# Patient Record
Sex: Female | Born: 1950 | Race: White | Hispanic: No | Marital: Married | Smoking: Former smoker
Health system: Southern US, Community
[De-identification: ages and names within clinical notes are randomized; demographics above are authoritative.]

## PROBLEM LIST (undated history)

## (undated) DIAGNOSIS — F329 Major depressive disorder, single episode, unspecified: Secondary | ICD-10-CM

## (undated) DIAGNOSIS — L570 Actinic keratosis: Secondary | ICD-10-CM

## (undated) DIAGNOSIS — C801 Malignant (primary) neoplasm, unspecified: Secondary | ICD-10-CM

## (undated) DIAGNOSIS — E079 Disorder of thyroid, unspecified: Secondary | ICD-10-CM

## (undated) DIAGNOSIS — R7303 Prediabetes: Secondary | ICD-10-CM

## (undated) DIAGNOSIS — K146 Glossodynia: Secondary | ICD-10-CM

## (undated) DIAGNOSIS — I10 Essential (primary) hypertension: Secondary | ICD-10-CM

## (undated) DIAGNOSIS — F419 Anxiety disorder, unspecified: Secondary | ICD-10-CM

## (undated) DIAGNOSIS — C4492 Squamous cell carcinoma of skin, unspecified: Secondary | ICD-10-CM

## (undated) DIAGNOSIS — G4730 Sleep apnea, unspecified: Secondary | ICD-10-CM

## (undated) DIAGNOSIS — K219 Gastro-esophageal reflux disease without esophagitis: Secondary | ICD-10-CM

## (undated) DIAGNOSIS — K589 Irritable bowel syndrome without diarrhea: Secondary | ICD-10-CM

## (undated) DIAGNOSIS — F32A Depression, unspecified: Secondary | ICD-10-CM

## (undated) DIAGNOSIS — I2510 Atherosclerotic heart disease of native coronary artery without angina pectoris: Secondary | ICD-10-CM

## (undated) DIAGNOSIS — C4491 Basal cell carcinoma of skin, unspecified: Secondary | ICD-10-CM

## (undated) DIAGNOSIS — M1990 Unspecified osteoarthritis, unspecified site: Secondary | ICD-10-CM

## (undated) DIAGNOSIS — E785 Hyperlipidemia, unspecified: Secondary | ICD-10-CM

## (undated) DIAGNOSIS — M797 Fibromyalgia: Secondary | ICD-10-CM

## (undated) HISTORY — DX: Basal cell carcinoma of skin, unspecified: C44.91

## (undated) HISTORY — DX: Squamous cell carcinoma of skin, unspecified: C44.92

## (undated) HISTORY — DX: Actinic keratosis: L57.0

## (undated) HISTORY — DX: Disorder of thyroid, unspecified: E07.9

## (undated) HISTORY — DX: Anxiety disorder, unspecified: F41.9

## (undated) HISTORY — DX: Glossodynia: K14.6

## (undated) HISTORY — DX: Depression, unspecified: F32.A

## (undated) HISTORY — DX: Irritable bowel syndrome, unspecified: K58.9

## (undated) HISTORY — DX: Unspecified osteoarthritis, unspecified site: M19.90

## (undated) HISTORY — DX: Hyperlipidemia, unspecified: E78.5

## (undated) HISTORY — DX: Major depressive disorder, single episode, unspecified: F32.9

## (undated) HISTORY — DX: Gastro-esophageal reflux disease without esophagitis: K21.9

## (undated) HISTORY — PX: BREAST BIOPSY: SHX20

## (undated) HISTORY — DX: Malignant (primary) neoplasm, unspecified: C80.1

## (undated) HISTORY — DX: Essential (primary) hypertension: I10

## (undated) HISTORY — DX: Fibromyalgia: M79.7

## (undated) HISTORY — PX: EYE SURGERY: SHX253

## (undated) HISTORY — DX: Atherosclerotic heart disease of native coronary artery without angina pectoris: I25.10

---

## 2016-09-18 DIAGNOSIS — F39 Unspecified mood [affective] disorder: Secondary | ICD-10-CM | POA: Diagnosis not present

## 2016-09-18 DIAGNOSIS — F411 Generalized anxiety disorder: Secondary | ICD-10-CM | POA: Diagnosis not present

## 2016-09-18 DIAGNOSIS — Z79899 Other long term (current) drug therapy: Secondary | ICD-10-CM | POA: Diagnosis not present

## 2016-09-18 DIAGNOSIS — F4312 Post-traumatic stress disorder, chronic: Secondary | ICD-10-CM | POA: Diagnosis not present

## 2016-09-18 DIAGNOSIS — F5105 Insomnia due to other mental disorder: Secondary | ICD-10-CM | POA: Diagnosis not present

## 2016-09-25 DIAGNOSIS — F4312 Post-traumatic stress disorder, chronic: Secondary | ICD-10-CM | POA: Diagnosis not present

## 2016-09-25 DIAGNOSIS — F39 Unspecified mood [affective] disorder: Secondary | ICD-10-CM | POA: Diagnosis not present

## 2016-09-25 DIAGNOSIS — Z79899 Other long term (current) drug therapy: Secondary | ICD-10-CM | POA: Diagnosis not present

## 2016-10-05 DIAGNOSIS — F411 Generalized anxiety disorder: Secondary | ICD-10-CM | POA: Diagnosis not present

## 2016-10-05 DIAGNOSIS — F4312 Post-traumatic stress disorder, chronic: Secondary | ICD-10-CM | POA: Diagnosis not present

## 2016-10-05 DIAGNOSIS — F39 Unspecified mood [affective] disorder: Secondary | ICD-10-CM | POA: Diagnosis not present

## 2016-10-08 DIAGNOSIS — F39 Unspecified mood [affective] disorder: Secondary | ICD-10-CM | POA: Diagnosis not present

## 2016-10-08 DIAGNOSIS — F411 Generalized anxiety disorder: Secondary | ICD-10-CM | POA: Diagnosis not present

## 2016-10-08 DIAGNOSIS — F4312 Post-traumatic stress disorder, chronic: Secondary | ICD-10-CM | POA: Diagnosis not present

## 2016-10-08 DIAGNOSIS — Z79899 Other long term (current) drug therapy: Secondary | ICD-10-CM | POA: Diagnosis not present

## 2016-10-08 DIAGNOSIS — F5105 Insomnia due to other mental disorder: Secondary | ICD-10-CM | POA: Diagnosis not present

## 2016-10-21 DIAGNOSIS — F4312 Post-traumatic stress disorder, chronic: Secondary | ICD-10-CM | POA: Diagnosis not present

## 2016-10-21 DIAGNOSIS — F39 Unspecified mood [affective] disorder: Secondary | ICD-10-CM | POA: Diagnosis not present

## 2016-10-21 DIAGNOSIS — F411 Generalized anxiety disorder: Secondary | ICD-10-CM | POA: Diagnosis not present

## 2016-10-27 ENCOUNTER — Encounter: Payer: Self-pay | Admitting: Family Medicine

## 2016-10-27 ENCOUNTER — Ambulatory Visit (INDEPENDENT_AMBULATORY_CARE_PROVIDER_SITE_OTHER): Payer: Medicare Other | Admitting: Family Medicine

## 2016-10-27 VITALS — BP 102/80 | HR 100 | Temp 98.7°F | Ht 62.0 in | Wt 170.4 lb

## 2016-10-27 DIAGNOSIS — E785 Hyperlipidemia, unspecified: Secondary | ICD-10-CM | POA: Diagnosis not present

## 2016-10-27 DIAGNOSIS — E1169 Type 2 diabetes mellitus with other specified complication: Secondary | ICD-10-CM | POA: Insufficient documentation

## 2016-10-27 DIAGNOSIS — F4310 Post-traumatic stress disorder, unspecified: Secondary | ICD-10-CM | POA: Diagnosis not present

## 2016-10-27 DIAGNOSIS — R946 Abnormal results of thyroid function studies: Secondary | ICD-10-CM

## 2016-10-27 DIAGNOSIS — I10 Essential (primary) hypertension: Secondary | ICD-10-CM | POA: Diagnosis not present

## 2016-10-27 DIAGNOSIS — R7989 Other specified abnormal findings of blood chemistry: Secondary | ICD-10-CM

## 2016-10-27 LAB — T3, FREE: T3, Free: 3.2 pg/mL (ref 2.3–4.2)

## 2016-10-27 LAB — T4, FREE: Free T4: 0.58 ng/dL — ABNORMAL LOW (ref 0.60–1.60)

## 2016-10-27 LAB — TSH: TSH: 2.85 u[IU]/mL (ref 0.35–4.50)

## 2016-10-27 NOTE — Assessment & Plan Note (Signed)
-  Continue Lipitor °

## 2016-10-27 NOTE — Assessment & Plan Note (Signed)
Patient with PTSD, anxiety, and occasional depression. Followed by psychiatry. No SI or HI. She'll continue to follow with psychiatry.

## 2016-10-27 NOTE — Assessment & Plan Note (Signed)
Found on recent lab work. Check labs as outlined below.

## 2016-10-27 NOTE — Assessment & Plan Note (Signed)
At goal. Continue current medications. Recent labs reviewed.

## 2016-10-27 NOTE — Patient Instructions (Signed)
Nice to meet you. We'll check lab work for your thyroid today and contact you with the results. Please continue to follow with Dr. Nicolasa Ducking. When you need refills please let us know.
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
## 2016-10-29 ENCOUNTER — Other Ambulatory Visit: Payer: Self-pay | Admitting: Family Medicine

## 2016-10-29 DIAGNOSIS — R7989 Other specified abnormal findings of blood chemistry: Secondary | ICD-10-CM

## 2016-11-04 DIAGNOSIS — F39 Unspecified mood [affective] disorder: Secondary | ICD-10-CM | POA: Diagnosis not present

## 2016-11-04 DIAGNOSIS — Z79899 Other long term (current) drug therapy: Secondary | ICD-10-CM | POA: Diagnosis not present

## 2016-11-04 DIAGNOSIS — F5105 Insomnia due to other mental disorder: Secondary | ICD-10-CM | POA: Diagnosis not present

## 2016-11-04 DIAGNOSIS — F411 Generalized anxiety disorder: Secondary | ICD-10-CM | POA: Diagnosis not present

## 2016-11-04 DIAGNOSIS — F4312 Post-traumatic stress disorder, chronic: Secondary | ICD-10-CM | POA: Diagnosis not present

## 2016-11-12 ENCOUNTER — Telehealth: Payer: Self-pay | Admitting: Family Medicine

## 2016-11-12 NOTE — Telephone Encounter (Signed)
-----   Message from Chauncey Mann, MD sent at 11/11/2016  7:14 PM EDT ----- I started to titrate Nicole Washington off Klonopin which has gone smoothly as her anxiety is much better controlled on Depakote and Cymbalta. She is not having withdrawl symptoms. I had always thought the Klonopin was for anxiety but she is now telling me it is prescribed for something called "Burning Mouth Syndrome". I am not sure about how this is treated but I feel like it falls out of my specialty. I don't know if ENT is who she should se?Marland Kitchen She says she is not having any anxiety and is stable in terms of her mood. Thought is was best to not be on Klonopin because of age, fall risk and no longer needing it.  I told her that I did not need to know

## 2016-11-12 NOTE — Telephone Encounter (Signed)
Please check with patient to see if she would like a referral to ENT for burning mouth syndrome as outlined in the note from the patient's psychiatrist. Thanks.

## 2016-11-13 ENCOUNTER — Encounter: Payer: Self-pay | Admitting: Family Medicine

## 2016-11-13 ENCOUNTER — Ambulatory Visit (INDEPENDENT_AMBULATORY_CARE_PROVIDER_SITE_OTHER): Payer: Medicare Other | Admitting: Family Medicine

## 2016-11-13 VITALS — BP 120/72 | HR 96 | Temp 98.3°F | Wt 171.2 lb

## 2016-11-13 DIAGNOSIS — K1379 Other lesions of oral mucosa: Secondary | ICD-10-CM | POA: Diagnosis not present

## 2016-11-13 DIAGNOSIS — K146 Glossodynia: Secondary | ICD-10-CM

## 2016-11-13 DIAGNOSIS — G8929 Other chronic pain: Secondary | ICD-10-CM | POA: Diagnosis not present

## 2016-11-13 DIAGNOSIS — M545 Low back pain, unspecified: Secondary | ICD-10-CM

## 2016-11-13 NOTE — Progress Notes (Signed)
  Tommi Rumps, MD Phone: [phone]  Nicole Washington is a 66 y.o. female who presents today for follow-up.  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 An oral surgeon advised her to be on Klonopin for this. This is beneficial. Notes it comes and goes when he wants to. Her psychiatrist has been tapering her off of Klonopin. She has done okay with this though she's had more frequent episodes of burning mouth. The patient initially was taking it once or twice a day very infrequently for the burning mouth syndrome though she started to take it for her anxiety as well.  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 Does have pain in her low back at times. No radiation. No numbness or weakness. No loss of bowel or bladder function. No saddle anesthesia. Has not done physical therapy for this. Has gotten injections previously.  PMH: nonsmoker.   ROS see history of present illness  Objective  Physical Exam Vitals:   11/13/16 1327  BP: 120/72  Pulse: 96  Temp: 98.3 F (36.8 C)  SpO2: 97%    BP Readings from Last 3 Encounters:  11/13/16 120/72  10/27/16 102/80   Wt Readings from Last 3 Encounters:  11/13/16 171 lb 3.2 oz (77.7 kg)  10/27/16 170 lb 6.4 oz (77.3 kg)    Physical Exam  Constitutional: No distress.  HENT:  Mouth/Throat: Oropharynx is clear and moist. No oropharyngeal exudate.  Cardiovascular: Normal rate, regular rhythm and normal heart sounds.   Pulmonary/Chest: Effort normal and breath sounds normal.  Musculoskeletal:  No midline spine tenderness, no midline spine step-off, no muscular back tenderness  Neurological: She is alert.  5/5 strength bilateral quads, hamstrings, plantar flexion, and dorsiflexion, sensation to light touch intact bilateral lower extremities  Skin: She is not diaphoretic.     Assessment/Plan: Please see individual problem list.  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 No prior lab workup.  We'll obtain lab work with her next set of labs in several weeks. She will complete the taper off of Klonopin as prescribed by her psychiatrist and then we will prescribe this for her to use as needed for burning mouth syndrome. She will not use this daily. If she starts to use it daily we will need to consider alternative treatment.  Chronic low back pain Chronic issue. Neurologically intact in lower extremities. Refer to physical therapy.   Orders Placed This Encounter  Procedures  . B12    Standing Status:   Future    Standing Expiration Date:   11/13/2017  . Folate    Standing Status:   Future    Standing Expiration Date:   11/13/2017  . Zinc    Standing Status:   Future    Standing Expiration Date:   11/13/2017  . Ambulatory referral to Physical Therapy    Referral Priority:   Routine    Referral Type:   Physical Medicine    Referral Reason:   Specialty Services Required    Requested Specialty:   Physical Therapy    Number of Visits Requested:   1    Tommi Rumps, MD [place]

## 2016-11-13 NOTE — Assessment & Plan Note (Signed)
Chronic issue. Neurologically intact in lower extremities. Refer to physical therapy.

## 2016-11-13 NOTE — Assessment & Plan Note (Signed)
Chronic history of this. No prior lab workup. We'll obtain lab work with her next set of labs in several weeks. She will complete the taper off of Klonopin as prescribed by her psychiatrist and then we will prescribe this for her to use as needed for burning mouth syndrome. She will not use this daily. If she starts to use it daily we will need to consider alternative treatment.

## 2016-11-13 NOTE — Patient Instructions (Signed)
Nice to see you. Please contact us when you finish the taper and then we will prescribe a prescription for Klonopin to use as needed when your mouth is burning. We'll get lab work when you return to have her thyroid function checked as well.

## 2016-11-13 NOTE — Telephone Encounter (Signed)
Patient does not want to see ent

## 2016-11-16 ENCOUNTER — Telehealth: Payer: Self-pay | Admitting: Family Medicine

## 2016-11-16 DIAGNOSIS — M545 Low back pain: Principal | ICD-10-CM

## 2016-11-16 DIAGNOSIS — G8929 Other chronic pain: Secondary | ICD-10-CM

## 2016-11-16 NOTE — Telephone Encounter (Signed)
Please advise 

## 2016-11-16 NOTE — Telephone Encounter (Signed)
Pt called wanting to see a Orthopedic doctor in [place]. It's the L1-L4 bottom on spine. Referral needed.   Call pt @ [phone]. Thankyou!

## 2016-11-17 NOTE — Telephone Encounter (Signed)
Referral placed.

## 2016-11-18 ENCOUNTER — Ambulatory Visit: Payer: Self-pay | Admitting: Family Medicine

## 2016-11-18 DIAGNOSIS — F4312 Post-traumatic stress disorder, chronic: Secondary | ICD-10-CM | POA: Diagnosis not present

## 2016-11-18 DIAGNOSIS — F411 Generalized anxiety disorder: Secondary | ICD-10-CM | POA: Diagnosis not present

## 2016-11-18 DIAGNOSIS — F39 Unspecified mood [affective] disorder: Secondary | ICD-10-CM | POA: Diagnosis not present

## 2016-11-24 DIAGNOSIS — M545 Low back pain: Secondary | ICD-10-CM | POA: Diagnosis not present

## 2016-12-02 DIAGNOSIS — M545 Low back pain: Secondary | ICD-10-CM | POA: Diagnosis not present

## 2016-12-04 DIAGNOSIS — M545 Low back pain: Secondary | ICD-10-CM | POA: Diagnosis not present

## 2016-12-09 ENCOUNTER — Other Ambulatory Visit (INDEPENDENT_AMBULATORY_CARE_PROVIDER_SITE_OTHER): Payer: Medicare Other

## 2016-12-09 DIAGNOSIS — K146 Glossodynia: Secondary | ICD-10-CM | POA: Diagnosis not present

## 2016-12-09 DIAGNOSIS — K1379 Other lesions of oral mucosa: Secondary | ICD-10-CM | POA: Diagnosis not present

## 2016-12-09 DIAGNOSIS — R7989 Other specified abnormal findings of blood chemistry: Secondary | ICD-10-CM

## 2016-12-09 LAB — T4, FREE: FREE T4: 0.7 ng/dL (ref 0.60–1.60)

## 2016-12-09 LAB — T3, FREE: T3 FREE: 2.9 pg/mL (ref 2.3–4.2)

## 2016-12-09 LAB — VITAMIN B12: VITAMIN B 12: 595 pg/mL (ref 211–911)

## 2016-12-09 LAB — TSH: TSH: 2.14 u[IU]/mL (ref 0.35–4.50)

## 2016-12-09 LAB — FOLATE: FOLATE: 17.4 ng/mL (ref >5.9)

## 2016-12-11 LAB — ZINC: ZINC: 92 ug/dL (ref 60–130)

## 2016-12-14 ENCOUNTER — Encounter: Payer: Self-pay | Admitting: *Deleted

## 2016-12-23 ENCOUNTER — Telehealth: Payer: Self-pay | Admitting: Family Medicine

## 2016-12-23 NOTE — Telephone Encounter (Signed)
Copied from [place] [phone]. Topic: Quick Communication - See Telephone Encounter >> Dec 23, 2016  4:05 PM Boyd Kerbs wrote: CRM for notification. See Telephone encounter for:  pt calling for Lab results. She is in the middle of a move (put in new address) but letter went to old address in [place]. Asking for nurse to call her with results, please  12/23/16.

## 2016-12-24 DIAGNOSIS — F39 Unspecified mood [affective] disorder: Secondary | ICD-10-CM | POA: Diagnosis not present

## 2016-12-24 DIAGNOSIS — F4312 Post-traumatic stress disorder, chronic: Secondary | ICD-10-CM | POA: Diagnosis not present

## 2016-12-24 DIAGNOSIS — F411 Generalized anxiety disorder: Secondary | ICD-10-CM | POA: Diagnosis not present

## 2016-12-24 DIAGNOSIS — F5105 Insomnia due to other mental disorder: Secondary | ICD-10-CM | POA: Diagnosis not present

## 2016-12-25 NOTE — Telephone Encounter (Signed)
Left message to return call 

## 2016-12-28 DIAGNOSIS — F39 Unspecified mood [affective] disorder: Secondary | ICD-10-CM | POA: Diagnosis not present

## 2016-12-28 DIAGNOSIS — F411 Generalized anxiety disorder: Secondary | ICD-10-CM | POA: Diagnosis not present

## 2016-12-28 DIAGNOSIS — F4312 Post-traumatic stress disorder, chronic: Secondary | ICD-10-CM | POA: Diagnosis not present

## 2017-01-14 ENCOUNTER — Telehealth: Payer: Self-pay | Admitting: Family Medicine

## 2017-01-14 MED ORDER — CLONAZEPAM 0.5 MG PO TABS: 0.2500 mg | ORAL_TABLET | Freq: Every day | ORAL | 0 refills | Status: DC | PRN

## 2017-01-14 NOTE — Telephone Encounter (Signed)
Please fax

## 2017-01-14 NOTE — Telephone Encounter (Signed)
Copied from [place]. Topic: General - Other >> Jan 14, 2017 11:02 AM Yvette Rack wrote: Reason for CRM: med refill on Klonopin 0.5mg  [facility] [address] patient has moved back here in October since her house has been built Please take out the [facility] in [place] / [place] 11-13-16 with Dr. Caryl Bis / Request refill on Klonopin / [facility] [address] In [place]

## 2017-01-14 NOTE — Addendum Note (Signed)
Addended by: Leone Haven on: 01/14/2017 04:08 PM   Modules accepted: Orders

## 2017-01-14 NOTE — Telephone Encounter (Signed)
Last OV- 11/13/16 Next OV- 02/02/18 Last refill- 10/27/16  Ok to refill?

## 2017-01-15 NOTE — Telephone Encounter (Signed)
faxed

## 2017-01-18 NOTE — Telephone Encounter (Signed)
Spoke to pharmacist and he verified that patient picked up rx on 01/16/17

## 2017-01-18 NOTE — Telephone Encounter (Signed)
Patient calling back again today and states the pharmacy still hasn't received the fax. The pharmacy recommend for the Dr or CMA to call the pharmacy and speak with a person to get this medication refilled. Phone number [phone]

## 2017-01-18 NOTE — Telephone Encounter (Signed)
Please advise 

## 2017-02-01 DIAGNOSIS — F411 Generalized anxiety disorder: Secondary | ICD-10-CM | POA: Diagnosis not present

## 2017-02-01 DIAGNOSIS — F4312 Post-traumatic stress disorder, chronic: Secondary | ICD-10-CM | POA: Diagnosis not present

## 2017-02-01 DIAGNOSIS — F39 Unspecified mood [affective] disorder: Secondary | ICD-10-CM | POA: Diagnosis not present

## 2017-02-02 ENCOUNTER — Ambulatory Visit (INDEPENDENT_AMBULATORY_CARE_PROVIDER_SITE_OTHER): Payer: Medicare Other | Admitting: Family Medicine

## 2017-02-02 ENCOUNTER — Other Ambulatory Visit: Payer: Self-pay

## 2017-02-02 ENCOUNTER — Encounter: Payer: Self-pay | Admitting: Family Medicine

## 2017-02-02 VITALS — BP 112/80 | HR 93 | Temp 98.0°F | Wt 170.6 lb

## 2017-02-02 DIAGNOSIS — I10 Essential (primary) hypertension: Secondary | ICD-10-CM | POA: Diagnosis not present

## 2017-02-02 DIAGNOSIS — K146 Glossodynia: Secondary | ICD-10-CM | POA: Diagnosis not present

## 2017-02-02 DIAGNOSIS — I2510 Atherosclerotic heart disease of native coronary artery without angina pectoris: Secondary | ICD-10-CM | POA: Diagnosis not present

## 2017-02-02 DIAGNOSIS — Z23 Encounter for immunization: Secondary | ICD-10-CM

## 2017-02-02 DIAGNOSIS — N951 Menopausal and female climacteric states: Secondary | ICD-10-CM | POA: Diagnosis not present

## 2017-02-02 DIAGNOSIS — E785 Hyperlipidemia, unspecified: Secondary | ICD-10-CM

## 2017-02-02 MED ORDER — CLONAZEPAM 0.5 MG PO TABS: 0.7500 mg | ORAL_TABLET | Freq: Every day | ORAL | 0 refills | Status: DC | PRN

## 2017-02-02 NOTE — Patient Instructions (Signed)
Nice to see you. Please start checking your blood pressure daily.  Your goal is 130/80 or less.  You may discontinue the amlodipine though if your blood pressure starts to creep up please restart it.  We will have you return in 2 weeks for recheck. We can trial Klonopin 0.75 mg at a time for your burning mouth syndrome.

## 2017-02-02 NOTE — Assessment & Plan Note (Signed)
Suspect related to menopause.  Prior thyroid function normal.  Discussed trial of medication for this though she declined.  She will monitor.

## 2017-02-02 NOTE — Assessment & Plan Note (Addendum)
Has achieved some level of control with her current dosing of Klonopin as needed.  We will slightly increase the dose to see if this will provide better control.  She will monitor and if not beneficial she will let us know.

## 2017-02-02 NOTE — Assessment & Plan Note (Signed)
Patient states she has a history of CAD.  Asymptomatic.  Currently taking aspirin and Lipitor.  Monitor for symptoms.

## 2017-02-02 NOTE — Assessment & Plan Note (Signed)
Uncontrolled.  Discussed a trial of discontinuing her amlodipine.  She wants to do this in the new year.  She will check her blood pressure while doing this and if it is above a goal of 130/80 consistently she will start back on the amlodipine and let us know.  She will return 2 weeks after discontinuing the amlodipine for BP check with nursing.

## 2017-02-02 NOTE — Progress Notes (Signed)
Tommi Rumps, MD Phone: [phone]  Nicole Washington is a 66 y.o. female who presents today for follow-up.  Hypertension: Not checking at home.  Taking amlodipine, losartan, Coreg.  No chest pain, shortness of breath, or edema.  Notes rare lightheadedness when she goes to stand up.  It occurs infrequently. She is interested in trying to come off of BP medications.   Burning mouth syndrome: Has been diagnosed with this previously.  She notes she was able to start on Klonopin as needed for this.  She still has 1 pill left from when I filled it earlier.  Notes this helps symptoms go away when she takes it or makes it tolerable.  Notes there are some days where it does not bother her at all.  She was previously on a much higher dose.  Describes it as dry mouth and burning sensation. She thinks that she would benefit from a slightly higher dose to help with symptoms.   Hyperlipidemia: Taking Lipitor.  No myalgias.  Occasionally she will get a sharp/dull twinge in her right upper quadrant.  It lasts briefly and goes away on its own.  Occurs about once a week.  Not elicited by food. No other associated symptoms.   Patient is postmenopausal and notes hot flashes.  She notes she seems to run hot most of the time as well.  Prior thyroid function testing was normal.  Social History   Tobacco Use  Smoking Status Never Smoker  Smokeless Tobacco Never Used     ROS see history of present illness  Objective  Physical Exam Vitals:   02/02/17 1354  BP: 112/80  Pulse: 93  Temp: 98 F (36.7 C)  SpO2: 97%   Laying blood pressure 125/85 pulse 88 Sitting blood pressure 124/82 pulse 90 Standing blood pressure 119/81 pulse 92  BP Readings from Last 3 Encounters:  02/02/17 112/80  11/13/16 120/72  10/27/16 102/80   Wt Readings from Last 3 Encounters:  02/02/17 170 lb 9.6 oz (77.4 kg)  11/13/16 171 lb 3.2 oz (77.7 kg)  10/27/16 170 lb 6.4 oz (77.3 kg)    Physical Exam  Constitutional: No  distress.  Cardiovascular: Normal rate, regular rhythm and normal heart sounds.  Pulmonary/Chest: Effort normal and breath sounds normal.  Abdominal: Soft. Bowel sounds are normal. She exhibits no distension. There is no tenderness. There is no rebound and no guarding.  Musculoskeletal: She exhibits no edema.  Neurological: She is alert. Gait normal.  Skin: Skin is warm and dry. She is not diaphoretic.     Assessment/Plan: Please see individual problem list.  Hypertension Uncontrolled.  Discussed a trial of discontinuing her amlodipine.  She wants to do this in the new year.  She will check her blood pressure while doing this and if it is above a goal of 130/80 consistently she will start back on the amlodipine and let us know.  She will return 2 weeks after discontinuing the amlodipine for BP check with nursing.  Burning mouth syndrome Has achieved some level of control with her current dosing of Klonopin as needed.  We will slightly increase the dose to see if this will provide better control.  She will monitor and if not beneficial she will let us know.  Hyperlipidemia She will return for fasting lipid panel.  Rarely has right upper quadrant twinges though has a benign exam.  We will check a CMP as well when she returns for the lipid panel.  If worsen she will be reevaluated sooner.  Hot flashes due to menopause Suspect related to menopause.  Prior thyroid function normal.  Discussed trial of medication for this though she declined.  She will monitor.  CAD (coronary artery disease) Patient states she has a history of CAD.  Asymptomatic.  Currently taking aspirin and Lipitor.  Monitor for symptoms.   Adiah was seen today for follow-up.  Diagnoses and all orders for this visit:  Hyperlipidemia, unspecified hyperlipidemia type -     Comp Met (CMET); Future -     Lipid panel; Future  Encounter for immunization -     Flu vaccine HIGH DOSE PF  Essential hypertension  Burning  mouth syndrome  Hot flashes due to menopause  Coronary artery disease involving native heart without angina pectoris, unspecified vessel or lesion type  Other orders -     clonazePAM (KLONOPIN) 0.5 MG tablet; Take 1.5 tablets (0.75 mg total) by mouth daily as needed (Burning mouth syndrome).    Orders Placed This Encounter  Procedures  . Flu vaccine HIGH DOSE PF  . Comp Met (CMET)    Standing Status:   Future    Standing Expiration Date:   02/02/2018  . Lipid panel    Standing Status:   Future    Standing Expiration Date:   02/02/2018    Meds ordered this encounter  Medications  . clonazePAM (KLONOPIN) 0.5 MG tablet    Sig: Take 1.5 tablets (0.75 mg total) by mouth daily as needed (Burning mouth syndrome).    Dispense:  30 tablet    Refill:  0     Tommi Rumps, MD [place]

## 2017-02-02 NOTE — Assessment & Plan Note (Signed)
She will return for fasting lipid panel.  Rarely has right upper quadrant twinges though has a benign exam.  We will check a CMP as well when she returns for the lipid panel.  If worsen she will be reevaluated sooner.

## 2017-02-17 DIAGNOSIS — F4312 Post-traumatic stress disorder, chronic: Secondary | ICD-10-CM | POA: Diagnosis not present

## 2017-02-17 DIAGNOSIS — F411 Generalized anxiety disorder: Secondary | ICD-10-CM | POA: Diagnosis not present

## 2017-02-17 DIAGNOSIS — F39 Unspecified mood [affective] disorder: Secondary | ICD-10-CM | POA: Diagnosis not present

## 2017-02-17 DIAGNOSIS — Z79899 Other long term (current) drug therapy: Secondary | ICD-10-CM | POA: Diagnosis not present

## 2017-02-17 DIAGNOSIS — F5105 Insomnia due to other mental disorder: Secondary | ICD-10-CM | POA: Diagnosis not present

## 2017-02-22 DIAGNOSIS — F4312 Post-traumatic stress disorder, chronic: Secondary | ICD-10-CM | POA: Diagnosis not present

## 2017-02-22 DIAGNOSIS — F411 Generalized anxiety disorder: Secondary | ICD-10-CM | POA: Diagnosis not present

## 2017-02-22 DIAGNOSIS — F39 Unspecified mood [affective] disorder: Secondary | ICD-10-CM | POA: Diagnosis not present

## 2017-02-26 ENCOUNTER — Encounter: Payer: Self-pay | Admitting: *Deleted

## 2017-02-26 ENCOUNTER — Ambulatory Visit (INDEPENDENT_AMBULATORY_CARE_PROVIDER_SITE_OTHER): Payer: Medicare Other | Admitting: *Deleted

## 2017-02-26 VITALS — BP 128/96 | HR 84 | Resp 18

## 2017-02-26 DIAGNOSIS — I10 Essential (primary) hypertension: Secondary | ICD-10-CM | POA: Diagnosis not present

## 2017-02-26 NOTE — Progress Notes (Signed)
Patient presented for 2 week BP check after stopping amlodipine 5 mg per last OV 02/02/17. BP taken in left arm 128/96 pulse 84. Patient also dropped off home reading given to [place].

## 2017-02-27 NOTE — Progress Notes (Signed)
Blood pressure uncontrolled off of amlodipine.  I would suggest restarting this.  Please also ask the patient to contact her pharmacy to see if the losartan she is on was part of the recent recall.  If it was she should let us know and we can change her medication.  Thanks.

## 2017-03-01 NOTE — Progress Notes (Signed)
Patient notified and voiced understanding to restarting amlodipine and to call ing pharmacy concerning losartan recall.

## 2017-03-08 DIAGNOSIS — F4312 Post-traumatic stress disorder, chronic: Secondary | ICD-10-CM | POA: Diagnosis not present

## 2017-03-08 DIAGNOSIS — Z79899 Other long term (current) drug therapy: Secondary | ICD-10-CM | POA: Diagnosis not present

## 2017-03-08 DIAGNOSIS — F39 Unspecified mood [affective] disorder: Secondary | ICD-10-CM | POA: Diagnosis not present

## 2017-03-08 DIAGNOSIS — F411 Generalized anxiety disorder: Secondary | ICD-10-CM | POA: Diagnosis not present

## 2017-03-08 DIAGNOSIS — F5105 Insomnia due to other mental disorder: Secondary | ICD-10-CM | POA: Diagnosis not present

## 2017-03-15 DIAGNOSIS — F4312 Post-traumatic stress disorder, chronic: Secondary | ICD-10-CM | POA: Diagnosis not present

## 2017-03-15 DIAGNOSIS — F411 Generalized anxiety disorder: Secondary | ICD-10-CM | POA: Diagnosis not present

## 2017-03-15 DIAGNOSIS — F39 Unspecified mood [affective] disorder: Secondary | ICD-10-CM | POA: Diagnosis not present

## 2017-03-26 DIAGNOSIS — M545 Low back pain: Secondary | ICD-10-CM | POA: Diagnosis not present

## 2017-03-26 DIAGNOSIS — G8929 Other chronic pain: Secondary | ICD-10-CM | POA: Diagnosis not present

## 2017-04-14 ENCOUNTER — Other Ambulatory Visit: Payer: Self-pay | Admitting: Family Medicine

## 2017-04-14 NOTE — Telephone Encounter (Signed)
Last OV 02/02/17 last filled 02/02/17 30 0rf Left message to return call to ask patient how many she has left and how often she takes it. [place] for pec to speak with patient

## 2017-04-15 NOTE — Telephone Encounter (Signed)
Patient returned call- she states she has no medication left.  She had her last prescription filled on 12/28 - #30. She only uses it as needed and doesn't take it every day. She states the Rx usually last her 2 months and she is actually having symptoms now that she is out. She would like to have a refill if possible.

## 2017-04-15 NOTE — Telephone Encounter (Signed)
Ok to refill. Please call this in to the pharmacy with the instructions from the prescription that I filled today in the system. Thanks.

## 2017-04-15 NOTE — Telephone Encounter (Signed)
Please advise on refill, I can call in to the pharmacy

## 2017-04-15 NOTE — Telephone Encounter (Signed)
Called into pharmacy

## 2017-04-20 ENCOUNTER — Telehealth: Payer: Self-pay | Admitting: Family Medicine

## 2017-04-20 NOTE — Telephone Encounter (Signed)
Please advise 

## 2017-04-20 NOTE — Telephone Encounter (Signed)
Copied from [place] [id number]. Topic: Quick Communication - See Telephone Encounter >> Apr 20, 2017 12:20 PM Aurelio Brash B wrote: CRM for notification. See Telephone encounter for:  Pt accidentally dropped her clonazePAM (KLONOPIN) 0.5 MG tablets down the sink drain.  She said she was able to retreive 7 of the pills.  If she can get this refilled again  she [facility]  [id number] [address], [place] [phone] (Phone) [phone] (Fax)    04/20/17.

## 2017-04-21 MED ORDER — CLONAZEPAM 0.5 MG PO TABS: ORAL_TABLET | ORAL | 1 refills | Status: DC

## 2017-04-21 NOTE — Telephone Encounter (Signed)
Pt was in the office wondering about her problem with her medication. Please advise.

## 2017-04-21 NOTE — Telephone Encounter (Signed)
Please advise 

## 2017-04-21 NOTE — Telephone Encounter (Signed)
Refill sent to pharmacy.   

## 2017-04-22 ENCOUNTER — Telehealth: Payer: Self-pay | Admitting: Family Medicine

## 2017-04-22 NOTE — Telephone Encounter (Signed)
Informed pharmacist it is ok to fill early

## 2017-04-22 NOTE — Telephone Encounter (Signed)
Copied from [place]. Topic: General - Other >> Apr 22, 2017 11:03 AM Cecelia Byars, NT wrote: Reason for CRM: The patient called and said the pharmacy will not fill her prescription for  clonazePAM (KLONOPIN) 0.5 MG tablet  ,they are saying it is because of the way the prescription was worded and sent in please  call the pharmacy at  [phone] to discuss this matter [facility] [place]

## 2017-05-03 ENCOUNTER — Other Ambulatory Visit: Payer: Self-pay

## 2017-05-03 ENCOUNTER — Ambulatory Visit (INDEPENDENT_AMBULATORY_CARE_PROVIDER_SITE_OTHER): Payer: Medicare Other | Admitting: Family Medicine

## 2017-05-03 ENCOUNTER — Encounter: Payer: Self-pay | Admitting: Family Medicine

## 2017-05-03 VITALS — BP 102/70 | HR 92 | Temp 98.3°F | Wt 172.0 lb

## 2017-05-03 DIAGNOSIS — E785 Hyperlipidemia, unspecified: Secondary | ICD-10-CM | POA: Diagnosis not present

## 2017-05-03 DIAGNOSIS — R253 Fasciculation: Secondary | ICD-10-CM | POA: Diagnosis not present

## 2017-05-03 DIAGNOSIS — Z1231 Encounter for screening mammogram for malignant neoplasm of breast: Secondary | ICD-10-CM | POA: Diagnosis not present

## 2017-05-03 DIAGNOSIS — J309 Allergic rhinitis, unspecified: Secondary | ICD-10-CM | POA: Insufficient documentation

## 2017-05-03 DIAGNOSIS — Z1239 Encounter for other screening for malignant neoplasm of breast: Secondary | ICD-10-CM

## 2017-05-03 DIAGNOSIS — K146 Glossodynia: Secondary | ICD-10-CM | POA: Diagnosis not present

## 2017-05-03 DIAGNOSIS — I10 Essential (primary) hypertension: Secondary | ICD-10-CM

## 2017-05-03 DIAGNOSIS — R413 Other amnesia: Secondary | ICD-10-CM | POA: Insufficient documentation

## 2017-05-03 NOTE — Assessment & Plan Note (Signed)
Well-controlled.  Continue as needed clonazepam. 

## 2017-05-03 NOTE — Patient Instructions (Signed)
Nice to see you. Please monitor your memory.  If it worsens or changes please let us know. We will have you return for lab work.  Please monitor the twitching.  If this continues to occur we could have you see neurology. Please start on Flonase for your postnasal drip and ear discomfort.

## 2017-05-03 NOTE — Assessment & Plan Note (Signed)
Return for fasting lab work.  Continue Lipitor.

## 2017-05-03 NOTE — Assessment & Plan Note (Signed)
Describes twitching intermittently.  Asymptomatic on exam.  Neurologically intact.  Will check electrolytes.  Consider neurology evaluation after electrolyte labs return.

## 2017-05-03 NOTE — Assessment & Plan Note (Signed)
Well-controlled.  She will return for fasting labs.

## 2017-05-03 NOTE — Progress Notes (Signed)
Tommi Rumps, MD Phone: [phone]  Nicole Washington is a 67 y.o. female who presents today for f/u.  HYPERTENSION  Disease Monitoring  Home BP Monitoring not checking Chest pain- no    Dyspnea- no Medications  Compliance-  Taking amlodipine, losartan, coreg.  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  Occasionally takes Klonopin with good benefit.  Hyperlipidemia: Taking Lipitor.  No right upper quadrant pain or myalgias.  She notes some memory issues with forgetting to do certain parts of the laundry and forgetting to take her medications at times.  No ADL or IADL issues.  She had 2 out of 3 word recall on mini cog with normal clock draw.  She reports a postnasal drip with rhinorrhea and left ear fullness at times.  This has been going on for years.  She does use Q-tips to clean her ears.  Some cough in the morning.  Does not consistently use Flonase.  She notes at times she will just twitch.  Can occur during sleep or when she is just sitting there.  Does not occur if she is moving around.  Has been going on for about 6 months.    Social History   Tobacco Use  Smoking Status Never Smoker  Smokeless Tobacco Never Used     ROS see history of present illness  Objective  Physical Exam Vitals:   05/03/17 1348  BP: 102/70  Pulse: 92  Temp: 98.3 F (36.8 C)  SpO2: 96%    BP Readings from Last 3 Encounters:  05/03/17 102/70  02/26/17 (!) 128/96  02/02/17 112/80   Wt Readings from Last 3 Encounters:  05/03/17 172 lb (78 kg)  02/02/17 170 lb 9.6 oz (77.4 kg)  11/13/16 171 lb 3.2 oz (77.7 kg)    Physical Exam  Constitutional: No distress.  HENT:  Mouth/Throat: Oropharynx is clear and moist. No oropharyngeal exudate.  Normal TMs bilaterally, slight abrasion of the inferior portion of the left ear canal  Eyes: Conjunctivae are normal. Pupils are equal, round, and reactive to light.  Cardiovascular: Normal rate, regular rhythm and normal heart sounds.    Pulmonary/Chest: Effort normal and breath sounds normal.  Musculoskeletal: She exhibits no edema.  Neurological: She is alert. Gait normal.  CN 2-12 intact, 5/5 strength in bilateral biceps, triceps, grip, quads, hamstrings, plantar and dorsiflexion, sensation to light touch intact in bilateral UE and LE  Skin: Skin is warm and dry. She is not diaphoretic.     Assessment/Plan: Please see individual problem list.  Hypertension Well-controlled.  She will return for fasting labs.  Burning mouth syndrome Well-controlled.  Continue as needed clonazepam.  Hyperlipidemia Return for fasting lab work.  Continue Lipitor.  Memory difficulty Possible age-related memory issues.  She passed the mini cog.  She will continue to monitor.  If worsen she will let us know.  Twitching Describes twitching intermittently.  Asymptomatic on exam.  Neurologically intact.  Will check electrolytes.  Consider neurology evaluation after electrolyte labs return.  Allergic rhinitis Suspect allergic rhinitis.  Discussed consistent use of Flonase.   Health Maintenance: Mammogram ordered.  She will track down her colonoscopy report for [place].  Orders Placed This Encounter  Procedures  . MM Digital Screening    Standing Status:   Future    Standing Expiration Date:   07/04/2018    Order Specific Question:   Reason for Exam (SYMPTOM  OR DIAGNOSIS REQUIRED)    Answer:   screening    Order Specific Question:  Preferred imaging location?    Answer:   [facility]    No orders of the defined types were placed in this encounter.    Tommi Rumps, MD [place]

## 2017-05-03 NOTE — Assessment & Plan Note (Signed)
Suspect allergic rhinitis.  Discussed consistent use of Flonase.

## 2017-05-03 NOTE — Assessment & Plan Note (Signed)
Possible age-related memory issues.  She passed the mini cog.  She will continue to monitor.  If worsen she will let us know.

## 2017-05-10 DIAGNOSIS — F411 Generalized anxiety disorder: Secondary | ICD-10-CM | POA: Diagnosis not present

## 2017-05-10 DIAGNOSIS — F4312 Post-traumatic stress disorder, chronic: Secondary | ICD-10-CM | POA: Diagnosis not present

## 2017-05-10 DIAGNOSIS — F39 Unspecified mood [affective] disorder: Secondary | ICD-10-CM | POA: Diagnosis not present

## 2017-05-13 ENCOUNTER — Other Ambulatory Visit (INDEPENDENT_AMBULATORY_CARE_PROVIDER_SITE_OTHER): Payer: Medicare Other

## 2017-05-13 DIAGNOSIS — E785 Hyperlipidemia, unspecified: Secondary | ICD-10-CM

## 2017-05-13 LAB — COMPREHENSIVE METABOLIC PANEL
ALBUMIN: 3.9 g/dL (ref 3.5–5.2)
ALT: 24 U/L (ref 0–35)
AST: 21 U/L (ref 0–37)
Alkaline Phosphatase: 77 U/L (ref 39–117)
BILIRUBIN TOTAL: 0.4 mg/dL (ref 0.2–1.2)
BUN: 17 mg/dL (ref 6–23)
CALCIUM: 9 mg/dL (ref 8.4–10.5)
CO2: 31 mEq/L (ref 19–32)
CREATININE: 0.88 mg/dL (ref 0.40–1.20)
Chloride: 100 mEq/L (ref 96–112)
GFR: 67.97 mL/min (ref >60.00)
Glucose, Bld: 106 mg/dL — ABNORMAL HIGH (ref 70–99)
Potassium: 3.7 mEq/L (ref 3.5–5.1)
Sodium: 140 mEq/L (ref 135–145)
Total Protein: 7.1 g/dL (ref 6.0–8.3)

## 2017-05-13 LAB — LIPID PANEL
CHOLESTEROL: 141 mg/dL (ref 0–200)
HDL: 46.3 mg/dL (ref >39.00)
LDL Cholesterol: 61 mg/dL (ref 0–99)
NonHDL: 94.5
TRIGLYCERIDES: 169 mg/dL — AB (ref 0.0–149.0)
Total CHOL/HDL Ratio: 3
VLDL: 33.8 mg/dL (ref 0.0–40.0)

## 2017-05-20 ENCOUNTER — Ambulatory Visit
Admission: RE | Admit: 2017-05-20 | Discharge: 2017-05-20 | Disposition: A | Discharge status: Home / Self Care | Payer: Medicare Other | Source: Ambulatory Visit | Attending: Family Medicine | Admitting: Family Medicine

## 2017-05-20 DIAGNOSIS — Z1231 Encounter for screening mammogram for malignant neoplasm of breast: Secondary | ICD-10-CM | POA: Insufficient documentation

## 2017-05-20 DIAGNOSIS — Z1239 Encounter for other screening for malignant neoplasm of breast: Secondary | ICD-10-CM

## 2017-05-25 ENCOUNTER — Telehealth: Payer: Self-pay

## 2017-05-25 NOTE — Telephone Encounter (Signed)
-----   Message from Leone Haven, MD sent at 05/25/2017 12:43 PM EDT ----- Regarding: mammo results I assume this patients mammogram is still in process because they are awaiting prior records. Please confirm this with [place]. Thanks. Randall Hiss.  ----- Message ----- From: SYSTEM Sent: 05/25/2017  12:07 AM To: Leone Haven, MD

## 2017-05-25 NOTE — Telephone Encounter (Signed)
Noted  

## 2017-05-25 NOTE — Telephone Encounter (Signed)
Nicole Washington is waiting on priors

## 2017-05-26 DIAGNOSIS — H2513 Age-related nuclear cataract, bilateral: Secondary | ICD-10-CM | POA: Diagnosis not present

## 2017-05-28 ENCOUNTER — Other Ambulatory Visit: Payer: Self-pay | Admitting: *Deleted

## 2017-05-28 ENCOUNTER — Inpatient Hospital Stay
Admission: RE | Admit: 2017-05-28 | Discharge: 2017-05-28 | Disposition: A | Discharge status: Home / Self Care | Payer: Self-pay | Source: Ambulatory Visit | Attending: *Deleted | Admitting: *Deleted

## 2017-05-28 DIAGNOSIS — Z9289 Personal history of other medical treatment: Secondary | ICD-10-CM

## 2017-06-14 DIAGNOSIS — F4312 Post-traumatic stress disorder, chronic: Secondary | ICD-10-CM | POA: Diagnosis not present

## 2017-06-14 DIAGNOSIS — F39 Unspecified mood [affective] disorder: Secondary | ICD-10-CM | POA: Diagnosis not present

## 2017-06-14 DIAGNOSIS — F411 Generalized anxiety disorder: Secondary | ICD-10-CM | POA: Diagnosis not present

## 2017-06-25 DIAGNOSIS — F39 Unspecified mood [affective] disorder: Secondary | ICD-10-CM | POA: Diagnosis not present

## 2017-06-25 DIAGNOSIS — F4312 Post-traumatic stress disorder, chronic: Secondary | ICD-10-CM | POA: Diagnosis not present

## 2017-06-25 DIAGNOSIS — Z79899 Other long term (current) drug therapy: Secondary | ICD-10-CM | POA: Diagnosis not present

## 2017-06-25 DIAGNOSIS — F5105 Insomnia due to other mental disorder: Secondary | ICD-10-CM | POA: Diagnosis not present

## 2017-06-25 DIAGNOSIS — F411 Generalized anxiety disorder: Secondary | ICD-10-CM | POA: Diagnosis not present

## 2017-10-09 ENCOUNTER — Other Ambulatory Visit: Payer: Self-pay | Admitting: Family Medicine

## 2017-10-12 NOTE — Telephone Encounter (Signed)
Last OV 05/03/17 last filled 04/21/17 30 1rf

## 2017-10-13 NOTE — Telephone Encounter (Signed)
Controlled substance database reviewed. Sent to pharmacy.   

## 2017-10-27 DIAGNOSIS — F411 Generalized anxiety disorder: Secondary | ICD-10-CM | POA: Diagnosis not present

## 2017-10-27 DIAGNOSIS — Z79899 Other long term (current) drug therapy: Secondary | ICD-10-CM | POA: Diagnosis not present

## 2017-10-27 DIAGNOSIS — F5105 Insomnia due to other mental disorder: Secondary | ICD-10-CM | POA: Diagnosis not present

## 2017-10-27 DIAGNOSIS — F4312 Post-traumatic stress disorder, chronic: Secondary | ICD-10-CM | POA: Diagnosis not present

## 2017-10-27 DIAGNOSIS — F39 Unspecified mood [affective] disorder: Secondary | ICD-10-CM | POA: Diagnosis not present

## 2017-11-03 ENCOUNTER — Ambulatory Visit (INDEPENDENT_AMBULATORY_CARE_PROVIDER_SITE_OTHER): Payer: Medicare Other | Admitting: Family Medicine

## 2017-11-03 VITALS — BP 128/78 | HR 89 | Temp 98.2°F | Resp 18 | Wt 183.0 lb

## 2017-11-03 DIAGNOSIS — K146 Glossodynia: Secondary | ICD-10-CM

## 2017-11-03 DIAGNOSIS — R253 Fasciculation: Secondary | ICD-10-CM

## 2017-11-03 DIAGNOSIS — R251 Tremor, unspecified: Secondary | ICD-10-CM | POA: Diagnosis not present

## 2017-11-03 DIAGNOSIS — Z1159 Encounter for screening for other viral diseases: Secondary | ICD-10-CM | POA: Diagnosis not present

## 2017-11-03 DIAGNOSIS — I10 Essential (primary) hypertension: Secondary | ICD-10-CM

## 2017-11-03 DIAGNOSIS — Z23 Encounter for immunization: Secondary | ICD-10-CM | POA: Diagnosis not present

## 2017-11-03 DIAGNOSIS — M6790 Unspecified disorder of synovium and tendon, unspecified site: Secondary | ICD-10-CM

## 2017-11-03 LAB — BASIC METABOLIC PANEL
BUN: 21 mg/dL (ref 6–23)
CALCIUM: 9.3 mg/dL (ref 8.4–10.5)
CO2: 32 mEq/L (ref 19–32)
CREATININE: 0.81 mg/dL (ref 0.40–1.20)
Chloride: 99 mEq/L (ref 96–112)
GFR: 74.69 mL/min (ref >60.00)
Glucose, Bld: 91 mg/dL (ref 70–99)
Potassium: 4.6 mEq/L (ref 3.5–5.1)
SODIUM: 138 meq/L (ref 135–145)

## 2017-11-03 LAB — TSH: TSH: 2.83 u[IU]/mL (ref 0.35–4.50)

## 2017-11-03 NOTE — Patient Instructions (Signed)
Nice to see you. We will refer you to sports medicine.  We will get lab work today. I am going to send a message to your psychiatrist and put on your tremors to see if your medication can be contributing.  If you do not hear anything from [place] by early next week regarding this please call us.

## 2017-11-03 NOTE — Progress Notes (Signed)
pt

## 2017-11-04 ENCOUNTER — Encounter: Payer: Self-pay | Admitting: Family Medicine

## 2017-11-04 DIAGNOSIS — M6790 Unspecified disorder of synovium and tendon, unspecified site: Secondary | ICD-10-CM | POA: Insufficient documentation

## 2017-11-04 LAB — HEPATITIS C ANTIBODY
Hepatitis C Ab: NONREACTIVE
SIGNAL TO CUT-OFF: 0.02 (ref <1.00)

## 2017-11-04 NOTE — Assessment & Plan Note (Signed)
Adequately controlled.  Continue current regimen. 

## 2017-11-04 NOTE — Assessment & Plan Note (Signed)
Refer to sports medicine to consider ultrasound.

## 2017-11-04 NOTE — Progress Notes (Signed)
Tommi Rumps, MD Phone: [phone]  Nicole Washington is a 67 y.o. female who presents today for f/u.  CC: Burning mouth syndrome, hypertension, tremor, lumps in palms  Mouth syndrome: Klonopin as needed has been helpful.  It does not make her drowsy.  No alcohol use with the Klonopin.  Symptoms have been well controlled.  Hypertension: Was 114/80 at psychiatry.  Taking amlodipine, carvedilol, losartan.  No chest pain, shortness of breath, or edema.  Patient has noted lumps in her bilateral palms over the last several years.  Occasionally there will be discomfort if she hits them.  They have not gotten bigger.  There is no catching with flexion of her fingers.  Tremor: Patient notes over the last few months she has noticed that her hands will tremor if she is trying to do something with them.  Typically occurs if she is trying to eat or hold a cup.  Has not worsened.  No new medication changes.  She does note the twitching she was having previously is still occurring though has not worsened.  Describes it as her leg just moving.  Social History   Tobacco Use  Smoking Status Never Smoker  Smokeless Tobacco Never Used     ROS see history of present illness  Objective  Physical Exam Vitals:   11/03/17 1320  BP: 128/78  Pulse: 89  Resp: 18  Temp: 98.2 F (36.8 C)  SpO2: 96%    BP Readings from Last 3 Encounters:  11/03/17 128/78  05/03/17 102/70  02/26/17 (!) 128/96   Wt Readings from Last 3 Encounters:  11/03/17 183 lb (83 kg)  05/03/17 172 lb (78 kg)  02/02/17 170 lb 9.6 oz (77.4 kg)    Physical Exam  Constitutional: No distress.  Cardiovascular: Normal rate, regular rhythm and normal heart sounds.  Pulmonary/Chest: Effort normal and breath sounds normal.  Musculoskeletal: She exhibits no edema.  Single nodule noted in bilateral palms over the flexor tendon of the fourth finger, no significant tenderness, no catching of the finger on flexion  Neurological: She is  alert.  CN 2-12 intact, 5/5 strength in bilateral biceps, triceps, grip, quads, hamstrings, plantar and dorsiflexion, sensation to light touch intact in bilateral UE and LE, normal gait, normal finger-to-nose, normal rapid alternating movements  Skin: Skin is warm and dry. She is not diaphoretic.     Assessment/Plan: Please see individual problem list.  Hypertension Adequately controlled.  Continue current regimen.  Burning mouth syndrome Adequately controlled.  Continue current regimen.  Tendon nodule Refer to sports medicine to consider ultrasound.  Twitching Patient continues to have this issue and has also had some tremor.  The tremor may be benign essential tremor.  It may be medication side effect.  I will send a message to her psychiatrist to get her input as it appears some of her psychiatric medications have these listed as side effects.  I advised the patient to contact us if she has not heard anything regarding this within the next week.  If the psychiatrist does not feel its medication related we will consider neurology referral.    Orders Placed This Encounter  Procedures  . Pneumococcal conjugate vaccine 13-valent  . Flu Vaccine QUAD 6+ mos PF IM (Fluarix Quad PF)  . TSH  . Hepatitis C Antibody  . Basic Metabolic Panel (BMET)  . Ambulatory referral to Sports Medicine    Referral Priority:   Routine    Referral Type:   Consultation    Number of Visits  Requested:   1    No orders of the defined types were placed in this encounter.    Tommi Rumps, MD [place]

## 2017-11-04 NOTE — Assessment & Plan Note (Signed)
Patient continues to have this issue and has also had some tremor.  The tremor may be benign essential tremor.  It may be medication side effect.  I will send a message to her psychiatrist to get her input as it appears some of her psychiatric medications have these listed as side effects.  I advised the patient to contact us if she has not heard anything regarding this within the next week.  If the psychiatrist does not feel its medication related we will consider neurology referral.

## 2017-11-08 ENCOUNTER — Telehealth: Payer: Self-pay | Admitting: Family Medicine

## 2017-11-08 NOTE — Telephone Encounter (Signed)
Called and spoke with pt. Pt advised and voiced understanding.  

## 2017-11-08 NOTE — Telephone Encounter (Signed)
-----   Message from Chauncey Mann, MD sent at 11/08/2017  7:33 AM EDT ----- Regarding: RE: Possible medication side effects   Thank you. I will followup with her.  ----- Message ----- From: Leone Haven, MD Sent: 11/04/2017   8:23 AM EDT To: Chauncey Mann, MD Subject: Possible medication side effects               Hi Dr Nicolasa Ducking,   I saw Mrs Nicole Washington yesterday for follow-up. She noted several months of a fine tremor when she is moving her hands while holding something. She noted no resting symptoms. It seems consistent with essential tremor. She has also noted issues with limb twitching where one of her limbs will just move for no reason. She has no apparent neurological abnormalities other than a fine intentional tremor. Looking at her depakote and Seroquel it looks like these could be potential side effects of those medications. I wanted to get your input to see if you thought her medications could be playing a role. Thanks for your help.  Randall Hiss

## 2017-11-08 NOTE — Telephone Encounter (Signed)
Please let the patient know that I heard back from her psychiatrist and she should be following up with the patient regarding her tremor as a possible side effect of medications. Thanks.

## 2017-11-10 DIAGNOSIS — F5105 Insomnia due to other mental disorder: Secondary | ICD-10-CM | POA: Diagnosis not present

## 2017-11-10 DIAGNOSIS — F39 Unspecified mood [affective] disorder: Secondary | ICD-10-CM | POA: Diagnosis not present

## 2017-11-10 DIAGNOSIS — F4312 Post-traumatic stress disorder, chronic: Secondary | ICD-10-CM | POA: Diagnosis not present

## 2017-11-10 DIAGNOSIS — F411 Generalized anxiety disorder: Secondary | ICD-10-CM | POA: Diagnosis not present

## 2017-11-14 NOTE — Progress Notes (Signed)
[facility] [address], [place] [address] Phone: [phone] Subjective:    I Nicole Washington am serving as a Education administrator for Dr. Hulan Saas.   I'm seeing this patient by the request  of:  Leone Haven, MD   CC: Bilateral hand pain  KKX:[id number]  Caylen Vanderweele is a 67 y.o. female coming in with complaint of bilateral hand pain. Has lumps in the palms of her hand. TTP. Right hand is worse than left. No numbness and tingling noted.   Onset- Chronic Location- Palm Character- Sharp Aggravating factors- TTP Reliving factors-not using the hands Therapies tried-nothing so far Severity-only with trying to grip something in 8 out of 10     Past Medical History:  Diagnosis Date  . CAD (coronary artery disease)   . Cancer ([place])    skin  . Depression   . GERD (gastroesophageal reflux disease)   . Hyperlipidemia   . Hypertension   . Thyroid disease    Past Surgical History:  Procedure Laterality Date  . BREAST BIOPSY Right    Neg  . CESAREAN SECTION     Social History   Socioeconomic History  . Marital status: Married    Spouse name: Not on file  . Number of children: Not on file  . Years of education: Not on file  . Highest education level: Not on file  Occupational History  . Not on file  Social Needs  . Financial resource strain: Not on file  . Food insecurity:    Worry: Not on file    Inability: Not on file  . Transportation needs:    Medical: Not on file    Non-medical: Not on file  Tobacco Use  . Smoking status: Never Smoker  . Smokeless tobacco: Never Used  Substance and Sexual Activity  . Alcohol use: Yes  . Drug use: No  . Sexual activity: Not on file  Lifestyle  . Physical activity:    Days per week: Not on file    Minutes per session: Not on file  . Stress: Not on file  Relationships  . Social connections:    Talks on phone: Not on file    Gets together: Not on file    Attends religious service: Not  on file    Active member of club or organization: Not on file    Attends meetings of clubs or organizations: Not on file    Relationship status: Not on file  Other Topics Concern  . Not on file  Social History Narrative  . Not on file   Allergies  Allergen Reactions  . Codeine Itching  . Tylenol [Acetaminophen] Itching   Family History  Problem Relation Age of Onset  . Sudden death Mother   . Breast cancer Neg Hx      Current Outpatient Medications (Cardiovascular):  .  amLODipine (NORVASC) 5 MG tablet, Take 5 mg by mouth daily. Marland Kitchen  atorvastatin (LIPITOR) 40 MG tablet, Take 40 mg by mouth daily. .  carvedilol (COREG) 3.125 MG tablet, Take 3.125 mg by mouth 2 (two) times daily with a meal. .  losartan (COZAAR) 25 MG tablet, Take 25 mg by mouth daily.   Current Outpatient Medications (Analgesics):  .  aspirin EC 81 MG tablet, Take 81 mg by mouth daily.   Current Outpatient Medications (Other):  .  clonazePAM (KLONOPIN) 0.5 MG tablet, TAKE 1.5 TABLETS BY MOUTH DAILY AS NEEDED .  divalproex (DEPAKOTE) 500 MG DR  tablet, Take 1,000 mg by mouth at bedtime. .  DULoxetine (CYMBALTA) 60 MG capsule, Take 60 mg by mouth 2 (two) times daily. .  QUEtiapine (SEROQUEL) 50 MG tablet, Take 50 mg by mouth at bedtime. .  ranitidine (ZANTAC) 300 MG tablet, Take 300 mg by mouth at bedtime.    Past medical history, social, surgical and family history all reviewed in electronic medical record.  No pertanent information unless stated regarding to the chief complaint.   Review of Systems:  No headache, visual changes, nausea, vomiting, diarrhea, constipation, dizziness, abdominal pain, skin rash, fevers, chills, night sweats, weight loss, swollen lymph nodes, body aches, joint swelling, muscle aches, chest pain, shortness of breath, mood changes.   Objective  Blood pressure 102/62, pulse 91, height 5\' 2"  (1.575 m), SpO2 95 %.    General: No apparent distress alert and oriented x3 mood and  affect normal, dressed appropriately.  HEENT: Pupils equal, extraocular movements intact  Respiratory: Patient's speak in full sentences and does not appear short of breath  Cardiovascular: No lower extremity edema, non tender, no erythema  Skin: Warm dry intact with no signs of infection or rash on extremities or on axial skeleton.  Abdomen: Soft nontender  Neuro: Cranial nerves II through XII are intact, neurovascularly intact in all extremities with 2+ DTRs and 2+ pulses.  Lymph: No lymphadenopathy of posterior or anterior cervical chain or axillae bilaterally.  Gait normal with good balance and coordination.  MSK:  tender with full range of motion and good stability and symmetric strength and tone of shoulders, elbows, wrist, hip, knee and ankles bilaterally.  Mild arthritic changes of multiple joints And exam shows the patient does have a nodule noted over the A2 pulley along the ring finger of the right hand and a very small one on the left hand.  No triggering noted.  Does have an overlying cystic mass also noted over the fourth finger.  Limited musculoskeletal ultrasound was performed and interpreted by Lyndal Pulley  Limited ultrasound shows that patient has had a trigger nodules noted over the flexor tendon sheath bilaterally right greater than left.  Patient also has a superficial cystic area above the trigger nodule on the right hand.  No abnormal vascularity     Impression and Recommendations:     This case required medical decision making of moderate complexity. The above documentation has been reviewed and is accurate and complete Lyndal Pulley, DO       Note: This dictation was prepared with Dragon dictation along with smaller phrase technology. Any transcriptional errors that result from this process are unintentional.

## 2017-11-15 ENCOUNTER — Ambulatory Visit: Payer: Self-pay

## 2017-11-15 ENCOUNTER — Encounter: Payer: Self-pay | Admitting: Family Medicine

## 2017-11-15 ENCOUNTER — Ambulatory Visit (INDEPENDENT_AMBULATORY_CARE_PROVIDER_SITE_OTHER): Payer: Medicare Other | Admitting: Family Medicine

## 2017-11-15 VITALS — BP 102/62 | HR 91 | Ht 62.0 in

## 2017-11-15 DIAGNOSIS — M79641 Pain in right hand: Secondary | ICD-10-CM | POA: Diagnosis not present

## 2017-11-15 DIAGNOSIS — M79642 Pain in left hand: Secondary | ICD-10-CM

## 2017-11-15 DIAGNOSIS — M6790 Unspecified disorder of synovium and tendon, unspecified site: Secondary | ICD-10-CM

## 2017-11-15 NOTE — Assessment & Plan Note (Signed)
Bilateral.  We discussed icing regimen.  Discussed icing regimen and home exercises.  Discussed bracing and massage.  Worsening symptoms consider injection.  Follow-up 6 weeks

## 2017-11-15 NOTE — Patient Instructions (Signed)
  Good to see you  Ice is your friend Ice 20 minutes 2 times daily. Usually after activity and before bed. pennsaid pinkie amount topically 2 times daily as needed.  Wear braces at night Stay active See me again in 6 weeks just in case or if you want to try injection

## 2017-11-22 DIAGNOSIS — L821 Other seborrheic keratosis: Secondary | ICD-10-CM | POA: Diagnosis not present

## 2017-11-22 DIAGNOSIS — L814 Other melanin hyperpigmentation: Secondary | ICD-10-CM | POA: Diagnosis not present

## 2017-11-22 DIAGNOSIS — L853 Xerosis cutis: Secondary | ICD-10-CM | POA: Diagnosis not present

## 2017-11-22 DIAGNOSIS — Z85828 Personal history of other malignant neoplasm of skin: Secondary | ICD-10-CM | POA: Diagnosis not present

## 2017-11-22 DIAGNOSIS — I781 Nevus, non-neoplastic: Secondary | ICD-10-CM | POA: Diagnosis not present

## 2017-11-22 DIAGNOSIS — L820 Inflamed seborrheic keratosis: Secondary | ICD-10-CM | POA: Diagnosis not present

## 2017-12-02 DIAGNOSIS — Z79899 Other long term (current) drug therapy: Secondary | ICD-10-CM | POA: Diagnosis not present

## 2017-12-02 DIAGNOSIS — F5105 Insomnia due to other mental disorder: Secondary | ICD-10-CM | POA: Diagnosis not present

## 2017-12-02 DIAGNOSIS — F411 Generalized anxiety disorder: Secondary | ICD-10-CM | POA: Diagnosis not present

## 2017-12-02 DIAGNOSIS — F39 Unspecified mood [affective] disorder: Secondary | ICD-10-CM | POA: Diagnosis not present

## 2017-12-02 DIAGNOSIS — F4312 Post-traumatic stress disorder, chronic: Secondary | ICD-10-CM | POA: Diagnosis not present

## 2017-12-04 ENCOUNTER — Telehealth: Payer: Self-pay | Admitting: Family Medicine

## 2017-12-04 DIAGNOSIS — R253 Fasciculation: Secondary | ICD-10-CM

## 2017-12-04 NOTE — Telephone Encounter (Signed)
Please of the patient know that I heard from Dr. Jake Michaelis regarding their medication trials.  We will place a referral for the patient to see neurology for evaluation.  Thanks.

## 2017-12-04 NOTE — Telephone Encounter (Signed)
-----   Message from Chauncey Mann, MD sent at 12/02/2017  2:39 PM EDT ----- UPDATE:  So we did 2 independent trials to differentiate which of 2 meds could be contributing to tremors:  1) 1week off Depakote and still taking Seroquel - tremors still present 2) 1 week of taking Seroquel but off the Depakote - tremors still present  She is still having muscle twitches and tremors. I think neuro might be a good idea. She said you mentioned this.  Cephus Shelling

## 2017-12-07 NOTE — Telephone Encounter (Signed)
Called pt and left a detailed VM. CRM created and sent to [facility] pool.  

## 2017-12-08 ENCOUNTER — Encounter: Payer: Self-pay | Admitting: Neurology

## 2017-12-09 NOTE — Telephone Encounter (Signed)
Called and spoke with pt. Pt advised and voiced understanding. Pt stated that she has already heard back from Neurology for an appt.

## 2017-12-17 NOTE — Progress Notes (Signed)
Subjective:   Nicole Washington was seen in consultation in the movement disorder clinic at the request of Leone Haven, MD.  The evaluation is for tremor.  Tremor started approximately 6 months ago and involves "everything."  She describes some tremor in the hands but then also describes a jerking/twitching that is separate.  Twitches can occur in arms/legs/feet.    First mention twitching in her March, 2019 visit with her primary care physician   Tremor is most noticeable when using the hands.   There is a family hx of tremor in a maternal uncle, with PD.    Affected by caffeine:  No. (1-2 cups coffee/day) Affected by alcohol:   (drinks 1 glass wine q 6 months so unknown) Affected by stress:  Yes.   Affected by fatigue:  No. Spills soup if on spoon:  Yes.   Spills glass of liquid if full:  No. Affects ADL's (tying shoes, brushing teeth, etc):  No.  Current medications that may exacerbate tremor:  Depakote. Pt has been on VPA for 1 year Patient has tried to go off of Depakote for a week and did not seem to change tremor.  She then tried to go off of Seroquel for a week and that did not seem to change tremor.  Outside reports reviewed: lab reports, office notes, radiology reports and referral letter/letters.   Allergies  Allergen Reactions  . Codeine Itching  . Tylenol [Acetaminophen] Itching    Outpatient Encounter Medications as of 12/20/2017  Medication Sig  . amLODipine (NORVASC) 5 MG tablet Take 5 mg by mouth daily.  Marland Kitchen aspirin EC 81 MG tablet Take 81 mg by mouth daily.  Marland Kitchen atorvastatin (LIPITOR) 40 MG tablet Take 40 mg by mouth daily.  . carvedilol (COREG) 3.125 MG tablet Take 3.125 mg by mouth 2 (two) times daily with a meal.  . clonazePAM (KLONOPIN) 0.5 MG tablet TAKE 1.5 TABLETS BY MOUTH DAILY AS NEEDED (Patient taking differently: Take 0.5 mg by mouth as needed. )  . divalproex (DEPAKOTE) 500 MG DR tablet Take 1,000 mg by mouth at bedtime.  . DULoxetine (CYMBALTA) 60 MG  capsule Take 60 mg by mouth 2 (two) times daily.  Marland Kitchen losartan (COZAAR) 25 MG tablet Take 25 mg by mouth daily.  . QUEtiapine (SEROQUEL) 25 MG tablet Take 37.5 mg by mouth at bedtime.  . ranitidine (ZANTAC) 300 MG tablet Take 300 mg by mouth at bedtime.  . [DISCONTINUED] QUEtiapine (SEROQUEL) 50 MG tablet Take 50 mg by mouth at bedtime.   No facility-administered encounter medications on file as of 12/20/2017.     Past Medical History:  Diagnosis Date  . Burning mouth syndrome   . CAD (coronary artery disease)   . Cancer (HCC)    basal and squamous cell  . Depression   . GERD (gastroesophageal reflux disease)   . Hyperlipidemia   . Hypertension   . Thyroid disease     Past Surgical History:  Procedure Laterality Date  . BREAST BIOPSY Right    Neg  . CESAREAN SECTION      Social History   Socioeconomic History  . Marital status: Married    Spouse name: Not on file  . Number of children: Not on file  . Years of education: Not on file  . Highest education level: Not on file  Occupational History  . Not on file  Social Needs  . Financial resource strain: Not on file  . Food insecurity:    Worry:  Not on file    Inability: Not on file  . Transportation needs:    Medical: Not on file    Non-medical: Not on file  Tobacco Use  . Smoking status: Former Smoker    Last attempt to quit: 12/20/1989    Years since quitting: 28.0  . Smokeless tobacco: Never Used  Substance and Sexual Activity  . Alcohol use: Yes    Comment: 4 times a year  . Drug use: No  . Sexual activity: Not on file  Lifestyle  . Physical activity:    Days per week: Not on file    Minutes per session: Not on file  . Stress: Not on file  Relationships  . Social connections:    Talks on phone: Not on file    Gets together: Not on file    Attends religious service: Not on file    Active member of club or organization: Not on file    Attends meetings of clubs or organizations: Not on file     Relationship status: Not on file  . Intimate partner violence:    Fear of current or ex partner: Not on file    Emotionally abused: Not on file    Physically abused: Not on file    Forced sexual activity: Not on file  Other Topics Concern  . Not on file  Social History Narrative  . Not on file    Family Status  Relation Name Status  . Mother  Deceased  . Father  Deceased  . Sister  Alive  . Brother 3 Alive  . Child 2 Alive  . Neg Hx  (Not Specified)    Review of Systems Review of Systems  Constitutional: Negative.   HENT: Negative.   Eyes: Negative.   Respiratory: Negative.   Cardiovascular: Negative.   Gastrointestinal: Negative.   Genitourinary: Negative.   Musculoskeletal: Negative.   Skin: Negative.   Neurological: Negative.   Endo/Heme/Allergies: Negative.      Objective:   VITALS:   Vitals:   12/20/17 1243  BP: 124/76  Pulse: 76  SpO2: 97%  Weight: 184 lb (83.5 kg)  Height: 5\' 2"  (1.575 m)   Gen:  Appears stated age and in NAD. HEENT:  Normocephalic, atraumatic. The mucous membranes are moist. The superficial temporal arteries are without ropiness or tenderness. Cardiovascular: Regular rate and rhythm. Lungs: Clear to auscultation bilaterally. Neck: There are no carotid bruits noted bilaterally.  NEUROLOGICAL:  Orientation:  The patient is alert and oriented x 3.  Recent and remote memory are intact.  Attention span and concentration are normal.  Able to name objects and repeat without trouble.  Fund of knowledge is appropriate Cranial nerves: There is good facial symmetry. The pupils are equal round and reactive to light bilaterally. Fundoscopic exam reveals clear disc margins bilaterally. Extraocular muscles are intact and visual fields are full to confrontational testing. Speech is fluent and clear. Soft palate rises symmetrically and there is no tongue deviation. Hearing is intact to conversational tone. Tone: Tone is good throughout. Sensation:  Sensation is intact to light touch and pinprick throughout (facial, trunk, extremities). Vibration is intact at the bilateral big toe but slightly decreased. There is no extinction with double simultaneous stimulation. There is no sensory dermatomal level identified. Coordination:  The patient has no dysdiadichokinesia or dysmetria. Motor: Strength is 5/5 in the bilateral upper and lower extremities.  Shoulder shrug is equal bilaterally.  There is no pronator drift.  There are no fasciculations  noted. DTR's: Deep tendon reflexes are 2/4 at the bilateral biceps, triceps, brachioradialis, 0-1 at the bilateral patella and achilles.  Plantar responses are downgoing bilaterally. Gait and Station: The patient is able to ambulate without difficulty. The patient is able to heel toe walk without any difficulty. The patient is able to ambulate in a tandem fashion. The patient is able to stand in the Romberg position.   MOVEMENT EXAM: Tremor:  There is no rest tremor.  There is no significant intention tremor.  There is mild postural tremor on the right.  This increases when given a weight.  She has no significant trouble with Archimedes spirals, but minor tremor is evident.  She is able to pour water from one glass to another without spilling it.  There is no asterixis.    Chemistry      Component Value Date/Time   NA 138 11/03/2017 1350   K 4.6 11/03/2017 1350   CL 99 11/03/2017 1350   CO2 32 11/03/2017 1350   BUN 21 11/03/2017 1350   CREATININE 0.81 11/03/2017 1350      Component Value Date/Time   CALCIUM 9.3 11/03/2017 1350   ALKPHOS 77 05/13/2017 0950   AST 21 05/13/2017 0950   ALT 24 05/13/2017 0950   BILITOT 0.4 05/13/2017 0950     Lab Results  Component Value Date   TSH 2.83 11/03/2017   No results found for: WBC, HGB, HCT, MCV, PLT      Assessment/Plan:   1.  Tremor  -This may be essential tremor, but still feel that Depakote is at least exacerbating tremor.  She was only off of  it for a week, which may not be enough time to say that Depakote is not at least part of the problem.  She and I discussed this today.  She and I discussed medications, but she really does not want more medication, nor would I really recommend any for her.  Her tremor is fairly mild.  In addition, often times medications do not help tremor when they are given to help tremor that is caused by a side effect of another medication.  -Patient was very worried today and asked about having some type of neuroimaging.  Her neuro exam is nonfocal and nonlateralizing.  I really did not think any neuro imaging was necessary, but we agreed on CT of the brain.  2.  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  She describes some of this.  The only medication on her list that can occasionally cause of myoclonus is Cymbalta.  Her renal function is normal.  If that gets worse, I am happy to see her back to evaluate it.  3.  F/u prn  CC:  Leone Haven, MD

## 2017-12-20 ENCOUNTER — Encounter: Payer: Self-pay | Admitting: Neurology

## 2017-12-20 ENCOUNTER — Ambulatory Visit (INDEPENDENT_AMBULATORY_CARE_PROVIDER_SITE_OTHER): Payer: Medicare Other | Admitting: Neurology

## 2017-12-20 VITALS — BP 124/76 | HR 76 | Ht 62.0 in | Wt 184.0 lb

## 2017-12-20 DIAGNOSIS — G253 Myoclonus: Secondary | ICD-10-CM | POA: Diagnosis not present

## 2017-12-20 DIAGNOSIS — R251 Tremor, unspecified: Secondary | ICD-10-CM | POA: Diagnosis not present

## 2017-12-20 NOTE — Patient Instructions (Signed)
1. We have scheduled you at [facility] for your CT head on 12/23/17 at 3:10 pm. Please arrive 20 minutes prior and go to [place]. If you need to change this appt please call [phone].

## 2017-12-23 ENCOUNTER — Ambulatory Visit
Admission: RE | Admit: 2017-12-23 | Discharge: 2017-12-23 | Disposition: A | Discharge status: Home / Self Care | Payer: Medicare Other | Source: Ambulatory Visit | Attending: Neurology | Admitting: Neurology

## 2017-12-23 DIAGNOSIS — G253 Myoclonus: Secondary | ICD-10-CM

## 2017-12-23 DIAGNOSIS — R251 Tremor, unspecified: Secondary | ICD-10-CM | POA: Diagnosis not present

## 2017-12-24 ENCOUNTER — Telehealth: Payer: Self-pay | Admitting: Neurology

## 2017-12-24 NOTE — Telephone Encounter (Signed)
Patient's husband made aware (per DPR) that CT okay.

## 2017-12-24 NOTE — Telephone Encounter (Signed)
-----   Message from [place], DO sent at 12/24/2017  7:59 AM EST ----- Let pt know that head CT is normal

## 2017-12-24 NOTE — Progress Notes (Deleted)
[facility] [address] [place], [place] [address] Phone: [phone] Subjective:    I'm seeing this patient by the request  of:    CC:   [id number]  Nicole Washington is a 67 y.o. female coming in with complaint of ***  Onset-  Location Duration-  Character- Aggravating factors- Reliving factors-  Therapies tried-  Severity-     Past Medical History:  Diagnosis Date  . Burning mouth syndrome   . CAD (coronary artery disease)   . Cancer (HCC)    basal and squamous cell  . Depression   . GERD (gastroesophageal reflux disease)   . Hyperlipidemia   . Hypertension   . Thyroid disease    Past Surgical History:  Procedure Laterality Date  . BREAST BIOPSY Right    Neg  . CESAREAN SECTION     Social History   Socioeconomic History  . Marital status: Married    Spouse name: Not on file  . Number of children: Not on file  . Years of education: Not on file  . Highest education level: Not on file  Occupational History  . Not on file  Social Needs  . Financial resource strain: Not on file  . Food insecurity:    Worry: Not on file    Inability: Not on file  . Transportation needs:    Medical: Not on file    Non-medical: Not on file  Tobacco Use  . Smoking status: Former Smoker    Last attempt to quit: 12/20/1989    Years since quitting: 28.0  . Smokeless tobacco: Never Used  Substance and Sexual Activity  . Alcohol use: Yes    Comment: 4 times a year  . Drug use: No  . Sexual activity: Not on file  Lifestyle  . Physical activity:    Days per week: Not on file    Minutes per session: Not on file  . Stress: Not on file  Relationships  . Social connections:    Talks on phone: Not on file    Gets together: Not on file    Attends religious service: Not on file    Active member of club or organization: Not on file    Attends meetings of clubs or organizations: Not on file    Relationship status: Not on file  Other Topics  Concern  . Not on file  Social History Narrative  . Not on file   Allergies  Allergen Reactions  . Codeine Itching  . Tylenol [Acetaminophen] Itching   Family History  Problem Relation Age of Onset  . Cerebral aneurysm Mother 88  . COPD Father   . Cancer Brother        ? colon  . Breast cancer Neg Hx      Current Outpatient Medications (Cardiovascular):  .  amLODipine (NORVASC) 5 MG tablet, Take 5 mg by mouth daily. Marland Kitchen  atorvastatin (LIPITOR) 40 MG tablet, Take 40 mg by mouth daily. .  carvedilol (COREG) 3.125 MG tablet, Take 3.125 mg by mouth 2 (two) times daily with a meal. .  losartan (COZAAR) 25 MG tablet, Take 25 mg by mouth daily.   Current Outpatient Medications (Analgesics):  .  aspirin EC 81 MG tablet, Take 81 mg by mouth daily.   Current Outpatient Medications (Other):  .  clonazePAM (KLONOPIN) 0.5 MG tablet, TAKE 1.5 TABLETS BY MOUTH DAILY AS NEEDED (Patient taking differently: Take 0.5 mg by mouth as needed. ) .  divalproex (  DEPAKOTE) 500 MG DR tablet, Take 1,000 mg by mouth at bedtime. .  DULoxetine (CYMBALTA) 60 MG capsule, Take 60 mg by mouth 2 (two) times daily. .  QUEtiapine (SEROQUEL) 25 MG tablet, Take 37.5 mg by mouth at bedtime. .  ranitidine (ZANTAC) 300 MG tablet, Take 300 mg by mouth at bedtime.    Past medical history, social, surgical and family history all reviewed in electronic medical record.  No pertanent information unless stated regarding to the chief complaint.   Review of Systems:  No headache, visual changes, nausea, vomiting, diarrhea, constipation, dizziness, abdominal pain, skin rash, fevers, chills, night sweats, weight loss, swollen lymph nodes, body aches, joint swelling, muscle aches, chest pain, shortness of breath, mood changes.   Objective  There were no vitals taken for this visit. Systems examined below as of    General: No apparent distress alert and oriented x3 mood and affect normal, dressed appropriately.  HEENT:  Pupils equal, extraocular movements intact  Respiratory: Patient's speak in full sentences and does not appear short of breath  Cardiovascular: No lower extremity edema, non tender, no erythema  Skin: Warm dry intact with no signs of infection or rash on extremities or on axial skeleton.  Abdomen: Soft nontender  Neuro: Cranial nerves II through XII are intact, neurovascularly intact in all extremities with 2+ DTRs and 2+ pulses.  Lymph: No lymphadenopathy of posterior or anterior cervical chain or axillae bilaterally.  Gait normal with good balance and coordination.  MSK:  Non tender with full range of motion and good stability and symmetric strength and tone of shoulders, elbows, wrist, hip, knee and ankles bilaterally.     Impression and Recommendations:     This case required medical decision making of moderate complexity. The above documentation has been reviewed and is accurate and complete Lyndal Pulley, DO       Note: This dictation was prepared with Dragon dictation along with smaller phrase technology. Any transcriptional errors that result from this process are unintentional.

## 2017-12-27 ENCOUNTER — Ambulatory Visit: Payer: Medicare Other | Admitting: Family Medicine

## 2018-01-19 ENCOUNTER — Encounter: Payer: Self-pay | Admitting: Family Medicine

## 2018-01-19 ENCOUNTER — Ambulatory Visit (INDEPENDENT_AMBULATORY_CARE_PROVIDER_SITE_OTHER): Payer: Medicare Other | Admitting: Family Medicine

## 2018-01-19 VITALS — BP 122/80 | HR 94 | Temp 98.1°F | Ht 62.0 in | Wt 183.2 lb

## 2018-01-19 DIAGNOSIS — L299 Pruritus, unspecified: Secondary | ICD-10-CM | POA: Diagnosis not present

## 2018-01-19 LAB — COMPREHENSIVE METABOLIC PANEL
ALBUMIN: 4.3 g/dL (ref 3.5–5.2)
ALK PHOS: 102 U/L (ref 39–117)
ALT: 46 U/L — ABNORMAL HIGH (ref 0–35)
AST: 27 U/L (ref 0–37)
BILIRUBIN TOTAL: 0.5 mg/dL (ref 0.2–1.2)
BUN: 15 mg/dL (ref 6–23)
CALCIUM: 9.3 mg/dL (ref 8.4–10.5)
CHLORIDE: 102 meq/L (ref 96–112)
CO2: 28 mEq/L (ref 19–32)
Creatinine, Ser: 0.8 mg/dL (ref 0.40–1.20)
GFR: 75.72 mL/min (ref >60.00)
Glucose, Bld: 99 mg/dL (ref 70–99)
Potassium: 3.9 mEq/L (ref 3.5–5.1)
Sodium: 138 mEq/L (ref 135–145)
TOTAL PROTEIN: 7.5 g/dL (ref 6.0–8.3)

## 2018-01-19 LAB — CBC
HCT: 39.5 % (ref 36.0–46.0)
Hemoglobin: 13.1 g/dL (ref 12.0–15.0)
MCHC: 33.1 g/dL (ref 30.0–36.0)
MCV: 86.3 fl (ref 78.0–100.0)
PLATELETS: 336 10*3/uL (ref 150.0–400.0)
RBC: 4.58 Mil/uL (ref 3.87–5.11)
RDW: 14.9 % (ref 11.5–15.5)
WBC: 7.2 10*3/uL (ref 4.0–10.5)

## 2018-01-19 LAB — TSH: TSH: 1.86 u[IU]/mL (ref 0.35–4.50)

## 2018-01-19 NOTE — Progress Notes (Signed)
  Tommi Rumps, MD Phone: [phone]  Nicole Washington is a 67 y.o. female who presents today for f/u.  CC: itching  Patient notes onset of symptoms 2 weeks ago.  She notes she itches all over.  She has had no rash.  She has not changed lotions, soaps, or detergents.  She uses non-scented lotion.  She notes the itching is worse after getting out of the shower.  She notes it did start since she stopped her Depakote greater than 3 weeks ago.  She has not been on any new medications.  She has had no weight loss.  She does have chronic hot flashes since she went through menopause where she gets hot at night and does have some sweats though those have been unchanged since going through menopause.  She does note she took a clonazepam and that helped with her symptoms.  Social History   Tobacco Use  Smoking Status Former Smoker  . Last attempt to quit: 12/20/1989  . Years since quitting: 28.1  Smokeless Tobacco Never Used     ROS see history of present illness  Objective  Physical Exam Vitals:   01/19/18 1349  BP: 122/80  Pulse: 94  Temp: 98.1 F (36.7 C)  SpO2: 94%    BP Readings from Last 3 Encounters:  01/19/18 122/80  12/20/17 124/76  11/15/17 102/62   Wt Readings from Last 3 Encounters:  01/19/18 183 lb 3.2 oz (83.1 kg)  12/20/17 184 lb (83.5 kg)  11/03/17 183 lb (83 kg)    Physical Exam  Constitutional: No distress.  Cardiovascular: Normal rate, regular rhythm and normal heart sounds.  Pulmonary/Chest: Effort normal and breath sounds normal.  Musculoskeletal: She exhibits no edema.  Lymphadenopathy:    She has no cervical adenopathy.    She has no axillary adenopathy.       Right: No inguinal adenopathy present.       Left: No inguinal adenopathy present.  Neurological: She is alert.  Skin: Skin is warm and dry. She is not diaphoretic.     Assessment/Plan: Please see individual problem list.  Itching Undetermined cause of itching.  Discussed lab evaluation.   Discussed completing a chest x-ray to evaluate for adenopathy though the patient and I opted to defer this pending lab work.  If lab work is unremarkable would consider obtaining a chest x-ray.   Orders Placed This Encounter  Procedures  . CBC  . Comp Met (CMET)  . TSH    No orders of the defined types were placed in this encounter.    Tommi Rumps, MD [place]

## 2018-01-19 NOTE — Assessment & Plan Note (Signed)
Undetermined cause of itching.  Discussed lab evaluation.  Discussed completing a chest x-ray to evaluate for adenopathy though the patient and I opted to defer this pending lab work.  If lab work is unremarkable would consider obtaining a chest x-ray.

## 2018-01-19 NOTE — Patient Instructions (Signed)
Nice to see you. We will get lab work today and then determine the next step in evaluation of your itching.

## 2018-01-20 ENCOUNTER — Other Ambulatory Visit: Payer: Self-pay | Admitting: Family Medicine

## 2018-01-20 DIAGNOSIS — L299 Pruritus, unspecified: Secondary | ICD-10-CM

## 2018-01-20 DIAGNOSIS — R7989 Other specified abnormal findings of blood chemistry: Secondary | ICD-10-CM

## 2018-01-20 DIAGNOSIS — R945 Abnormal results of liver function studies: Principal | ICD-10-CM

## 2018-01-25 DIAGNOSIS — L299 Pruritus, unspecified: Secondary | ICD-10-CM | POA: Diagnosis not present

## 2018-02-21 ENCOUNTER — Ambulatory Visit (INDEPENDENT_AMBULATORY_CARE_PROVIDER_SITE_OTHER): Payer: Medicare Other

## 2018-02-21 ENCOUNTER — Other Ambulatory Visit (INDEPENDENT_AMBULATORY_CARE_PROVIDER_SITE_OTHER): Payer: Medicare Other

## 2018-02-21 DIAGNOSIS — L299 Pruritus, unspecified: Secondary | ICD-10-CM

## 2018-02-21 DIAGNOSIS — R945 Abnormal results of liver function studies: Secondary | ICD-10-CM

## 2018-02-21 DIAGNOSIS — R7989 Other specified abnormal findings of blood chemistry: Secondary | ICD-10-CM

## 2018-02-21 LAB — HEPATIC FUNCTION PANEL
ALBUMIN: 4.3 g/dL (ref 3.5–5.2)
ALK PHOS: 116 U/L (ref 39–117)
ALT: 35 U/L (ref 0–35)
AST: 22 U/L (ref 0–37)
BILIRUBIN TOTAL: 0.6 mg/dL (ref 0.2–1.2)
Bilirubin, Direct: 0.1 mg/dL (ref 0.0–0.3)
TOTAL PROTEIN: 7.3 g/dL (ref 6.0–8.3)

## 2018-02-22 ENCOUNTER — Telehealth: Payer: Self-pay | Admitting: Family Medicine

## 2018-02-22 DIAGNOSIS — F4312 Post-traumatic stress disorder, chronic: Secondary | ICD-10-CM | POA: Diagnosis not present

## 2018-02-22 DIAGNOSIS — F5105 Insomnia due to other mental disorder: Secondary | ICD-10-CM | POA: Diagnosis not present

## 2018-02-22 DIAGNOSIS — F39 Unspecified mood [affective] disorder: Secondary | ICD-10-CM | POA: Diagnosis not present

## 2018-02-22 DIAGNOSIS — F411 Generalized anxiety disorder: Secondary | ICD-10-CM | POA: Diagnosis not present

## 2018-02-22 NOTE — Telephone Encounter (Signed)
Lab results reviewed in result notes.

## 2018-02-22 NOTE — Telephone Encounter (Signed)
Copied from [place] [id number]. Topic: Quick Communication - Lab Results (Clinic Use ONLY) >> Feb 22, 2018 10:53 AM Myriam Forehand, [place] wrote: Called patient to inform them of  lab results. When patient returns call, triage nurse may disclose results. >> Feb 22, 2018 11:05 AM Bea Graff, NT wrote: Pt returning call to receive lab and x-ray results.

## 2018-02-24 DIAGNOSIS — Z79899 Other long term (current) drug therapy: Secondary | ICD-10-CM | POA: Diagnosis not present

## 2018-02-24 DIAGNOSIS — F39 Unspecified mood [affective] disorder: Secondary | ICD-10-CM | POA: Diagnosis not present

## 2018-03-08 DIAGNOSIS — L298 Other pruritus: Secondary | ICD-10-CM | POA: Diagnosis not present

## 2018-03-08 DIAGNOSIS — L820 Inflamed seborrheic keratosis: Secondary | ICD-10-CM | POA: Diagnosis not present

## 2018-03-08 DIAGNOSIS — Z86006 Personal history of melanoma in-situ: Secondary | ICD-10-CM | POA: Diagnosis not present

## 2018-03-08 DIAGNOSIS — Z85828 Personal history of other malignant neoplasm of skin: Secondary | ICD-10-CM | POA: Diagnosis not present

## 2018-03-11 ENCOUNTER — Encounter: Payer: Self-pay | Admitting: Family Medicine

## 2018-03-11 ENCOUNTER — Ambulatory Visit (INDEPENDENT_AMBULATORY_CARE_PROVIDER_SITE_OTHER): Payer: Medicare Other | Admitting: Family Medicine

## 2018-03-11 VITALS — BP 110/80 | HR 89 | Temp 98.3°F | Ht 62.0 in | Wt 181.8 lb

## 2018-03-11 DIAGNOSIS — M25569 Pain in unspecified knee: Secondary | ICD-10-CM | POA: Insufficient documentation

## 2018-03-11 DIAGNOSIS — M7989 Other specified soft tissue disorders: Secondary | ICD-10-CM | POA: Diagnosis not present

## 2018-03-11 DIAGNOSIS — L299 Pruritus, unspecified: Secondary | ICD-10-CM

## 2018-03-11 DIAGNOSIS — M79661 Pain in right lower leg: Secondary | ICD-10-CM | POA: Diagnosis not present

## 2018-03-11 DIAGNOSIS — R0789 Other chest pain: Secondary | ICD-10-CM | POA: Insufficient documentation

## 2018-03-11 LAB — D-DIMER, QUANTITATIVE (NOT AT ARMC): D DIMER QUANT: 0.37 ug{FEU}/mL (ref <0.50)

## 2018-03-11 NOTE — Progress Notes (Signed)
Tommi Rumps, MD Phone: [phone]  Nicole Washington is a 68 y.o. female who presents today for f/u.  CC: Chest pain, posterior right knee pain, itching  Chest pain: Patient notes on several occasions in December she felt a sharp pain around her heart that lasted 10 to 20 seconds and occurred while she was sitting there.  There is no exertional component.  No radiation.  No diaphoresis.  No shortness of breath.  No cough.  No wheezing.  No history of clot.  No history of MI.  No prolonged plane rides.  She notes she had not taken her aspirin for some time though she restarted this and has not had any issues since December.  She does note bilateral foot and ankle edema on 2 occasions that occurred around Christmas.  She was sitting for a long period of time eating and noted when she got up her feet and ankles were swollen.  This resolved overnight and has not recurred.  Right posterior knee pain: Patient notes starting about a month ago she developed some discomfort posterior to her right knee.  Notes it feels like a soreness particularly when she walks or bends the leg.  No pain in her calves.  No pain in her anterior knee.  It hurts every day.  No stiffness.  No injury.  Itching: She saw dermatology.  She notes her itching is improved.  They placed her on gabapentin.  This has been beneficial.  Social History   Tobacco Use  Smoking Status Former Smoker  . Last attempt to quit: 12/20/1989  . Years since quitting: 28.2  Smokeless Tobacco Never Used     ROS see history of present illness  Objective  Physical Exam Vitals:   03/11/18 1130  BP: 110/80  Pulse: 89  Temp: 98.3 F (36.8 C)  SpO2: 93%    BP Readings from Last 3 Encounters:  03/11/18 110/80  01/19/18 122/80  12/20/17 124/76   Wt Readings from Last 3 Encounters:  03/11/18 181 lb 12.8 oz (82.5 kg)  01/19/18 183 lb 3.2 oz (83.1 kg)  12/20/17 184 lb (83.5 kg)    Physical Exam Constitutional:      General: She is  not in acute distress.    Appearance: She is not diaphoretic.  Cardiovascular:     Rate and Rhythm: Normal rate and regular rhythm.     Heart sounds: Normal heart sounds.  Pulmonary:     Effort: Pulmonary effort is normal.     Breath sounds: Normal breath sounds.  Musculoskeletal:     Comments: Bilateral legs with no swelling, right knee with no anterior or posterior tenderness, no palpable abnormalities right posterior knee, left knee with no tenderness   Skin:    General: Skin is warm and dry.  Neurological:     Mental Status: She is alert.    EKG: Normal sinus rhythm, low voltage precordial leads, no ST or T wave changes  Assessment/Plan: Please see individual problem list.  Pain and swelling of lower leg, right Posterior right knee discomfort.  I suspect this likely represents a joint issue or muscular strain though given the constellation of her symptoms we will obtain a d-dimer.  I discussed if this is positive the need for an ultrasound.  Given return precautions.  Atypical chest pain This potentially could be muscular.  I discussed that it did not seem to be cardiac.  It is reassuring this is not recurred in the last several weeks.  EKG is reassuring.  Unlikely to be VTE though given constellation of symptoms we will obtain a d-dimer.  I have a low suspicion for VTE.  Will refer to cardiology.  Given return precautions.  Itching Resolved with gabapentin.  She will continue this and follow with dermatology as planned.   Orders Placed This Encounter  Procedures  . D-Dimer, Quantitative  . Ambulatory referral to Cardiology    Referral Priority:   Routine    Referral Type:   Consultation    Referral Reason:   Specialty Services Required    Requested Specialty:   Cardiology    Number of Visits Requested:   1  . EKG 12-Lead    No orders of the defined types were placed in this encounter.    Tommi Rumps, MD [place]

## 2018-03-11 NOTE — Patient Instructions (Signed)
Nice to see you. We will obtain lab work today and contact you with results. If you have recurrent chest pain please go to the emergency room. We will get you to see cardiology.

## 2018-03-11 NOTE — Assessment & Plan Note (Signed)
Posterior right knee discomfort.  I suspect this likely represents a joint issue or muscular strain though given the constellation of her symptoms we will obtain a d-dimer.  I discussed if this is positive the need for an ultrasound.  Given return precautions.

## 2018-03-11 NOTE — Assessment & Plan Note (Signed)
This potentially could be muscular.  I discussed that it did not seem to be cardiac.  It is reassuring this is not recurred in the last several weeks.  EKG is reassuring.  Unlikely to be VTE though given constellation of symptoms we will obtain a d-dimer.  I have a low suspicion for VTE.  Will refer to cardiology.  Given return precautions.

## 2018-03-11 NOTE — Assessment & Plan Note (Signed)
Resolved with gabapentin.  She will continue this and follow with dermatology as planned.

## 2018-03-16 ENCOUNTER — Telehealth: Payer: Self-pay | Admitting: Family Medicine

## 2018-03-16 NOTE — Telephone Encounter (Signed)
Called and spoke with patient. Pt advised and voiced understanding.  

## 2018-03-16 NOTE — Telephone Encounter (Signed)
Copied from [place]. Topic: Quick Communication - See Telephone Encounter >> Mar 16, 2018 11:20 AM Bea Graff, NT wrote: CRM for notification. See Telephone encounter for: 03/16/18. Pt would like a call with her lab results from 03/11/2018.

## 2018-03-23 ENCOUNTER — Telehealth: Payer: Self-pay | Admitting: Family Medicine

## 2018-03-23 MED ORDER — LOSARTAN POTASSIUM 25 MG PO TABS: 25.0000 mg | ORAL_TABLET | Freq: Every day | ORAL | 1 refills | Status: DC

## 2018-03-23 MED ORDER — CARVEDILOL 3.125 MG PO TABS: 3.1250 mg | ORAL_TABLET | Freq: Two times a day (BID) | ORAL | 1 refills | Status: DC

## 2018-03-23 MED ORDER — AMLODIPINE BESYLATE 5 MG PO TABS: 5.0000 mg | ORAL_TABLET | Freq: Every day | ORAL | 1 refills | Status: DC

## 2018-03-23 NOTE — Telephone Encounter (Signed)
Sent to pharmacy 

## 2018-03-23 NOTE — Addendum Note (Signed)
Addended by: Leone Haven on: 03/23/2018 05:57 PM   Modules accepted: Orders

## 2018-03-23 NOTE — Telephone Encounter (Signed)
Will route to office for final disposition; pt last seen by Dr Caryl Bis 03/11/2018; also see CRM # [phone].

## 2018-03-23 NOTE — Telephone Encounter (Signed)
Sent to PCP for approval to refill new Rx's used to be prescribed by Cardiologist

## 2018-03-23 NOTE — Telephone Encounter (Signed)
Copied from [place] [id number]. Topic: Quick Communication - Rx Refill/Question >> Mar 23, 2018 11:40 AM Alanda Slim E wrote: Medication: amLODipine (NORVASC) 5 MG tablet  carvedilol (COREG) 3.125 MG tablet   losartan (COZAAR) 25 MG tablet  These medications have been being prescribed by the Pt's old cardiologist. The Pt is requesting that they be prescribed by Dr. Caryl Bis from now on/ please advise if need  Has the patient contacted their pharmacy? No (cardiologist called to advised Pt that they prescribed for the above meds)    Preferred Pharmacy (with phone number or street name): [id number] [address], [place] [phone] (Phone) [phone] (Fax)    Agent: Please be advised that RX refills may take up to 3 business days. We ask that you follow-up with your pharmacy.

## 2018-03-24 ENCOUNTER — Other Ambulatory Visit: Payer: Self-pay | Admitting: Family Medicine

## 2018-03-25 DIAGNOSIS — H6122 Impacted cerumen, left ear: Secondary | ICD-10-CM | POA: Diagnosis not present

## 2018-03-25 DIAGNOSIS — H6980 Other specified disorders of Eustachian tube, unspecified ear: Secondary | ICD-10-CM | POA: Diagnosis not present

## 2018-03-28 NOTE — Telephone Encounter (Signed)
Controlled substance database reviewed. Sent to pharmacy.   

## 2018-04-19 DIAGNOSIS — F411 Generalized anxiety disorder: Secondary | ICD-10-CM | POA: Diagnosis not present

## 2018-04-19 DIAGNOSIS — F5105 Insomnia due to other mental disorder: Secondary | ICD-10-CM | POA: Diagnosis not present

## 2018-04-19 DIAGNOSIS — F39 Unspecified mood [affective] disorder: Secondary | ICD-10-CM | POA: Diagnosis not present

## 2018-04-19 DIAGNOSIS — F4312 Post-traumatic stress disorder, chronic: Secondary | ICD-10-CM | POA: Diagnosis not present

## 2018-04-25 ENCOUNTER — Telehealth: Payer: Self-pay | Admitting: Family Medicine

## 2018-04-25 NOTE — Telephone Encounter (Signed)
Please find out if the patient needs an actual referral given that she has seen Dr. Carles Collet previously.  What is described in the message may actually best be seen in our office first to determine if neurology is the right specialist for her to see.  It also appears that she has an appointment with Dr. Carles Collet on 04/26/2018.

## 2018-04-25 NOTE — Telephone Encounter (Signed)
Sent to PCP   Referral was placed back in October 2019 but unsure if it was sent to Dr. Carles Collet

## 2018-04-25 NOTE — Telephone Encounter (Signed)
Copied from [place] [id number]. Topic: Referral - Request for Referral >> Apr 25, 2018 11:54 AM Blase Mess A wrote: Has patient seen PCP for this complaint? Yes *If NO, is insurance requiring patient see PCP for this issue before PCP can refer them? No Referral for which specialty: Neurology Preferred provider/office: Unknown Have previously seen Dr. Carles Collet offered appt 7/20 with out referral Reason for referral: Neurology  goose bumps and chills but not cold

## 2018-04-26 ENCOUNTER — Encounter: Payer: Self-pay | Admitting: Neurology

## 2018-04-26 ENCOUNTER — Ambulatory Visit (INDEPENDENT_AMBULATORY_CARE_PROVIDER_SITE_OTHER): Payer: Medicare Other | Admitting: Neurology

## 2018-04-26 VITALS — BP 104/62 | HR 96 | Ht 62.0 in | Wt 185.0 lb

## 2018-04-26 DIAGNOSIS — R209 Unspecified disturbances of skin sensation: Secondary | ICD-10-CM

## 2018-04-26 NOTE — Progress Notes (Signed)
Subjective:   Nicole Washington was seen in consultation in the movement disorder clinic at the request of Leone Haven, MD.  The evaluation is for tremor.  Tremor started approximately 6 months ago and involves "everything."  She describes some tremor in the hands but then also describes a jerking/twitching that is separate.  Twitches can occur in arms/legs/feet.    First mention twitching in her March, 2019 visit with her primary care physician   Tremor is most noticeable when using the hands.   There is a family hx of tremor in a maternal uncle, with PD.    Affected by caffeine:  No. (1-2 cups coffee/day) Affected by alcohol:   (drinks 1 glass wine q 6 months so unknown) Affected by stress:  Yes.   Affected by fatigue:  No. Spills soup if on spoon:  Yes.   Spills glass of liquid if full:  No. Affects ADL's (tying shoes, brushing teeth, etc):  No.  Current medications that may exacerbate tremor:  Depakote. Pt has been on VPA for 1 year Patient has tried to go off of Depakote for a week and did not seem to change tremor.  She then tried to go off of Seroquel for a week and that did not seem to change tremor.  Outside reports reviewed: lab reports, office notes, radiology reports and referral letter/letters.  04/26/18 update: Patient seen today for follow-up but for a new problem described as "goose bumps."  She reports that she has a strange tingling in her head like when she first gets the goose bumps.  "I can't scratch it because it gets worse."  She went to the dermatologist and she got cream.  I have reviewed those records.  She was treated for itching.  "I dont think that this is an external thing."  "its like I get chills but I'm not cold."  It can go anywhere but I'm not cold."  tried gabapentin without relief.  A soft touch to the skin will make it start.  It can last all night long.  No new meds since this all started.  States that she has had this for months.  Just a few strands of  hair touching her neck can initiate the process.  Takes klonopin for burning mouth syndrome and states that when she takes that it will help this sensation.  Just started Lamictal and zoloft this week for mood stabalization.     Allergies  Allergen Reactions  . Codeine Itching  . Tylenol [Acetaminophen] Itching    Outpatient Encounter Medications as of 04/26/2018  Medication Sig  . amLODipine (NORVASC) 5 MG tablet Take 1 tablet (5 mg total) by mouth daily.  Marland Kitchen aspirin EC 81 MG tablet Take 81 mg by mouth daily.  Marland Kitchen atorvastatin (LIPITOR) 40 MG tablet Take 40 mg by mouth daily.  . carvedilol (COREG) 3.125 MG tablet Take 1 tablet (3.125 mg total) by mouth 2 (two) times daily with a meal.  . clonazePAM (KLONOPIN) 0.5 MG tablet Take 1 tablet (0.5 mg total) by mouth as needed.  . DULoxetine (CYMBALTA) 60 MG capsule Take 60 mg by mouth 2 (two) times daily.  Marland Kitchen gabapentin (NEURONTIN) 300 MG capsule Take by mouth.  . lamoTRIgine (LAMICTAL) 25 MG tablet Take 25 mg by mouth daily.  Marland Kitchen losartan (COZAAR) 25 MG tablet Take 1 tablet (25 mg total) by mouth daily.  . QUEtiapine (SEROQUEL) 25 MG tablet Take 75-100 mg by mouth at bedtime.   . sertraline (ZOLOFT)  50 MG tablet Take 50 mg by mouth daily.  . [DISCONTINUED] diphenhydrAMINE (BENADRYL) 50 MG tablet Take 50 mg by mouth at bedtime as needed for itching.  . [DISCONTINUED] ranitidine (ZANTAC) 300 MG tablet Take 300 mg by mouth at bedtime.   No facility-administered encounter medications on file as of 04/26/2018.     Past Medical History:  Diagnosis Date  . Burning mouth syndrome   . CAD (coronary artery disease)   . Cancer (HCC)    basal and squamous cell  . Depression   . GERD (gastroesophageal reflux disease)   . Hyperlipidemia   . Hypertension   . Thyroid disease     Past Surgical History:  Procedure Laterality Date  . BREAST BIOPSY Right    Neg  . CESAREAN SECTION      Social History   Socioeconomic History  . Marital status:  Married    Spouse name: Not on file  . Number of children: Not on file  . Years of education: Not on file  . Highest education level: Not on file  Occupational History  . Not on file  Social Needs  . Financial resource strain: Not on file  . Food insecurity:    Worry: Not on file    Inability: Not on file  . Transportation needs:    Medical: Not on file    Non-medical: Not on file  Tobacco Use  . Smoking status: Former Smoker    Last attempt to quit: 12/20/1989    Years since quitting: 28.3  . Smokeless tobacco: Never Used  Substance and Sexual Activity  . Alcohol use: Yes    Comment: 4 times a year  . Drug use: No  . Sexual activity: Not on file  Lifestyle  . Physical activity:    Days per week: Not on file    Minutes per session: Not on file  . Stress: Not on file  Relationships  . Social connections:    Talks on phone: Not on file    Gets together: Not on file    Attends religious service: Not on file    Active member of club or organization: Not on file    Attends meetings of clubs or organizations: Not on file    Relationship status: Not on file  . Intimate partner violence:    Fear of current or ex partner: Not on file    Emotionally abused: Not on file    Physically abused: Not on file    Forced sexual activity: Not on file  Other Topics Concern  . Not on file  Social History Narrative  . Not on file    Family Status  Relation Name Status  . Mother  Deceased  . Father  Deceased  . Sister  Alive  . Brother 3 Alive  . Child 2 Alive  . Neg Hx  (Not Specified)    Review of Systems Review of Systems  Constitutional: Negative.   HENT: Negative.   Eyes: Negative.   Respiratory: Negative.   Cardiovascular: Negative.   Skin: Negative.   Psychiatric/Behavioral: The patient is nervous/anxious.      Objective:   VITALS:   Vitals:   04/26/18 1043  BP: 104/62  Pulse: 96  SpO2: 94%  Weight: 185 lb (83.9 kg)  Height: 5\' 2"  (1.575 m)   GEN:  The  patient appears stated age and is in NAD. HEENT:  Normocephalic, atraumatic.  The mucous membranes are moist. The superficial temporal arteries are without  ropiness or tenderness. CV:  RRR Lungs:  CTAB Neck/HEME:  There are no carotid bruits bilaterally.  Neurological examination:  Orientation: The patient is alert and oriented x3. Cranial nerves: There is good facial symmetry. The speech is fluent and clear. Soft palate rises symmetrically and there is no tongue deviation. Hearing is intact to conversational tone. Sensation: Sensation is intact to light touch throughout Motor: Strength is 5/5 in the bilateral upper and lower extremities.   Shoulder shrug is equal and symmetric.  There is no pronator drift.  Movement examination: Tone: There is normal tone in the upper and lower extremities. Abnormal movements: There is no rest tremor.  There is no postural tremor.  There is no intention tremor. Coordination:  There is no decremation with RAM's, with any form of RAMS, including alternating supination and pronation of the forearm, hand opening and closing, finger taps, heel taps and toe taps. Gait and Station: The patient has no difficulty arising out of a deep-seated chair without the use of the hands. The patient's stride length is normal.       Chemistry      Component Value Date/Time   NA 138 01/19/2018 1417   K 3.9 01/19/2018 1417   CL 102 01/19/2018 1417   CO2 28 01/19/2018 1417   BUN 15 01/19/2018 1417   CREATININE 0.80 01/19/2018 1417      Component Value Date/Time   CALCIUM 9.3 01/19/2018 1417   ALKPHOS 116 02/21/2018 1352   AST 22 02/21/2018 1352   ALT 35 02/21/2018 1352   BILITOT 0.6 02/21/2018 1352     Lab Results  Component Value Date   TSH 1.86 01/19/2018   Lab Results  Component Value Date   WBC 7.2 01/19/2018   HGB 13.1 01/19/2018   HCT 39.5 01/19/2018   MCV 86.3 01/19/2018   PLT 336.0 01/19/2018        Assessment/Plan:   1.  Sensory  disturbance  -I strongly suspect that this is not of neurologic etiology.  We will do an EEG just to make sure everything there looks okay.  She has previously had a CT brain that was unremarkable.  If her EEG is nonfocal and nonlateralizing, I really have nothing further to add to her differential diagnoses.  CC:  Leone Haven, MD

## 2018-04-26 NOTE — Telephone Encounter (Signed)
Called and spoke with patient pt stated that at her last Ov Dr. Caryl Bis wanted her to follow up with Dermatology which she has done twice for her issue. Pt stated that she is starting to feel that her issue is more internal than external. Pt actually has an appt today to see Dr. Carles Collet @ 10:45 AM. Pt stated that at the moment she is OK pt c/o tingleing under skin feels like cold chills but she is NOT cold not cold.   Sent to PCP as an FYI no referral should be needed.

## 2018-04-26 NOTE — Patient Instructions (Signed)
Electroencephalogram (EEG) Instructions  To prepare for your EEG: Wash your hair the night before or the day of the test, but don't use any conditioners, hair creams, sprays or styling gels.  Avoid anything with caffeine 6 hours before the test. Take your usual medications unless instructed otherwise. If you're supposed to sleep during your EEG test, your doctor may ask you to sleep less or even avoid sleep entirely the night before your EEG. If you have trouble falling asleep for the test, you might be given a sedative to help you relax.   What to expect You'll feel little or no discomfort during an EEG. The electrodes don't transmit any sensations. They just record your brain waves. If you need to sleep during the EEG, you might be given a sedative beforehand to help you relax. During the test: A technician measures your head and marks your scalp with a type of pencil, to indicate where to attach the electrodes. Those spots on your scalp may be scrubbed with a gritty cream to improve the quality of the recording.  A technician attaches flat metal discs (electrodes) to your scalp using a special adhesive. The electrodes are connected with wires to an instrument that amplifies - makes bigger - the brain waves and records them on computer equipment. Some people wear and elastic cap fitted with electrodes, instead of having the adhesive applied to their scalps. Once the electrodes are in place, an EEG typically takes 30-60 minutes.  You relax in a comfortable position with your eyes closed during the test. At various times, the technician may ask you to open and close your eyes, perform a few simple calculations, read a paragraph, look at a picture, breathe deeply (hyperventilate) for a few minutes, or look at a flashing light.  After the test After the test, the technician removes the electrodes or cap. If no sedative was given, you should feel no side effects after the procedure, and you can return to  your normal routine.  If you used a sedative, it may take about an hour to partially recover from the medication. You'll need someone to take you home because it can take up to a day for the full effects of the sedative to wear off. Rest and don't drive for the remainder of the day.   

## 2018-05-04 ENCOUNTER — Other Ambulatory Visit: Payer: Self-pay

## 2018-05-04 ENCOUNTER — Ambulatory Visit: Payer: Medicare Other

## 2018-05-04 ENCOUNTER — Ambulatory Visit: Payer: Medicare Other | Admitting: Family Medicine

## 2018-05-05 ENCOUNTER — Telehealth: Payer: Self-pay

## 2018-05-05 ENCOUNTER — Telehealth: Payer: Self-pay | Admitting: Neurology

## 2018-05-05 ENCOUNTER — Ambulatory Visit (INDEPENDENT_AMBULATORY_CARE_PROVIDER_SITE_OTHER): Payer: Medicare Other | Admitting: Neurology

## 2018-05-05 DIAGNOSIS — R209 Unspecified disturbances of skin sensation: Secondary | ICD-10-CM

## 2018-05-05 DIAGNOSIS — L299 Pruritus, unspecified: Secondary | ICD-10-CM

## 2018-05-05 NOTE — Telephone Encounter (Signed)
Sent to PCP to place referral  

## 2018-05-05 NOTE — Telephone Encounter (Signed)
Patient made aware.

## 2018-05-05 NOTE — Telephone Encounter (Signed)
-----   Message from [place], DO sent at 05/05/2018  3:15 PM EDT ----- Let pt know that EEG is normal.  I do not think that the phenomenon she describes is a primary neuro complaint

## 2018-05-05 NOTE — Procedures (Signed)
TECHNICAL SUMMARY:  A multichannel referential and bipolar montage EEG using the standard international 10-20 system was performed on the patient described as awake and drowsy.  The dominant background activity consists of 10-11 hertz activity seen most prominantly over the posterior head region.  The backgound activity is reactive to eye opening and closing procedures.  Low voltage fast (beta) activity is distributed symmetrically and maximally over the anterior head regions.  ACTIVATION:  Stepwise photic stimulation at 4-20 flashes per second was performed and did not elicit any abnormal waveforms.  Hyperventilation was not performed.  EPILEPTIFORM ACTIVITY:  There were no spikes, sharp waves or paroxysmal activity.  SLEEP: Physiologic drowsiness is noted, but no stage II sleep.  CARDIAC:  The EKG lead revealed a regular rhythm.    IMPRESSION:  This is a normal EEG for the patients stated age.  There were no focal, hemispheric or lateralizing features.  No epileptiform activity was recorded.

## 2018-05-05 NOTE — Telephone Encounter (Signed)
Copied from [place] [id number]. Topic: Referral - Request for Referral >> May 05, 2018  3:39 PM Wynetta Emery, [place] C wrote: Pt is requesting a referral to Rheumatology, pt would like to see Dr. Hoyle Sauer. Pt says that she has seen PCP for concern.    CB: [phone]

## 2018-05-06 NOTE — Telephone Encounter (Signed)
Called and spoke with pt. Pt advised and voiced understanding.  

## 2018-05-06 NOTE — Telephone Encounter (Signed)
I am happy to place this referral though I need to know the specific indication she would like the referral for. Thanks.

## 2018-05-06 NOTE — Addendum Note (Signed)
Addended by: Leone Haven on: 05/06/2018 10:17 AM   Modules accepted: Orders

## 2018-05-06 NOTE — Telephone Encounter (Signed)
Noted  Referral placed.

## 2018-05-06 NOTE — Telephone Encounter (Signed)
Patient said in January she had an appointment with Dr Caryl Bis and she was experiencing some chills and itchiness that started in her head and it would go throughout her whole body. He suggested she see a dermatologist which she has done twice. She was given creams and gabapentin. She did not think it was external but really thinks that is internal. It did get better for a couple of weeks then it started up again. She went to see a neurologist and had a EEG done yesterday and they called her back with the results and the test was negative. She was advised to see if she could see a rheumatologist. She would like to see Dr Hoyle Sauer @ [facility] in [place]

## 2018-05-06 NOTE — Telephone Encounter (Signed)
Sent to PCP to place referral  

## 2018-05-06 NOTE — Telephone Encounter (Signed)
calle dpt and left a VM to call back. CRM created and sent to [facility] pool.

## 2018-05-10 DIAGNOSIS — F5105 Insomnia due to other mental disorder: Secondary | ICD-10-CM | POA: Diagnosis not present

## 2018-05-10 DIAGNOSIS — F411 Generalized anxiety disorder: Secondary | ICD-10-CM | POA: Diagnosis not present

## 2018-05-10 DIAGNOSIS — F4312 Post-traumatic stress disorder, chronic: Secondary | ICD-10-CM | POA: Diagnosis not present

## 2018-05-10 DIAGNOSIS — F39 Unspecified mood [affective] disorder: Secondary | ICD-10-CM | POA: Diagnosis not present

## 2018-05-11 DIAGNOSIS — R202 Paresthesia of skin: Secondary | ICD-10-CM | POA: Diagnosis not present

## 2018-05-11 DIAGNOSIS — M159 Polyosteoarthritis, unspecified: Secondary | ICD-10-CM | POA: Insufficient documentation

## 2018-05-11 DIAGNOSIS — I998 Other disorder of circulatory system: Secondary | ICD-10-CM | POA: Diagnosis not present

## 2018-05-25 ENCOUNTER — Other Ambulatory Visit: Payer: Self-pay | Admitting: Family Medicine

## 2018-05-26 DIAGNOSIS — M1711 Unilateral primary osteoarthritis, right knee: Secondary | ICD-10-CM | POA: Diagnosis not present

## 2018-05-26 NOTE — Telephone Encounter (Signed)
Last OV 03/11/2018  Last refilled 03/28/2018 disp 30 with 1 refill   Next OV 09/12/2018  Sent to PCP for approval

## 2018-05-31 ENCOUNTER — Ambulatory Visit (INDEPENDENT_AMBULATORY_CARE_PROVIDER_SITE_OTHER): Payer: Medicare Other | Admitting: Internal Medicine

## 2018-05-31 ENCOUNTER — Encounter: Payer: Self-pay | Admitting: Internal Medicine

## 2018-05-31 ENCOUNTER — Other Ambulatory Visit: Payer: Self-pay

## 2018-05-31 VITALS — BP 128/74 | HR 86 | Temp 98.5°F | Ht 62.0 in | Wt 183.2 lb

## 2018-05-31 DIAGNOSIS — R7989 Other specified abnormal findings of blood chemistry: Secondary | ICD-10-CM

## 2018-05-31 DIAGNOSIS — R6889 Other general symptoms and signs: Secondary | ICD-10-CM | POA: Diagnosis not present

## 2018-05-31 DIAGNOSIS — E2839 Other primary ovarian failure: Secondary | ICD-10-CM

## 2018-05-31 DIAGNOSIS — R635 Abnormal weight gain: Secondary | ICD-10-CM

## 2018-05-31 DIAGNOSIS — T50905A Adverse effect of unspecified drugs, medicaments and biological substances, initial encounter: Secondary | ICD-10-CM | POA: Insufficient documentation

## 2018-05-31 DIAGNOSIS — R748 Abnormal levels of other serum enzymes: Secondary | ICD-10-CM

## 2018-05-31 LAB — COMPREHENSIVE METABOLIC PANEL
ALT: 27 U/L (ref 0–35)
AST: 19 U/L (ref 0–37)
Albumin: 4.5 g/dL (ref 3.5–5.2)
Alkaline Phosphatase: 133 U/L — ABNORMAL HIGH (ref 39–117)
BUN: 19 mg/dL (ref 6–23)
CO2: 30 mEq/L (ref 19–32)
Calcium: 9.5 mg/dL (ref 8.4–10.5)
Chloride: 99 mEq/L (ref 96–112)
Creatinine, Ser: 0.89 mg/dL (ref 0.40–1.20)
GFR: 62.93 mL/min (ref >60.00)
Glucose, Bld: 101 mg/dL — ABNORMAL HIGH (ref 70–99)
Potassium: 4.3 mEq/L (ref 3.5–5.1)
Sodium: 138 mEq/L (ref 135–145)
Total Bilirubin: 0.5 mg/dL (ref 0.2–1.2)
Total Protein: 8 g/dL (ref 6.0–8.3)

## 2018-05-31 LAB — GAMMA GT: GGT: 32 U/L (ref 7–51)

## 2018-05-31 LAB — T4, FREE: Free T4: 0.56 ng/dL — ABNORMAL LOW (ref 0.60–1.60)

## 2018-05-31 LAB — TSH: TSH: 2.74 u[IU]/mL (ref 0.35–4.50)

## 2018-05-31 MED ORDER — DEXAMETHASONE 1 MG PO TABS: 1.0000 mg | ORAL_TABLET | Freq: Once | ORAL | 0 refills | Status: AC

## 2018-05-31 NOTE — Patient Instructions (Addendum)
Instructions for Dexamethasone Suppression Test   Step 1: Choose a morning when you can come to our lab at 8:00 am for a blood draw.   Step 2: On the night before the blood draw, take 1 mg tablet of dexamethasone at 11:30 pm.  The timing is VERY important!   Step 3: The next morning, go to the lab for blood work at 8:00 am.  Dennis Bast do not have to be on an empty stomach, but the timing is VERY important!

## 2018-05-31 NOTE — Progress Notes (Signed)
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 female with a past medical history of Dyslipidemia, HTN, GAD, PTSD, GERD, fibromyalgia and OA . The patient presented for initial endocrinology clinic visit on 05/31/2018 for consultative assistance with her Cushing phenomenon .   Pt presented to Rheumatology  in March, 2020 for evaluation of autoimmune disease, and had endorsed weight gain of 30-35 lbs over the past year and was noted to have centripetal fat distribution and a buffalo hump, she was sent her for further evaluation of cushing syndrome.  She was started on Lamictal and Zoloft sometime in March, 2020  Today she continues to  c/o chills(goosebumps sensations that starts in her head and goes down her body for the past 2.5 months but without the feeling of the cold.   She denies glucose intolerance. She has hypertension but this is well controlled on current antihypertensive meds.  No stretch marks, no easy bruisability.   Right knee intra-articular injection last Thursday otherwise she has not received any other forms of glucocorticoids.    Had a DXA scan years ago which was normal.  She is not on biotin.    Marland Kitchen  HISTORY:  Past Medical History:  Past Medical History:  Diagnosis Date  . Burning mouth syndrome   . CAD (coronary artery disease)   . Cancer (HCC)    basal and squamous cell  . Depression   . GERD (gastroesophageal reflux disease)   . Hyperlipidemia   . Hypertension   . Thyroid disease    Past Surgical History:  Past Surgical History:  Procedure Laterality Date  . BREAST BIOPSY Right    Neg  . CESAREAN SECTION        Social History:  reports that she quit smoking about 28 years ago. She has never used smokeless tobacco. She reports  current alcohol use. She reports that she does not use drugs.  Family History: family history includes COPD in her father; Cancer in her brother; Cerebral aneurysm (age of onset: 22) in her mother; Lupus in an other family member.   HOME MEDICATIONS: Allergies as of 05/31/2018      Reactions   Codeine Itching   Tylenol [acetaminophen] Itching      Medication List       Accurate as of May 31, 2018  2:15 PM. Always use your most recent med list.        amLODipine 5 MG tablet Commonly known as:  NORVASC Take 1 tablet (5 mg total) by mouth daily.   aspirin EC 81 MG tablet Take 81 mg by mouth daily.   atorvastatin 40 MG tablet Commonly known as:  LIPITOR Take 40 mg by mouth daily.   carvedilol 3.125 MG tablet Commonly known as:  COREG Take 1 tablet (3.125 mg total) by mouth 2 (two) times daily with a meal.   clonazePAM 0.5 MG tablet Commonly known as:  KLONOPIN TAKE 1 TABLET (0.5 MG TOTAL) BY MOUTH AS NEEDED.   dexamethasone 1 MG tablet Commonly known as:  DECADRON Take 1 tablet (1 mg total) by mouth once for 1 dose.   DULoxetine 60 MG capsule Commonly known as:  CYMBALTA Take 60 mg by mouth 2 (two) times daily.  gabapentin 300 MG capsule Commonly known as:  NEURONTIN Take by mouth.   lamoTRIgine 25 MG tablet Commonly known as:  LAMICTAL Take 25 mg by mouth daily.   losartan 25 MG tablet Commonly known as:  COZAAR Take 1 tablet (25 mg total) by mouth daily.   QUEtiapine 25 MG tablet Commonly known as:  SEROQUEL Take 75-100 mg by mouth at bedtime.   sertraline 50 MG tablet Commonly known as:  ZOLOFT Take 50 mg by mouth daily.         REVIEW OF SYSTEMS: A comprehensive ROS was conducted with the patient and is negative except as per HPI and below:  Review of Systems  Constitutional: Negative for fever, malaise/fatigue and weight loss.  HENT: Negative for congestion and sore throat.   Eyes: Negative for blurred vision and pain.  Respiratory:  Negative for cough and shortness of breath.   Cardiovascular: Negative for chest pain and palpitations.  Gastrointestinal: Negative for diarrhea and nausea.  Genitourinary: Negative for frequency.  Musculoskeletal: Positive for joint pain.       Right knee   Skin: Negative.   Neurological: Positive for sensory change. Negative for weakness.  Endo/Heme/Allergies: Negative for polydipsia.  Psychiatric/Behavioral: Positive for depression. The patient is not nervous/anxious.        OBJECTIVE:  VS: BP 128/74 (BP Location: Left Arm, Patient Position: Sitting, Cuff Size: Normal)   Pulse 86   Temp 98.5 F (36.9 C)   Ht _0  (1.575 m)   Wt 183 lb 3.2 oz (83.1 kg)   SpO2 99%   BMI 33.51 kg/m    Wt Readings from Last 3 Encounters:  05/31/18 183 lb 3.2 oz (83.1 kg)  04/26/18 185 lb (83.9 kg)  03/11/18 181 lb 12.8 oz (82.5 kg)     EXAM: General: Pt appears well and is in NAD  Hydration: Well-hydrated with moist mucous membranes and good skin turgor  Eyes: External eye exam normal without stare, lid lag or exophthalmos.  EOM intact.  PERRL.  Ears, Nose, Throat: Hearing: Grossly intact bilaterally Dental: Good dentition  Throat: Clear without mass, erythema or exudate  Neck: General: Supple without adenopathy. Thyroid: Thyroid size normal.  No goiter or nodules appreciated. No thyroid bruit.  Lungs: Clear with good BS bilat with no rales, rhonchi, or wheezes  Heart: Auscultation: RRR.  Abdomen: Normoactive bowel sounds, soft, nontender, without masses or organomegaly palpable  Extremities: BL LE: No pretibial edema normal ROM and strength.  Skin: Hair: Texture and amount normal with gender appropriate distribution Skin Inspection: No rashes. Skin Palpation: Skin temperature, texture, and thickness normal to palpation  Neuro: Cranial nerves: II - XII grossly intact  Motor: Normal strength throughout DTRs: 2+ and symmetric in UE without delay in relaxation phase  Mental Status:  Judgment, insight: Intact Orientation: Oriented to time, place, and person Mood and affect: No depression, anxiety, or agitation  Pt has been able to stand up without assistance 3x from the chair.    DATA REVIEWED: 05/11/2018  Cortisol 11.9 ug/dL  TSH 2.910 uIU/mL  T4 5.6 ug/dL  T3 uptake 16 ( 24-39) % FT index 0.9 (1.2-4.9 ) Gluc 115 mg/dL  GFR 72 Na 140 mmol/L K 4.2 mmol/L  Ca 9.7 mg/dL (corrected 9.3) Alk Phos 148 (39-117) AST 22 IU/L ALT 28 IU/L   H/H 13.2 / 38.6      Results for SAREE, KROGH (MRN [id number]) as of 06/01/2018 11:45  Ref. Range 05/31/2018 14:03  Sodium Latest Ref Range: 135 -  145 mEq/L 138  Potassium Latest Ref Range: 3.5 - 5.1 mEq/L 4.3  Chloride Latest Ref Range: 96 - 112 mEq/L 99  CO2 Latest Ref Range: 19 - 32 mEq/L 30  Glucose Latest Ref Range: 70 - 99 mg/dL 101 (H)  BUN Latest Ref Range: 6 - 23 mg/dL 19  Creatinine Latest Ref Range: 0.40 - 1.20 mg/dL 0.89  Calcium Latest Ref Range: 8.4 - 10.5 mg/dL 9.5  Alkaline Phosphatase Latest Ref Range: 39 - 117 U/L 133 (H)  Albumin Latest Ref Range: 3.5 - 5.2 g/dL 4.5  AST Latest Ref Range: 0 - 37 U/L 19  ALT Latest Ref Range: 0 - 35 U/L 27  Total Protein Latest Ref Range: 6.0 - 8.3 g/dL 8.0  Total Bilirubin Latest Ref Range: 0.2 - 1.2 mg/dL 0.5  GGT Latest Ref Range: 7 - 51 U/L 32  GFR Latest Ref Range: >60.00 mL/min 62.93  TSH Latest Ref Range: 0.35 - 4.50 uIU/mL 2.74  Triiodothyronine (T3) Latest Ref Range: 76 - 181 ng/dL 119  T4,Free(Direct) Latest Ref Range: 0.60 - 1.60 ng/dL 0.56 (L)       ASSESSMENT/PLAN/RECOMMENDATIONS:   1. Cushing phenomenon   - I have a very low suspicion for cushing syndrome in this patient. Her weight gain is mostly related to her psychiatric medications.  - We will proceed with dexamethasone suppression test, pt advised to wait another 3 weeks prior to proceeding due to recent right knee intra-articular injection.    2. Elevated Alkaline Phosphatase:   - We  discussed this could originate from bone vs liver, will check GGT first , if this is normal it means the Alk Phos elevation has originated from the bone . - GGt is normal, this is most likely originating from the bones, will proceed with  DXA as it could be a sign of high bone turnover.  - I was unable to check Vitamin D , as her insurance won;t cover with current diagnoses, will consider checking it if her DXA is abnormal.    3. Sensory Episodes :   - It seems her work up so far has been unrevealing. No structural evidence of abnormality thus far, she has seen her PCP, neurology, dermatology and rheumatology, she also sees a psychiatrist, she does not believe this is related to zoloft no lamictal as these sensations started prior.  - I do believe this is triggered by emotional stress, because she had an episode during her visit while talking about the high death rate in [place] caused by COVID-19  - I have advised her next time she gets one of these episodes to try and recall triggering factors whether its something she heard, done or talked about.  - So far she has noted that Klonopin helps  - She initially though Gabapentin has helped  But now she doesn't believe so. I would defer discontinuing Gabapentin to her PCP if its truly not helping her, as this is another contributory factor in her weight gain .    4. Abnormal TFT 's   - Clinically she is euthyroid except for the weight gain - Repeat TFT's today show normal TSH, T3 but low FT4. This could be a lab error vs a true low T4, I will repeat this in next few months to confirm results.      F/u 3 months   Addendum: Discussed above results and recommendations with the pt over the phone on 06/01/2018 @ 11 AM.  Signed electronically by: Mack Guise, MD  [facility] Endocrinology  [facility] [address] [place], [place] [address] Phone: [phone] FAX: [phone]   CC: Leone Haven, MD [address] [place] [address] Phone: [phone] Fax: [phone]   Return to Endocrinology clinic as below: Future Appointments  Date Time Provider [place]  06/28/2018  2:45 PM [facility] None  08/26/2018  3:00 PM Tat, Eustace Quail, DO [facility] None  09/12/2018  1:15 PM Caryl Bis Angela Adam, MD [facility]

## 2018-06-01 LAB — T3: T3, Total: 119 ng/dL (ref 76–181)

## 2018-06-08 DIAGNOSIS — F4312 Post-traumatic stress disorder, chronic: Secondary | ICD-10-CM | POA: Diagnosis not present

## 2018-06-08 DIAGNOSIS — F39 Unspecified mood [affective] disorder: Secondary | ICD-10-CM | POA: Diagnosis not present

## 2018-06-08 DIAGNOSIS — F411 Generalized anxiety disorder: Secondary | ICD-10-CM | POA: Diagnosis not present

## 2018-06-08 DIAGNOSIS — F5105 Insomnia due to other mental disorder: Secondary | ICD-10-CM | POA: Diagnosis not present

## 2018-06-09 ENCOUNTER — Telehealth: Payer: Self-pay

## 2018-06-09 NOTE — Telephone Encounter (Signed)
I don't believe we are seeing patients at 4 pm at this time. She can be moved to earlier in the day and the 4 pm slot needs to be blocked. Thanks.

## 2018-06-09 NOTE — Telephone Encounter (Signed)
Copied from [place] [id number]. Topic: General - Other >> Jun 08, 2018  3:50 PM Richardo Priest, [place] wrote: Reason for CRM: Patient called in stating she developed a tingle in head that goes through body, however is not having chills. Patient stated she is taking the medication, clonazePAM (KLONOPIN) 0.5 MG tablet to help with the "tingle" and is requesting to take a Klonopin once a day. Call [phone]

## 2018-06-09 NOTE — Telephone Encounter (Signed)
Rescheduled patient's appointment to 06/21/18 @ 1:45 pm.

## 2018-06-09 NOTE — Telephone Encounter (Signed)
Called and spoke to patient.  Patient c/o of having a tingling feeling in her head.  Patient said that at times it gets unbearable.  Patient said that she has seen an endocrinologist, a neurologist, and has had a CT scan but no medical reason has been found.  Pt said that she saw her psychiatrist on yesterday and was recommended to contact PCP regarding Klonopin medication.  Pt stated that she currently takes 0.5 mg of Klonopin as needed for Burning Mouth Syndrome but has recently been taking it for the tingling in her head and said that it helps with those symptoms.  Pt is requesting to take Klonopin everyday.  Pt scheduled for next available appt w/ PCP on 06/21/18 @ 4:00 pm.  Pt advised to call back if symptoms worsen to see if she can be worked into schedule sooner.

## 2018-06-09 NOTE — Telephone Encounter (Signed)
We would need to discuss this during a visit. I can see her 06/20/18 at 3:15 for this issue.

## 2018-06-10 ENCOUNTER — Ambulatory Visit: Payer: Medicare Other | Admitting: Internal Medicine

## 2018-06-21 ENCOUNTER — Telehealth: Payer: Self-pay | Admitting: Family Medicine

## 2018-06-21 ENCOUNTER — Other Ambulatory Visit: Payer: Self-pay

## 2018-06-21 ENCOUNTER — Ambulatory Visit: Payer: Medicare Other | Admitting: Family Medicine

## 2018-06-21 ENCOUNTER — Encounter: Payer: Self-pay | Admitting: Family Medicine

## 2018-06-21 ENCOUNTER — Ambulatory Visit (INDEPENDENT_AMBULATORY_CARE_PROVIDER_SITE_OTHER): Payer: Medicare Other | Admitting: Family Medicine

## 2018-06-21 DIAGNOSIS — M25569 Pain in unspecified knee: Secondary | ICD-10-CM

## 2018-06-21 DIAGNOSIS — R202 Paresthesia of skin: Secondary | ICD-10-CM | POA: Insufficient documentation

## 2018-06-21 DIAGNOSIS — E785 Hyperlipidemia, unspecified: Secondary | ICD-10-CM | POA: Diagnosis not present

## 2018-06-21 DIAGNOSIS — I10 Essential (primary) hypertension: Secondary | ICD-10-CM | POA: Diagnosis not present

## 2018-06-21 MED ORDER — CLONAZEPAM 0.5 MG PO TABS: 1.0000 mg | ORAL_TABLET | Freq: Every day | ORAL | 0 refills | Status: DC | PRN

## 2018-06-21 NOTE — Assessment & Plan Note (Signed)
Related to osteoarthritis.  Has improved with a steroid injection.

## 2018-06-21 NOTE — Telephone Encounter (Signed)
Please contact the patient and get her set up for follow-up in the office in 3 to 4 months.  Thanks.

## 2018-06-21 NOTE — Assessment & Plan Note (Signed)
Patient has an odd constellation of symptoms.  She has had an extensive evaluation with multiple specialists with no specific organic cause noted.  I suspect this is a psychological issue and may improve with the addition of medications added by her psychiatrist.  I discussed that we could continue the clonazepam 1 mg daily for a short period of time.  I will provide a refill on this.  She will monitor her use of this.  We will reassess in about a month when she requests a refill.  Follow-up in the office in 3 to 4 months.  We will additionally taper her off of gabapentin given lack of benefit.  I will check with our clinical pharmacist to determine the best way to do this.

## 2018-06-21 NOTE — Progress Notes (Signed)
Virtual Visit via video Note  This visit type was conducted due to national recommendations for restrictions regarding the COVID-19 pandemic (e.g. social distancing).  This format is felt to be most appropriate for this patient at this time.  All issues noted in this document were discussed and addressed.  No physical exam was performed (except for noted visual exam findings with Video Visits).   I connected with Nicole Washington today at  1:45 PM EDT by a video enabled telemedicine application and verified that I am speaking with the correct person using two identifiers. Location patient: home Location provider: work Persons participating in the virtual visit: patient, provider  I discussed the limitations, risks, security and privacy concerns of performing an evaluation and management service by telephone and the availability of in person appointments. I also discussed with the patient that there may be a patient responsible charge related to this service. The patient expressed understanding and agreed to proceed.   Reason for visit: follow-up  HPI: Tingling: Patient notes this has been going on for 3 months.  She notes it starts in her head and goes throughout her body.  She has seen neurology and underwent a CT scan and EEG with no cause.  They did not feel this was a primary neurological issue.  She has seen dermatology and they placed her on gabapentin which the patient is unsure whether or not this has been beneficial.  She saw rheumatology and she had a negative evaluation.  She saw endocrinology and they felt it was psychological.  The patient has followed up with her psychiatrist and is now on Lamictal and Zoloft in addition to Cymbalta and Seroquel.  The patient has noted that the symptoms improve when she takes 1 mg of Klonopin.  Symptoms occur daily typically in the late afternoon.  She will develop tingling and then chills.  She notes the symptoms do not recur after taking the clonazepam.   She feels it is a psychological issue as well.  Hypertension: Typically 120s over 70s.  Taking carvedilol, losartan, and amlodipine.  No chest pain or shortness of breath.  No edema.  Hyperlipidemia: Taking Lipitor.  No right upper quadrant pain or myalgias.  Knee pain: Patient has received a steroid injection with good benefit.   ROS: See pertinent positives and negatives per HPI.  Past Medical History:  Diagnosis Date  . Burning mouth syndrome   . CAD (coronary artery disease)   . Cancer (HCC)    basal and squamous cell  . Depression   . GERD (gastroesophageal reflux disease)   . Hyperlipidemia   . Hypertension   . Thyroid disease     Past Surgical History:  Procedure Laterality Date  . BREAST BIOPSY Right    Neg  . CESAREAN SECTION      Family History  Problem Relation Age of Onset  . Cerebral aneurysm Mother 31  . COPD Father   . Cancer Brother        ? colon  . Lupus Other   . Breast cancer Neg Hx     SOCIAL HX: Former smoker   Current Outpatient Medications:  .  amLODipine (NORVASC) 5 MG tablet, Take 1 tablet (5 mg total) by mouth daily., Disp: 90 tablet, Rfl: 1 .  aspirin EC 81 MG tablet, Take 81 mg by mouth daily., Disp: , Rfl:  .  atorvastatin (LIPITOR) 40 MG tablet, Take 40 mg by mouth daily., Disp: , Rfl:  .  carvedilol (COREG) 3.125  MG tablet, Take 1 tablet (3.125 mg total) by mouth 2 (two) times daily with a meal., Disp: 180 tablet, Rfl: 1 .  clonazePAM (KLONOPIN) 0.5 MG tablet, Take 2 tablets (1 mg total) by mouth daily as needed for anxiety., Disp: 60 tablet, Rfl: 0 .  DULoxetine (CYMBALTA) 60 MG capsule, Take 60 mg by mouth 2 (two) times daily., Disp: , Rfl:  .  gabapentin (NEURONTIN) 300 MG capsule, Take by mouth., Disp: , Rfl:  .  lamoTRIgine (LAMICTAL) 25 MG tablet, Take 25 mg by mouth daily., Disp: , Rfl:  .  losartan (COZAAR) 25 MG tablet, Take 1 tablet (25 mg total) by mouth daily., Disp: 90 tablet, Rfl: 1 .  QUEtiapine (SEROQUEL) 25 MG  tablet, Take 75-100 mg by mouth at bedtime. , Disp: , Rfl:  .  sertraline (ZOLOFT) 50 MG tablet, Take 50 mg by mouth daily., Disp: , Rfl:   EXAM:  VITALS per patient if applicable: None  GENERAL: alert, oriented, appears well and in no acute distress  HEENT: atraumatic, conjunttiva clear, no obvious abnormalities on inspection of external nose and ears  NECK: normal movements of the head and neck  LUNGS: on inspection no signs of respiratory distress, breathing rate appears normal, no obvious gross SOB, gasping or wheezing  CV: no obvious cyanosis  MS: moves all visible extremities without noticeable abnormality  PSYCH/NEURO: pleasant and cooperative, no obvious depression or anxiety, speech and thought processing grossly intact  ASSESSMENT AND PLAN:  Discussed the following assessment and plan:  Tingling  Essential hypertension  Hyperlipidemia, unspecified hyperlipidemia type  Knee pain, unspecified chronicity, unspecified laterality  Tingling Patient has an odd constellation of symptoms.  She has had an extensive evaluation with multiple specialists with no specific organic cause noted.  I suspect this is a psychological issue and may improve with the addition of medications added by her psychiatrist.  I discussed that we could continue the clonazepam 1 mg daily for a short period of time.  I will provide a refill on this.  She will monitor her use of this.  We will reassess in about a month when she requests a refill.  Follow-up in the office in 3 to 4 months.  We will additionally taper her off of gabapentin given lack of benefit.  I will check with our clinical pharmacist to determine the best way to do this.  Hypertension Well-controlled.  Continue current regimen.  Hyperlipidemia Continue Lipitor.  Plan for lipid panel next visit.  Knee pain Related to osteoarthritis.  Has improved with a steroid injection.   Social distancing precautions and sick precautions  given regarding COVID-19.   I discussed the assessment and treatment plan with the patient. The patient was provided an opportunity to ask questions and all were answered. The patient agreed with the plan and demonstrated an understanding of the instructions.   The patient was advised to call back or seek an in-person evaluation if the symptoms worsen or if the condition fails to improve as anticipated.   Tommi Rumps, MD

## 2018-06-21 NOTE — Telephone Encounter (Signed)
Called pt and left a VM to call back. CRM created and sent to PCP pool.

## 2018-06-21 NOTE — Assessment & Plan Note (Signed)
Continue Lipitor.  Plan for lipid panel next visit.

## 2018-06-21 NOTE — Progress Notes (Signed)
Pt would like to discuss the clonazepam and discuss dose increase - pt is c/o tingling that starts at her head and goes through her body start about 3 months ago.   Pt is now on lamictal and zoloft.

## 2018-06-21 NOTE — Assessment & Plan Note (Signed)
Well-controlled.  Continue current regimen. 

## 2018-06-22 NOTE — Telephone Encounter (Signed)
Called and spoke with pt's husband and advised for his wife to call me on firday on my direct number to be scheduled for her 3-4 month follow up appt.

## 2018-06-24 NOTE — Telephone Encounter (Signed)
Done pt already has an appt in July

## 2018-06-25 ENCOUNTER — Telehealth: Payer: Self-pay | Admitting: Family Medicine

## 2018-06-25 NOTE — Telephone Encounter (Signed)
Please let the patient know I heard back from our clinical pharmacist.  She noted the patient could discontinue the gabapentin without tapering.  Thanks.

## 2018-06-25 NOTE — Telephone Encounter (Signed)
-----   Message from De Hollingshead, [facility] sent at 06/22/2018  3:57 PM EDT ----- If she was on it for seizures, I'd think about tapering, but since she's just on it for pain you can stop it. Even if it was for pain and she was taking a higher dose, I might think about tapering? But not in this situation.   Catie ----- Message ----- From: Leone Haven, MD Sent: 06/21/2018   2:14 PM EDT To: De Hollingshead, [facility]  Catie,   I want to taper this patient off her gabapentin. She is on 300 mg BID. Do I need to taper? If so would it be adequate to decrease to 300 mg daily for 1-2 weeks and then discontinue? She was started on this in December. Thanks.  Randall Hiss

## 2018-06-27 ENCOUNTER — Other Ambulatory Visit: Payer: Self-pay | Admitting: Internal Medicine

## 2018-06-27 DIAGNOSIS — R635 Abnormal weight gain: Secondary | ICD-10-CM

## 2018-06-27 DIAGNOSIS — T50905A Adverse effect of unspecified drugs, medicaments and biological substances, initial encounter: Secondary | ICD-10-CM

## 2018-06-27 NOTE — Telephone Encounter (Signed)
Called and spoke with pt. Pt advised and voiced understanding.  

## 2018-06-27 NOTE — Progress Notes (Signed)
cortiso

## 2018-06-28 ENCOUNTER — Other Ambulatory Visit: Payer: Self-pay

## 2018-06-28 ENCOUNTER — Other Ambulatory Visit (INDEPENDENT_AMBULATORY_CARE_PROVIDER_SITE_OTHER): Payer: Medicare Other

## 2018-06-28 DIAGNOSIS — R635 Abnormal weight gain: Secondary | ICD-10-CM

## 2018-06-28 DIAGNOSIS — T50905A Adverse effect of unspecified drugs, medicaments and biological substances, initial encounter: Secondary | ICD-10-CM

## 2018-06-28 LAB — CORTISOL: Cortisol, Plasma: 1.2 ug/dL

## 2018-06-29 ENCOUNTER — Encounter: Payer: Self-pay | Admitting: Internal Medicine

## 2018-07-01 DIAGNOSIS — F411 Generalized anxiety disorder: Secondary | ICD-10-CM | POA: Diagnosis not present

## 2018-07-01 DIAGNOSIS — F5105 Insomnia due to other mental disorder: Secondary | ICD-10-CM | POA: Diagnosis not present

## 2018-07-01 DIAGNOSIS — F39 Unspecified mood [affective] disorder: Secondary | ICD-10-CM | POA: Diagnosis not present

## 2018-07-01 DIAGNOSIS — F4312 Post-traumatic stress disorder, chronic: Secondary | ICD-10-CM | POA: Diagnosis not present

## 2018-07-25 DIAGNOSIS — M1711 Unilateral primary osteoarthritis, right knee: Secondary | ICD-10-CM | POA: Diagnosis not present

## 2018-08-02 ENCOUNTER — Other Ambulatory Visit: Payer: Self-pay | Admitting: Family Medicine

## 2018-08-26 ENCOUNTER — Encounter

## 2018-08-26 ENCOUNTER — Ambulatory Visit: Payer: Medicare Other | Admitting: Neurology

## 2018-08-30 ENCOUNTER — Telehealth: Payer: Self-pay

## 2018-08-30 NOTE — Telephone Encounter (Signed)
Patient called today and stated she was given abnormal lab results and wants to know if she needs to be seen when she comes back in 3 months for labs or will she just get the labs drawn again without having a visit-please call patient and let her know what she will for f/u

## 2018-08-31 NOTE — Telephone Encounter (Signed)
I do not see any recent lab results for pt so I lft vm for her to return call to discuss

## 2018-09-12 ENCOUNTER — Other Ambulatory Visit: Payer: Self-pay

## 2018-09-12 ENCOUNTER — Encounter: Payer: Self-pay | Admitting: Family Medicine

## 2018-09-12 ENCOUNTER — Ambulatory Visit (INDEPENDENT_AMBULATORY_CARE_PROVIDER_SITE_OTHER): Payer: Medicare Other | Admitting: Family Medicine

## 2018-09-12 DIAGNOSIS — M25569 Pain in unspecified knee: Secondary | ICD-10-CM | POA: Diagnosis not present

## 2018-09-12 DIAGNOSIS — I10 Essential (primary) hypertension: Secondary | ICD-10-CM

## 2018-09-12 DIAGNOSIS — R202 Paresthesia of skin: Secondary | ICD-10-CM

## 2018-09-12 DIAGNOSIS — K219 Gastro-esophageal reflux disease without esophagitis: Secondary | ICD-10-CM | POA: Diagnosis not present

## 2018-09-12 DIAGNOSIS — K146 Glossodynia: Secondary | ICD-10-CM | POA: Diagnosis not present

## 2018-09-12 NOTE — Assessment & Plan Note (Signed)
Adequately controlled.  Continue current regimen. 

## 2018-09-12 NOTE — Assessment & Plan Note (Signed)
Chronic issue.  She will continue to see Dr. Alfonso Ramus.

## 2018-09-12 NOTE — Progress Notes (Signed)
Virtual Visit via video Note  This visit type was conducted due to national recommendations for restrictions regarding the COVID-19 pandemic (e.g. social distancing).  This format is felt to be most appropriate for this patient at this time.  All issues noted in this document were discussed and addressed.  No physical exam was performed (except for noted visual exam findings with Video Visits).   I connected with Nicole Washington today at  1:15 PM EDT by a video enabled telemedicine application and verified that I am speaking with the correct person using two identifiers. Location patient: home Location provider: work Persons participating in the virtual visit: patient, provider  I discussed the limitations, risks, security and privacy concerns of performing an evaluation and management service by telephone and the availability of in person appointments. I also discussed with the patient that there may be a patient responsible charge related to this service. The patient expressed understanding and agreed to proceed.   Reason for visit: follow-up  HPI: HYPERTENSION  Disease Monitoring  Home BP Monitoring 128/74 Chest pain- no    Dyspnea- no Medications  Compliance-  Taking coreg, losartan, amlodipine. She reports she is having no cardiac issues though she is establishing with a cardiologist later this week.  GERD:   Reflux symptoms: mild at night when lays down, has burning and sour taste in her throat and mouth   Abd pain: no   Blood in stool: no  Dysphagia: no    Medication: no medication  Head tingling: Patient notes this continues to be an issue.  She can not touch the back of her neck without setting this off.  She has seen numerous specialists (neurology, endocrinology, rheumatology, and dermatology) and most recently discussed this with her psychiatrist who also felt that this was psychologically related.  She feels as though the gabapentin and Klonopin in combination helps with  this.  No drowsiness with these medications.  She is on Cymbalta, Lamictal, Seroquel, and Zoloft as well.  No anxiety or depression.  No SI.  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  She has not had to take the clonazepam for this in quite some time.  Right knee pain: She has seen Dr. Alfonso Ramus for this.  She has gotten 2 steroid shots and had fluid drained though she continues to have pain and swelling.  She notes he advised her that the next step would be an MRI and potentially arthroscopic surgery.  ROS: See pertinent positives and negatives per HPI.  Past Medical History:  Diagnosis Date  . Burning mouth syndrome   . CAD (coronary artery disease)   . Cancer (HCC)    basal and squamous cell  . Depression   . GERD (gastroesophageal reflux disease)   . Hyperlipidemia   . Hypertension   . Thyroid disease     Past Surgical History:  Procedure Laterality Date  . BREAST BIOPSY Right    Neg  . CESAREAN SECTION      Family History  Problem Relation Age of Onset  . Cerebral aneurysm Mother 3  . COPD Father   . Cancer Brother        ? colon  . Lupus Other   . Breast cancer Neg Hx     SOCIAL HX: Former smoker.   Current Outpatient Medications:  .  amLODipine (NORVASC) 5 MG tablet, Take 1 tablet (5 mg total) by mouth daily., Disp: 90 tablet, Rfl: 1 .  aspirin EC 81  MG tablet, Take 81 mg by mouth daily., Disp: , Rfl:  .  atorvastatin (LIPITOR) 40 MG tablet, Take 40 mg by mouth daily., Disp: , Rfl:  .  carvedilol (COREG) 3.125 MG tablet, Take 1 tablet (3.125 mg total) by mouth 2 (two) times daily with a meal., Disp: 180 tablet, Rfl: 1 .  clonazePAM (KLONOPIN) 0.5 MG tablet, TAKE 2 TABLETS (1 MG TOTAL) BY MOUTH DAILY AS NEEDED FOR ANXIETY., Disp: 60 tablet, Rfl: 0 .  DULoxetine (CYMBALTA) 60 MG capsule, Take 60 mg by mouth 2 (two) times daily., Disp: , Rfl:  .  gabapentin (NEURONTIN) 300 MG capsule, Take by mouth., Disp: , Rfl:  .  lamoTRIgine  (LAMICTAL) 25 MG tablet, Take 25 mg by mouth daily., Disp: , Rfl:  .  losartan (COZAAR) 25 MG tablet, Take 1 tablet (25 mg total) by mouth daily., Disp: 90 tablet, Rfl: 1 .  QUEtiapine (SEROQUEL) 25 MG tablet, Take 75-100 mg by mouth at bedtime. , Disp: , Rfl:  .  sertraline (ZOLOFT) 50 MG tablet, Take 50 mg by mouth daily., Disp: , Rfl:   EXAM:  VITALS per patient if applicable: None.  GENERAL: alert, oriented, appears well and in no acute distress  HEENT: atraumatic, conjunttiva clear, no obvious abnormalities on inspection of external nose and ears  NECK: normal movements of the head and neck  LUNGS: on inspection no signs of respiratory distress, breathing rate appears normal, no obvious gross SOB, gasping or wheezing  CV: no obvious cyanosis  MS: moves all visible extremities without noticeable abnormality  PSYCH/NEURO: pleasant and cooperative, no obvious depression or anxiety, speech and thought processing grossly intact  ASSESSMENT AND PLAN:  Discussed the following assessment and plan:  Hypertension Adequately controlled.  Continue current regimen.  Burning mouth syndrome Well-controlled.  Continue as needed clonazepam.  Knee pain Chronic issue.  She will continue to see Dr. Alfonso Ramus.  Tingling Potentially could be a psychiatric issue.  Improved with clonazepam and gabapentin.  She did have a mildly low free T4 and I did discuss that thyroid issues could potentially lead to the symptoms though I have not heard of tingling.  She will keep her appointment with endocrinology to have this rechecked.  I will have our clinical pharmacist review her medications to determine if anything could be contributing.  GERD (gastroesophageal reflux disease) Patient with nighttime reflux symptoms.  Discussed taking Pepcid 1 hour prior to dinner.  Advised on use of wedge pillow and foods that could contribute to symptoms and avoidance of those foods.  If symptoms are not improving with  Pepcid over the next 1 to 2 weeks she will let us know.   Social distancing precautions and sick precautions given regarding COVID-19.   I discussed the assessment and treatment plan with the patient. The patient was provided an opportunity to ask questions and all were answered. The patient agreed with the plan and demonstrated an understanding of the instructions.   The patient was advised to call back or seek an in-person evaluation if the symptoms worsen or if the condition fails to improve as anticipated.   Tommi Rumps, MD

## 2018-09-12 NOTE — Assessment & Plan Note (Signed)
Potentially could be a psychiatric issue.  Improved with clonazepam and gabapentin.  She did have a mildly low free T4 and I did discuss that thyroid issues could potentially lead to the symptoms though I have not heard of tingling.  She will keep her appointment with endocrinology to have this rechecked.  I will have our clinical pharmacist review her medications to determine if anything could be contributing.

## 2018-09-12 NOTE — Assessment & Plan Note (Signed)
Well-controlled.  Continue as needed clonazepam.

## 2018-09-12 NOTE — Assessment & Plan Note (Signed)
Patient with nighttime reflux symptoms.  Discussed taking Pepcid 1 hour prior to dinner.  Advised on use of wedge pillow and foods that could contribute to symptoms and avoidance of those foods.  If symptoms are not improving with Pepcid over the next 1 to 2 weeks she will let us know.

## 2018-09-13 ENCOUNTER — Other Ambulatory Visit: Payer: Self-pay | Admitting: Family Medicine

## 2018-09-14 ENCOUNTER — Encounter: Payer: Self-pay | Admitting: Cardiology

## 2018-09-14 ENCOUNTER — Ambulatory Visit (INDEPENDENT_AMBULATORY_CARE_PROVIDER_SITE_OTHER): Payer: Medicare Other | Admitting: Cardiology

## 2018-09-14 ENCOUNTER — Other Ambulatory Visit: Payer: Self-pay

## 2018-09-14 VITALS — BP 118/76 | HR 92 | Ht 62.0 in | Wt 186.8 lb

## 2018-09-14 DIAGNOSIS — E785 Hyperlipidemia, unspecified: Secondary | ICD-10-CM | POA: Diagnosis not present

## 2018-09-14 DIAGNOSIS — I2510 Atherosclerotic heart disease of native coronary artery without angina pectoris: Secondary | ICD-10-CM | POA: Diagnosis not present

## 2018-09-14 DIAGNOSIS — Z7189 Other specified counseling: Secondary | ICD-10-CM | POA: Diagnosis not present

## 2018-09-14 DIAGNOSIS — I10 Essential (primary) hypertension: Secondary | ICD-10-CM | POA: Diagnosis not present

## 2018-09-14 DIAGNOSIS — R0789 Other chest pain: Secondary | ICD-10-CM

## 2018-09-14 NOTE — Patient Instructions (Signed)
Medication Instructions:  Your physician recommends that you continue on your current medications as directed. Please refer to the Current Medication list given to you today.  If you need a refill on your cardiac medications before your next appointment, please call your pharmacy.   Lab work: NONE  If you have labs (blood work) drawn today and your tests are completely normal, you will receive your results only by: Marland Kitchen MyChart Message (if you have MyChart) OR . A paper copy in the mail If you have any lab test that is abnormal or we need to change your treatment, we will call you to review the results.  Testing/Procedures: NONE  Follow-Up: At [facility], you and your health needs are our priority.  As part of our continuing mission to provide you with exceptional heart care, we have created designated Provider Care Teams.  These Care Teams include your primary Cardiologist (physician) and Advanced Practice Providers (APPs -  Physician Assistants and Nurse Practitioners) who all work together to provide you with the care you need, when you need it. You will need a follow up appointment in AS NEEDED

## 2018-09-14 NOTE — Progress Notes (Signed)
Cardiology Office Note:    Date:  09/14/2018   ID:  Miguel Rota, DOB [birth year], MRN [id number]  PCP:  Leone Haven, MD  Cardiologist:  Buford Dresser, MD PhD  Referring MD: Leone Haven, MD   Chief Complaint  Patient presents with  . other    C/o having to take occassional deep breath. Meds reviewed verbally with pt.    History of Present Illness:    Nicole Washington is a 68 y.o. female with a hx of hypertension, nonobstructive CAD based on noninvasive imaging who is seen as a new consult at the request of Leone Haven, MD for the evaluation and management of intermittent chest pain and history of CAD.  Chest pain: -Initial onset: none recently, happened back in 01/2018 for a brief time and none since then. Has happened in the past as well and self resolved. -Quality: sharp pains, pinpricks -Frequency/duration: every few days, lasted 20 seconds -Associated symptoms: none -Aggravating/alleviating factors: no clear triggers, not exertional. Resolved without treatment.  -Prior cardiac history: saw Dr. Candiss Norse in [place]. Had stress test, echo. Told she had CAD, but no reported heart cath prior. She brings records with her today, reviewed: Received medical records. [facility] cardiology, [place], Dr. Judi Cong.  Had ct angio heart 08/07/2004 (report reviewed). Tiny amount of calcified plaque at the origin of LAD without significant stenosis. No other disease noted.   Myoview 2011 without ischemia.  Echo 9/-2016 with normal EF and no significant valve disease.  Has occasional shortness of breath with bending over, not severe. Occasionally feels like she has to catch her breath when speaking. Has gained some weight with coronavirus. No LE edema.   Gena Fray died of MI, no other known history of heart disease (father listed as heart attack, but had history of COPD on O2). Mother died of brain aneurysm.  Past Medical History:  Diagnosis Date  . Burning  mouth syndrome   . CAD (coronary artery disease)   . Cancer (HCC)    basal and squamous cell  . Depression   . GERD (gastroesophageal reflux disease)   . Hyperlipidemia   . Hypertension   . Thyroid disease     Past Surgical History:  Procedure Laterality Date  . BREAST BIOPSY Right    Neg  . CESAREAN SECTION      Current Medications: Current Outpatient Medications on File Prior to Visit  Medication Sig  . amLODipine (NORVASC) 5 MG tablet Take 1 tablet (5 mg total) by mouth daily.  Marland Kitchen aspirin EC 81 MG tablet Take 81 mg by mouth daily.  Marland Kitchen atorvastatin (LIPITOR) 40 MG tablet Take 40 mg by mouth daily.  . carvedilol (COREG) 3.125 MG tablet Take 1 tablet (3.125 mg total) by mouth 2 (two) times daily with a meal.  . DULoxetine (CYMBALTA) 60 MG capsule Take 60 mg by mouth 2 (two) times daily.  Marland Kitchen gabapentin (NEURONTIN) 300 MG capsule Take 300 mg by mouth daily.   Marland Kitchen lamoTRIgine (LAMICTAL) 25 MG tablet Take 25 mg by mouth daily.  Marland Kitchen losartan (COZAAR) 25 MG tablet Take 1 tablet (25 mg total) by mouth daily.  . QUEtiapine (SEROQUEL) 25 MG tablet Take 50 mg by mouth at bedtime.   . sertraline (ZOLOFT) 50 MG tablet Take 50 mg by mouth daily.  . clonazePAM (KLONOPIN) 0.5 MG tablet TAKE 2 TABLETS (1 MG TOTAL) BY MOUTH DAILY AS NEEDED FOR ANXIETY.   No current facility-administered medications on file prior to visit.  Allergies:   Codeine and Tylenol [acetaminophen]   Social History   Socioeconomic History  . Marital status: Married    Spouse name: Not on file  . Number of children: Not on file  . Years of education: Not on file  . Highest education level: Not on file  Occupational History  . Not on file  Social Needs  . Financial resource strain: Not on file  . Food insecurity    Worry: Not on file    Inability: Not on file  . Transportation needs    Medical: Not on file    Non-medical: Not on file  Tobacco Use  . Smoking status: Former Smoker    Quit date: 12/20/1989     Years since quitting: 28.7  . Smokeless tobacco: Never Used  Substance and Sexual Activity  . Alcohol use: Yes    Comment: 4 times a year  . Drug use: No  . Sexual activity: Not on file  Lifestyle  . Physical activity    Days per week: Not on file    Minutes per session: Not on file  . Stress: Not on file  Relationships  . Social Herbalist on phone: Not on file    Gets together: Not on file    Attends religious service: Not on file    Active member of club or organization: Not on file    Attends meetings of clubs or organizations: Not on file    Relationship status: Not on file  Other Topics Concern  . Not on file  Social History Narrative  . Not on file     Family History: The patient's family history includes COPD in her father; Cancer in her brother; Cerebral aneurysm (age of onset: 31) in her mother; Heart Problems in her paternal grandfather; Lupus in an other family member. There is no history of Breast cancer.  ROS:   Please see the history of present illness.  Additional pertinent ROS:  Constitutional: Negative for chills, fever, night sweats, unintentional weight loss  HENT: Negative for ear pain and hearing loss.   Eyes: Negative for loss of vision and eye pain.  Respiratory: Negative for cough, sputum, shortness of breath, wheezing.   Cardiovascular: See HPI. Gastrointestinal: Negative for abdominal pain, melena, and hematochezia.  Genitourinary: Negative for dysuria and hematuria.  Musculoskeletal: Negative for falls and myalgias.  Skin: Negative for itching and rash.  Neurological: Negative for focal weakness, focal sensory changes and loss of consciousness.  Endo/Heme/Allergies: Does not bruise/bleed easily.    EKGs/Labs/Other Studies Reviewed:    The following studies were reviewed today: See above re: review of cardiac workup in [place]  EKG:  EKG is personally reviewed.  The ekg ordered today demonstrates NSR, borderline low voltage  precordial leads  Recent Labs: 01/19/2018: Hemoglobin 13.1; Platelets 336.0 05/31/2018: ALT 27; BUN 19; Creatinine, Ser 0.89; Potassium 4.3; Sodium 138; TSH 2.74  Recent Lipid Panel    Component Value Date/Time   CHOL 141 05/13/2017 0950   TRIG 169.0 (H) 05/13/2017 0950   HDL 46.30 05/13/2017 0950   CHOLHDL 3 05/13/2017 0950   VLDL 33.8 05/13/2017 0950   LDLCALC 61 05/13/2017 0950    Physical Exam:    VS:  BP 118/76 (BP Location: Right Arm, Patient Position: Sitting, Cuff Size: Normal)   Pulse 92   Ht 5\' 2"  (1.575 m)   Wt 186 lb 12 oz (84.7 kg)   BMI 34.16 kg/m     Wt Readings  from Last 3 Encounters:  09/14/18 186 lb 12 oz (84.7 kg)  09/12/18 183 lb (83 kg)  05/31/18 183 lb 3.2 oz (83.1 kg)     GEN: Well nourished, well developed in no acute distress HEENT: Normal NECK: No JVD; No carotid bruits LYMPHATICS: No lymphadenopathy CARDIAC: regular rhythm, normal S1 and S2, no murmurs, rubs, gallops. Radial and DP pulses 2+ bilaterally. RESPIRATORY:  Clear to auscultation without rales, wheezing or rhonchi  ABDOMEN: Soft, non-tender, non-distended MUSCULOSKELETAL:  No edema; No deformity  SKIN: Warm and dry NEUROLOGIC:  Alert and oriented x 3 PSYCHIATRIC:  Normal affect   ASSESSMENT:    1. Coronary artery disease involving native heart without angina pectoris, unspecified vessel or lesion type   2. Atypical chest pain   3. Essential hypertension   4. Hyperlipidemia, unspecified hyperlipidemia type   5. Cardiac risk counseling   6. Counseling on health promotion and disease prevention    PLAN:    Nonobstructive CAD: had very thorough workup in [place], reviewed above -on aspirin and statin  Atypical chest pain: now resolved, but nature of pain not typical for cardiac etiology -she will monitor and let us know if it changes  Shortness of breath, intermittent: with bending over and sometimes with speaking, but no heart failure features either by history or exam.  Normal echo 2016. Suggests noncardiac etiology. -continue to monitor  Hypertension: BP at goal today -continue carvedilol 3.125 mg BID, losartan 25 mg daily  Dyslipidemia: LDL 61, continue atorvastatin 40 mg  Cardiac risk counseling and prevention recommendations: -recommend heart healthy/Mediterranean diet, with whole grains, fruits, vegetable, fish, lean meats, nuts, and olive oil. Limit salt. -recommend moderate walking, 3-5 times/week for 30-50 minutes each session. Aim for at least 150 minutes.week. Goal should be pace of 3 miles/hours, or walking 1.5 miles in 30 minutes -recommend avoidance of tobacco products. Avoid excess alcohol. -ASCVD risk score: The 10-year ASCVD risk score Mikey Bussing DC Brooke Bonito., et al., 2013) is: 9%   Values used to calculate the score:     Age: 23 years     Sex: Female     Is Non-Hispanic African American: No     Diabetic: No     Tobacco smoker: No     Systolic Blood Pressure: 062 mmHg     Is BP treated: Yes     HDL Cholesterol: 46.3 mg/dL     Total Cholesterol: 141 mg/dL    Plan for follow up: on good medical therapy. Can follow up as needed if symptoms change  TIME SPENT WITH PATIENT: 50 minutes of direct patient care. More than 50% of that time was spent on coordination of care and counseling regarding review of records with her, symptom assessment, counseling.  Buford Dresser, MD, PhD [place]  [facility]   Medication Adjustments/Labs and Tests Ordered: Current medicines are reviewed at length with the patient today.  Concerns regarding medicines are outlined above.  Orders Placed This Encounter  Procedures  . EKG 12-Lead   No orders of the defined types were placed in this encounter.   Patient Instructions  Medication Instructions:  Your physician recommends that you continue on your current medications as directed. Please refer to the Current Medication list given to you today.  If you need a refill on your cardiac medications  before your next appointment, please call your pharmacy.   Lab work: NONE  If you have labs (blood work) drawn today and your tests are completely normal, you will receive your  results only by: Marland Kitchen MyChart Message (if you have MyChart) OR . A paper copy in the mail If you have any lab test that is abnormal or we need to change your treatment, we will call you to review the results.  Testing/Procedures: NONE  Follow-Up: At [facility], you and your health needs are our priority.  As part of our continuing mission to provide you with exceptional heart care, we have created designated Provider Care Teams.  These Care Teams include your primary Cardiologist (physician) and Advanced Practice Providers (APPs -  Physician Assistants and Nurse Practitioners) who all work together to provide you with the care you need, when you need it. You will need a follow up appointment in [place], Buford Dresser, MD PhD 09/14/2018 6:37 PM    [place]

## 2018-09-16 ENCOUNTER — Ambulatory Visit (INDEPENDENT_AMBULATORY_CARE_PROVIDER_SITE_OTHER): Payer: Medicare Other | Admitting: Internal Medicine

## 2018-09-16 ENCOUNTER — Other Ambulatory Visit: Payer: Self-pay

## 2018-09-16 ENCOUNTER — Telehealth: Payer: Self-pay | Admitting: Family Medicine

## 2018-09-16 VITALS — BP 124/88 | HR 90 | Temp 98.4°F | Ht 62.0 in | Wt 186.6 lb

## 2018-09-16 DIAGNOSIS — E038 Other specified hypothyroidism: Secondary | ICD-10-CM | POA: Insufficient documentation

## 2018-09-16 DIAGNOSIS — R748 Abnormal levels of other serum enzymes: Secondary | ICD-10-CM

## 2018-09-16 DIAGNOSIS — R7989 Other specified abnormal findings of blood chemistry: Secondary | ICD-10-CM | POA: Diagnosis not present

## 2018-09-16 DIAGNOSIS — E039 Hypothyroidism, unspecified: Secondary | ICD-10-CM | POA: Diagnosis not present

## 2018-09-16 LAB — COMPREHENSIVE METABOLIC PANEL
ALT: 32 U/L (ref 0–35)
AST: 23 U/L (ref 0–37)
Albumin: 4.5 g/dL (ref 3.5–5.2)
Alkaline Phosphatase: 126 U/L — ABNORMAL HIGH (ref 39–117)
BUN: 17 mg/dL (ref 6–23)
CO2: 30 mEq/L (ref 19–32)
Calcium: 9.6 mg/dL (ref 8.4–10.5)
Chloride: 101 mEq/L (ref 96–112)
Creatinine, Ser: 0.91 mg/dL (ref 0.40–1.20)
GFR: 61.28 mL/min (ref >60.00)
Glucose, Bld: 114 mg/dL — ABNORMAL HIGH (ref 70–99)
Potassium: 4.1 mEq/L (ref 3.5–5.1)
Sodium: 139 mEq/L (ref 135–145)
Total Bilirubin: 0.6 mg/dL (ref 0.2–1.2)
Total Protein: 8 g/dL (ref 6.0–8.3)

## 2018-09-16 LAB — TSH: TSH: 2.27 u[IU]/mL (ref 0.35–4.50)

## 2018-09-16 LAB — T4, FREE: Free T4: 0.72 ng/dL (ref 0.60–1.60)

## 2018-09-16 NOTE — Patient Instructions (Signed)
-   Start Calcium - Vitamin D (600-400 ) Twice daily

## 2018-09-16 NOTE — Telephone Encounter (Signed)
Please let the patient know that our clinical pharmacist looked at her medications and identified several medications that have a fairly low incidence of tingling though the pharmacist also agreed that if her symptoms responded to clonazepam then it was likely psychologically related.  I would suggest she continue with her current treatment regimen and monitor.

## 2018-09-16 NOTE — Telephone Encounter (Signed)
-----   Message from De Hollingshead, [facility] sent at 09/12/2018  5:09 PM EDT ----- Amlodipine: <1% incidence paresthesias  Atorvastatin: <1% incidence paresthesia  Carvedilol: 1-<3% incidence paresthesia  Duloxetine: >1% incidence paresthesia Lamotrigine: 2-5% incidence paresthesia  Quetiapine: 2-3% incidence paresthesia  However, looks like this was going on before the lamotrigine was started, and 04/26/2018 neuro note says that patient didn't note any benefit when she held quetiapine.   Could always do trials off of the other ones above just to see if it relates? But if clonazepam improves the symptoms, then I agree, most likely psychologically related.   Catie ----- Message ----- From: Leone Haven, MD Sent: 09/12/2018   2:13 PM EDT To: De Hollingshead, [facility]  Hey Catie,   could you review this patients medications and see if any of them could be contributing to an odd tingling sensation that the patient notices occurring in her head and scalp? She has had this for a number of months now and has seen neurology, dermatology, endocrinology, and rheumatology.  Thanks.Randall Hiss

## 2018-09-16 NOTE — Progress Notes (Addendum)
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 female with a past medical history of Dyslipidemia, HTN, GAD, PTSD, GERD, fibromyalgia and OA . The patient presented for initial endocrinology clinic visit on 09/19/2018 for consultative assistance with her Cushing phenomenon .    Right knee intra-articular injection last Thursday otherwise she has not received any other forms of glucocorticoids.    Had a DXA scan years ago which was normal.  She is not on biotin.   HISTORICAL SUMMARY: The patientpresented to Rheumatology  in March, 2020 for evaluation of autoimmune disease, and had endorsed weight gain of 30-35 lbs over the past year and was noted to have centripetal fat distribution and a buffalo hump, she was sent her for further evaluation of cushing syndrome.  She was started on Lamictal and Zoloft sometime in March, 2020  Dexamethasone suppression test was negative 06/2018  SUBJECTIVE:   During last visit (05/31/2018): Dexamethasone suppression test negative 06/2018. TSH and T3 normal but had low T4. As well as elevated Alk Phos.    Today (09/16/18):  Nicole Washington is here for a follow up on abnormal TFT's and elevated Alk. Phos. Her weight has been stable over the past few months, but is starting to work on it.  Her energy level is fair, she does not feel more tired then usual. Has fibromyalgia hence has chronic pain syndrome as well as DJD .   She has GERD and takes pepcid  Daily, she knows her triggering foods.     Marland Kitchen  HISTORY:  Past Medical History:  Past Medical History:  Diagnosis Date  . Burning mouth syndrome   . CAD (coronary artery disease)   . Cancer (HCC)    basal and squamous cell  . Depression   . GERD (gastroesophageal reflux disease)   .  Hyperlipidemia   . Hypertension   . Thyroid disease    Past Surgical History:  Past Surgical History:  Procedure Laterality Date  . BREAST BIOPSY Right    Neg  . CESAREAN SECTION        Social History:  reports that she quit smoking about 28 years ago. She has never used smokeless tobacco. She reports current alcohol use. She reports that she does not use drugs.  Family History: family history includes COPD in her father; Cancer in her brother; Cerebral aneurysm (age of onset: 91) in her mother; Heart Problems in her paternal grandfather; Lupus in an other family member.   HOME MEDICATIONS: Allergies as of 09/16/2018      Reactions   Codeine Itching   Tylenol [acetaminophen] Itching      Medication List       Accurate as of September 16, 2018 11:59 PM. If you have any questions, ask your nurse or doctor.        amLODipine 5 MG tablet Commonly known as: NORVASC Take 1 tablet (5 mg total) by mouth daily.   aspirin EC 81 MG tablet Take 81 mg by mouth daily.   atorvastatin 40 MG tablet Commonly known as: LIPITOR Take 40 mg by mouth daily.   carvedilol 3.125 MG tablet Commonly known as: COREG Take 1 tablet (3.125 mg total) by mouth 2 (two) times daily with  a meal.   clonazePAM 0.5 MG tablet Commonly known as: KLONOPIN TAKE 2 TABLETS (1 MG TOTAL) BY MOUTH DAILY AS NEEDED FOR ANXIETY.   DULoxetine 60 MG capsule Commonly known as: CYMBALTA Take 60 mg by mouth 2 (two) times daily.   gabapentin 300 MG capsule Commonly known as: NEURONTIN Take 300 mg by mouth daily.   lamoTRIgine 25 MG tablet Commonly known as: LAMICTAL Take 25 mg by mouth daily.   losartan 25 MG tablet Commonly known as: COZAAR Take 1 tablet (25 mg total) by mouth daily.   QUEtiapine 25 MG tablet Commonly known as: SEROQUEL Take 50 mg by mouth at bedtime.   sertraline 50 MG tablet Commonly known as: ZOLOFT Take 50 mg by mouth daily.         REVIEW OF SYSTEMS: A comprehensive ROS was  conducted with the patient and is negative except as per HPI and below:  Review of Systems  Constitutional: Negative for fever, malaise/fatigue and weight loss.  HENT: Negative for congestion and sore throat.   Eyes: Negative for blurred vision and pain.  Respiratory: Negative for cough and shortness of breath.   Cardiovascular: Negative for chest pain and palpitations.  Gastrointestinal: Positive for heartburn. Negative for diarrhea and nausea.  Genitourinary: Negative for frequency.  Musculoskeletal: Positive for back pain and joint pain.       Right knee   Skin: Negative.   Neurological: Positive for sensory change. Negative for weakness.  Endo/Heme/Allergies: Negative for polydipsia.  Psychiatric/Behavioral: Negative for depression. The patient is not nervous/anxious.        OBJECTIVE:  VS: BP 124/88 (BP Location: Left Arm, Patient Position: Sitting, Cuff Size: Normal)   Pulse 90   Temp 98.4 F (36.9 C)   Ht 5' 2" (1.575 m)   Wt 186 lb 9.6 oz (84.6 kg)   SpO2 94%   BMI 34.13 kg/m    Wt Readings from Last 3 Encounters:  09/16/18 186 lb 9.6 oz (84.6 kg)  09/14/18 186 lb 12 oz (84.7 kg)  09/12/18 183 lb (83 kg)     EXAM: General: Pt appears well and is in NAD  Neck: General: Supple without adenopathy. Thyroid: Thyroid size normal.  No goiter or nodules appreciated. No thyroid bruit.  Lungs: Clear with good BS bilat with no rales, rhonchi, or wheezes  Heart: Auscultation: RRR.  Abdomen: Normoactive bowel sounds, soft, nontender, without masses or organomegaly palpable  Extremities: BL LE: No pretibial edema normal ROM and strength.  Skin: Hair: Texture and amount normal with gender appropriate distribution Skin Inspection: No rashes. Skin Palpation: Skin temperature, texture, and thickness normal to palpation  Mental Status: Judgment, insight: Intact Orientation: Oriented to time, place, and person Mood and affect: No depression, anxiety, or agitation      DATA  REVIEWED: 05/11/2018  Cortisol 11.9 ug/dL  TSH 2.910 uIU/mL  T4 5.6 ug/dL  T3 uptake 16 ( 24-39) % FT index 0.9 (1.2-4.9 ) Gluc 115 mg/dL  GFR 72 Na 140 mmol/L K 4.2 mmol/L  Ca 9.7 mg/dL (corrected 9.3) Alk Phos 148 (39-117) AST 22 IU/L ALT 28 IU/L   H/H 13.2 / 38.6      Results for Nicole Washington, Nicole Washington (MRN [id number]) as of 09/19/2018 10:49  Ref. Range 09/16/2018 11:27  Sodium Latest Ref Range: 135 - 145 mEq/L 139  Potassium Latest Ref Range: 3.5 - 5.1 mEq/L 4.1  Chloride Latest Ref Range: 96 - 112 mEq/L 101  CO2 Latest Ref Range: 19 - 32 mEq/L 30  Glucose Latest Ref Range: 70 - 99 mg/dL 114 (H)  BUN Latest Ref Range: 6 - 23 mg/dL 17  Creatinine Latest Ref Range: 0.40 - 1.20 mg/dL 0.91  Calcium Latest Ref Range: 8.4 - 10.5 mg/dL 9.6  Alkaline Phosphatase Latest Ref Range: 39 - 117 U/L 126 (H)  Albumin Latest Ref Range: 3.5 - 5.2 g/dL 4.5  AST Latest Ref Range: 0 - 37 U/L 23  ALT Latest Ref Range: 0 - 35 U/L 32  Total Protein Latest Ref Range: 6.0 - 8.3 g/dL 8.0  Total Bilirubin Latest Ref Range: 0.2 - 1.2 mg/dL 0.6  GFR Latest Ref Range: >60.00 mL/min 61.28  TSH Latest Ref Range: 0.35 - 4.50 uIU/mL 2.27  Triiodothyronine (T3) Latest Ref Range: 76 - 181 ng/dL 133  T4,Free(Direct) Latest Ref Range: 0.60 - 1.60 ng/dL 0.72   Results for Nicole Washington, Nicole Washington (MRN [id number]) as of 09/21/2018 10:56  Ref. Range 09/16/2018 11:27  Bone Isoenzymes Latest Ref Range: 28 - 66 % 24 (L)  Intestinal Isoenzymes Latest Ref Range: 1 - 24 % 2  Liver Isoenzymes Latest Ref Range: 25 - 69 % 74 (H)   ASSESSMENT/PLAN/RECOMMENDATIONS:    1. Subclinical Hypothyroidism :  - Clinically and biochemically  euthyroid   2. Elevated Alkaline Phosphatase:   - Initially this was thought to be of bone origin given normal GGT , but the  Alkaline Phos. Isozymes results show elevation of the hepatic content. This is beyond the scope of endocrinology and will defer further work up to the discretion of her PCP.  - We will  proceed with DXA given age appropriateness.  - I was unable to check Vitamin D , as her insurance won;t cover with current diagnoses, will consider checking it if her DXA is abnormal.  - We discussed briefly bisphosphonate for osteoporosis we also discussed IV route if GERD remains an issue.    Medications  Calcium Carbonate- Vitamin D 3 600-400 mg BID     F/u 6 months    Signed electronically by: Mack Guise, MD  [facility]  [facility] [address] [place], [place] [address] Phone: [phone] FAX: [phone]   CC: Leone Haven, MD [address] [place] [address] Phone: [phone] Fax: [phone]   Return to Endocrinology clinic as below: Future Appointments  Date Time Provider [place]  09/22/2018 10:30 AM [facility] DEXA 1 [facility]  01/16/2019  1:45 PM Leone Haven, MD [facility]  03/20/2019 11:10 AM Shamleffer, Melanie Crazier, MD [facility] None

## 2018-09-19 ENCOUNTER — Encounter: Payer: Self-pay | Admitting: Internal Medicine

## 2018-09-19 NOTE — Progress Notes (Signed)
na

## 2018-09-19 NOTE — Telephone Encounter (Signed)
I called and informed the patient of the information given and she understood and stated she is taking the clonazapam and will see how it does.  Clydell Sposito,cma

## 2018-09-20 LAB — ALKALINE PHOSPHATASE ISOENZYMES
Alkaline phosphatase (APISO): 141 U/L (ref 37–153)
Bone Isoenzymes: 24 % — ABNORMAL LOW (ref 28–66)
Intestinal Isoenzymes: 2 % (ref 1–24)
Liver Isoenzymes: 74 % — ABNORMAL HIGH (ref 25–69)

## 2018-09-20 LAB — T3: T3, Total: 133 ng/dL (ref 76–181)

## 2018-09-21 ENCOUNTER — Telehealth: Payer: Self-pay | Admitting: Internal Medicine

## 2018-09-21 NOTE — Telephone Encounter (Signed)
Discussed lab results with pt with normalization of TFT's and elevated Alk Phos- Hepatic origin.   Has DXA appointment tomorrow     Abby Nena Jordan, MD  [facility]  [facility] [address] [place], [place] [address] Phone: [phone] FAX: [phone]

## 2018-09-21 NOTE — Telephone Encounter (Signed)
Left a message for a call back   Abby Jaralla Shamleffer, MD  [facility]   [facility] [address] [place], [place] [address] Phone: [phone] FAX: [phone]  

## 2018-09-22 ENCOUNTER — Encounter: Payer: Self-pay | Admitting: Internal Medicine

## 2018-09-22 ENCOUNTER — Ambulatory Visit (INDEPENDENT_AMBULATORY_CARE_PROVIDER_SITE_OTHER)
Admission: RE | Admit: 2018-09-22 | Discharge: 2018-09-22 | Disposition: A | Discharge status: Home / Self Care | Payer: Medicare Other | Source: Ambulatory Visit | Attending: Internal Medicine | Admitting: Internal Medicine

## 2018-09-22 ENCOUNTER — Other Ambulatory Visit: Payer: Self-pay

## 2018-09-22 DIAGNOSIS — E2839 Other primary ovarian failure: Secondary | ICD-10-CM

## 2018-09-24 ENCOUNTER — Telehealth: Payer: Self-pay | Admitting: Family Medicine

## 2018-09-24 DIAGNOSIS — R748 Abnormal levels of other serum enzymes: Secondary | ICD-10-CM

## 2018-09-24 NOTE — Telephone Encounter (Signed)
Please let the patient know that I received a note from her endocrinologist.  It appears that her alkaline phosphatase is actually elevated relating to her liver.  Her endocrinologist recommended follow-up with me.  I would recommend that the patient see a GI doctor to determine if any further evaluation is needed.  If she is okay I can place a referral.  Thanks.

## 2018-09-27 DIAGNOSIS — F5105 Insomnia due to other mental disorder: Secondary | ICD-10-CM | POA: Diagnosis not present

## 2018-09-27 DIAGNOSIS — F411 Generalized anxiety disorder: Secondary | ICD-10-CM | POA: Diagnosis not present

## 2018-09-27 DIAGNOSIS — F39 Unspecified mood [affective] disorder: Secondary | ICD-10-CM | POA: Diagnosis not present

## 2018-09-27 DIAGNOSIS — F4312 Post-traumatic stress disorder, chronic: Secondary | ICD-10-CM | POA: Diagnosis not present

## 2018-09-27 NOTE — Addendum Note (Signed)
Addended by: Caryl Bis Calyssa Zobrist G on: 09/27/2018 12:46 PM   Modules accepted: Orders

## 2018-09-27 NOTE — Telephone Encounter (Signed)
Referral placed.

## 2018-09-27 NOTE — Telephone Encounter (Signed)
Called and spoke with the patient and informed her of her lab results from endocrinologist. The patient agrees that she should see a GI doctor and states she would like for it to be here in the [place] area.  Crystalina Stodghill,cma

## 2018-10-14 ENCOUNTER — Ambulatory Visit (INDEPENDENT_AMBULATORY_CARE_PROVIDER_SITE_OTHER): Payer: Medicare Other

## 2018-10-14 ENCOUNTER — Other Ambulatory Visit: Payer: Self-pay

## 2018-10-14 DIAGNOSIS — Z0000 Encounter for general adult medical examination without abnormal findings: Secondary | ICD-10-CM

## 2018-10-14 NOTE — Patient Instructions (Addendum)
  Nicole Washington , Thank you for taking time to come for your Medicare Wellness Visit. I appreciate your ongoing commitment to your health goals. Please review the following plan we discussed and let me know if I can assist you in the future.   These are the goals we discussed: Goals      Patient Stated   . Increase physical activity (pt-stated)     Walk for exercise       This is a list of the screening recommended for you and due dates:  Health Maintenance  Topic Date Due  . Tetanus Vaccine  08/16/1968  . Flu Shot  05/17/2019*  . Pneumonia vaccines (2 of 2 - PPSV23) 11/04/2018  . Mammogram  05/21/2019  . Colon Cancer Screening  10/06/2023  . DEXA scan (bone density measurement)  Completed  .  Hepatitis C: One time screening is recommended by Center for Disease Control  (CDC) for  adults born from [birth year] through 1966.   Completed  *Topic was postponed. The date shown is not the original due date.

## 2018-10-14 NOTE — Progress Notes (Signed)
Subjective:   Nicole Washington is a 68 y.o. female who presents for an Initial Medicare Annual Wellness Visit.  Review of Systems    No ROS.  Medicare Wellness Virtual Visit.  Visual/audio telehealth visit, UTA vital signs.   See social history for additional risk factors.    Cardiac Risk Factors include: advanced age (>16men, >62 women)     Objective:    Today's Vitals   There is no height or weight on file to calculate BMI.  Advanced Directives 10/14/2018  Does Patient Have a Medical Advance Directive? No  Would patient like information on creating a medical advance directive? No - Patient declined    Current Medications (verified) Outpatient Encounter Medications as of 10/14/2018  Medication Sig  . amLODipine (NORVASC) 5 MG tablet Take 1 tablet (5 mg total) by mouth daily.  Marland Kitchen aspirin EC 81 MG tablet Take 81 mg by mouth daily.  Marland Kitchen atorvastatin (LIPITOR) 40 MG tablet Take 40 mg by mouth daily.  . carvedilol (COREG) 3.125 MG tablet Take 1 tablet (3.125 mg total) by mouth 2 (two) times daily with a meal.  . clonazePAM (KLONOPIN) 0.5 MG tablet TAKE 2 TABLETS (1 MG TOTAL) BY MOUTH DAILY AS NEEDED FOR ANXIETY.  . DULoxetine (CYMBALTA) 60 MG capsule Take 60 mg by mouth 2 (two) times daily.  Marland Kitchen gabapentin (NEURONTIN) 300 MG capsule Take 300 mg by mouth daily.   Marland Kitchen lamoTRIgine (LAMICTAL) 25 MG tablet Take 25 mg by mouth daily.  Marland Kitchen losartan (COZAAR) 25 MG tablet Take 1 tablet (25 mg total) by mouth daily.  . QUEtiapine (SEROQUEL) 25 MG tablet Take 50 mg by mouth at bedtime.   . sertraline (ZOLOFT) 50 MG tablet Take 50 mg by mouth daily.   No facility-administered encounter medications on file as of 10/14/2018.     Allergies (verified) Codeine and Tylenol [acetaminophen]   History: Past Medical History:  Diagnosis Date  . Burning mouth syndrome   . CAD (coronary artery disease)   . Cancer (HCC)    basal and squamous cell  . Depression   . GERD (gastroesophageal reflux disease)    . Hyperlipidemia   . Hypertension   . Thyroid disease    Past Surgical History:  Procedure Laterality Date  . BREAST BIOPSY Right    Neg  . CESAREAN SECTION     Family History  Problem Relation Age of Onset  . Cerebral aneurysm Mother 58  . COPD Father   . Cancer Brother        ? colon  . Lupus Other   . Heart Problems Paternal Grandfather   . Breast cancer Neg Hx    Social History   Socioeconomic History  . Marital status: Married    Spouse name: Not on file  . Number of children: Not on file  . Years of education: Not on file  . Highest education level: Not on file  Occupational History  . Not on file  Social Needs  . Financial resource strain: Not hard at all  . Food insecurity    Worry: Never true    Inability: Never true  . Transportation needs    Medical: No    Non-medical: No  Tobacco Use  . Smoking status: Former Smoker    Quit date: 12/20/1989    Years since quitting: 28.8  . Smokeless tobacco: Never Used  Substance and Sexual Activity  . Alcohol use: Yes    Comment: 4 times a year  . Drug use:  No  . Sexual activity: Not on file  Lifestyle  . Physical activity    Days per week: 0 days    Minutes per session: Not on file  . Stress: Not at all  Relationships  . Social Herbalist on phone: Not on file    Gets together: Not on file    Attends religious service: Not on file    Active member of club or organization: Not on file    Attends meetings of clubs or organizations: Not on file    Relationship status: Not on file  Other Topics Concern  . Not on file  Social History Narrative  . Not on file    Tobacco Counseling Counseling given: Not Answered   Clinical Intake:  Pre-visit preparation completed: Yes        Diabetes: No  How often do you need to have someone help you when you read instructions, pamphlets, or other written materials from your doctor or pharmacy?: 1 - Never  Interpreter Needed?: No      Activities  of Daily Living In your present state of health, do you have any difficulty performing the following activities: 10/14/2018  Hearing? N  Vision? N  Difficulty concentrating or making decisions? N  Walking or climbing stairs? N  Dressing or bathing? N  Doing errands, shopping? N  Preparing Food and eating ? N  Using the Toilet? N  In the past six months, have you accidently leaked urine? N  Do you have problems with loss of bowel control? N  Managing your Medications? N  Managing your Finances? N  Housekeeping or managing your Housekeeping? N  Some recent data might be hidden     Immunizations and Health Maintenance Immunization History  Administered Date(s) Administered  . Influenza, High Dose Seasonal PF 02/02/2017  . Influenza,inj,Quad PF,6+ Mos 11/03/2017  . Pneumococcal Conjugate-13 11/03/2017   Health Maintenance Due  Topic Date Due  . Samul Dada  08/16/1969    Patient Care Team: Leone Haven, MD as PCP - General (Family Medicine)  Indicate any recent Medical Services you may have received from other than [facility] providers in the past year (date may be approximate).     Assessment:   This is a routine wellness examination for Nicole Washington.  I connected with patient 10/14/18 at 11:00 AM EDT by a video/audio enabled telemedicine application and verified that I am speaking with the correct person using two identifiers. Patient stated full name and DOB. Patient gave permission to continue with virtual visit. Patient's location was at home and Nurse's location was at [place] office.   Health Maintenance Due: Influenza vaccine 2020- discussed; to be completed in season with doctor or local pharmacy.   Tdap- discussed; to be completed with doctor in visit or local pharmacy. Update all pending maintenance due as appropriate.   See completed HM at the end of note.   Eye: Visual acuity not assessed. Virtual visit. Wears corrective lenses. Followed by their ophthalmologist  every 12 months.   Dental: Visits every 6 months.    Hearing: Demonstrates normal hearing during visit.  Safety:  Patient feels safe at home- yes Patient does have smoke detectors at home- yes Patient does wear sunscreen or protective clothing when in direct sunlight - yes Patient does wear seat belt when in a moving vehicle - yes Adequate lighting in walkways free from debris- yes Grab bars and handrails used as appropriate- yes Ambulates with no assistive device Cell phone on  person when ambulating outside of the home- yes  Social: Alcohol intake - yes      Smoking history- former    Smokers in home? none Illicit drug use? none  Depression: PHQ 2 &9 complete. Stable. Followed by psychiatry.   Falls: See screening below.    Medication: Taking as directed and without issues.   Covid-19: Precautions and sickness symptoms discussed. Wears mask, social distancing, hand hygiene as appropriate.   Activities of Daily Living Patient denies needing assistance with: household chores, feeding themselves, getting from bed to chair, getting to the toilet, bathing/showering, dressing, managing money, or preparing meals.   Memory: Patient is alert. Patient denies difficulty focusing or concentrating. Correctly identified the president of the [place], season and recall. Patient likes to read and play memory puzzles on her phone for brain stimulation.  BMI- discussed the importance of a healthy diet, water intake and the benefits of aerobic exercise.  Educational material provided.  Physical activity- none. Encouraged to stay active.  Diet: low carb Water: good intake Caffeine: 1 cup coffee  Advanced Directive: End of life planning; Advanced aging; Advanced directives discussed.  No HCPOA/Living Will.  Additional information declined at this time.  Other Providers Patient Care Team: Leone Haven, MD as PCP - General (Family Medicine) Hearing/Vision screen  Hearing  Screening   125Hz  250Hz  500Hz  1000Hz  2000Hz  3000Hz  4000Hz  6000Hz  8000Hz   Right ear:           Left ear:           Comments: Patient is able to hear conversational tones without difficulty.  No issues reported.   Vision Screening Comments: Wears corrective lenses Visual acuity not assessed, virtual visit.  They have seen their ophthalmologist in the last 12 months.     Dietary issues and exercise activities discussed: Current Exercise Habits: The patient does not participate in regular exercise at present  Goals      Patient Stated   . Increase physical activity (pt-stated)     Walk for exercise      Depression Screen PHQ 2/9 Scores 10/14/2018 03/11/2018 01/19/2018  PHQ - 2 Score - 1 0  Exception Documentation (No Data) - -    Fall Risk Fall Risk  10/14/2018 04/26/2018 03/11/2018 01/19/2018 12/20/2017  Falls in the past year? 0 0 0 0 0  Number falls in past yr: - 0 0 0 0  Injury with Fall? - 0 0 0 0  Follow up - Falls evaluation completed - - Falls evaluation completed   Timed Get Up and Go Performed no, virtual visit  Cognitive Function:     6CIT Screen 10/14/2018  What Year? 0 points  What month? 0 points  What time? 0 points  Count back from 20 0 points  Months in reverse 0 points  Repeat phrase 0 points  Total Score 0    Screening Tests Health Maintenance  Topic Date Due  . TETANUS/TDAP  08/16/1969  . INFLUENZA VACCINE  05/17/2019 (Originally 09/17/2018)  . PNA vac Low Risk Adult (2 of 2 - PPSV23) 11/04/2018  . MAMMOGRAM  05/21/2019  . COLONOSCOPY  10/06/2023  . DEXA SCAN  Completed  . Hepatitis C Screening  Completed     Plan:   Keep all routine maintenance appointments.   Follow up 4 month 01/16/19 @ 1:45  Medicare Attestation I have personally reviewed: The patient's medical and social history Their use of alcohol, tobacco or illicit drugs Their current medications and supplements The patient's functional  ability including ADLs,fall risks, home  safety risks, cognitive, and hearing and visual impairment Diet and physical activities Evidence for depression   In addition, I have reviewed and discussed with patient certain preventive protocols, quality metrics, and best practice recommendations. A written personalized care plan for preventive services as well as general preventive health recommendations were provided to patient.     Varney Biles, LPN   [phone]

## 2018-10-17 NOTE — Progress Notes (Signed)
Reviewed and agree with note  Philis Nettle FNP

## 2018-10-19 ENCOUNTER — Other Ambulatory Visit: Payer: Self-pay | Admitting: Family Medicine

## 2018-10-26 ENCOUNTER — Other Ambulatory Visit: Payer: Self-pay | Admitting: Family Medicine

## 2018-10-26 DIAGNOSIS — H2513 Age-related nuclear cataract, bilateral: Secondary | ICD-10-CM | POA: Diagnosis not present

## 2018-11-15 ENCOUNTER — Ambulatory Visit (INDEPENDENT_AMBULATORY_CARE_PROVIDER_SITE_OTHER): Payer: Medicare Other | Admitting: Gastroenterology

## 2018-11-15 ENCOUNTER — Other Ambulatory Visit: Payer: Self-pay

## 2018-11-15 ENCOUNTER — Encounter (INDEPENDENT_AMBULATORY_CARE_PROVIDER_SITE_OTHER): Payer: Self-pay

## 2018-11-15 ENCOUNTER — Encounter: Payer: Self-pay | Admitting: Gastroenterology

## 2018-11-15 VITALS — BP 118/77 | HR 88 | Temp 98.2°F | Ht 62.0 in | Wt 180.6 lb

## 2018-11-15 DIAGNOSIS — R748 Abnormal levels of other serum enzymes: Secondary | ICD-10-CM | POA: Diagnosis not present

## 2018-11-15 DIAGNOSIS — I2510 Atherosclerotic heart disease of native coronary artery without angina pectoris: Secondary | ICD-10-CM

## 2018-11-15 NOTE — Progress Notes (Signed)
Jonathon Bellows MD, MRCP(U.K) [address]  [place]  [place], [place] [address]  Main: [phone]  Fax: [phone]   Gastroenterology Consultation  Referring Provider:     Leone Haven, MD Primary Care Physician:  Leone Haven, MD Primary Gastroenterologist:  Dr. Jonathon Bellows  Reason for Consultation:     Elevation in alkaline phosphatase        HPI:   Nicole Washington is a 68 y.o. y/o female referred for consultation & management  by Dr. Caryl Bis, Angela Adam, MD.    First noticed 5 months back with an alkaline phosphatase of 133 and when repeated 2 months back is 126.  Fractionated shows that a large component from the liver and the smaller component from the bone. TSH normal.  Transaminases normal.  Previously was completely normal  New medications started recently on Lamictal and Zoloft.  No family history of liver disease.  No prior tattoos, incarceration, blood transfusion, herbal supplements, illegal drug use, alcohol consumption.  Denies any pruritus.   Hepatic Function Latest Ref Rng & Units 09/16/2018 05/31/2018 02/21/2018  Total Protein 6.0 - 8.3 g/dL 8.0 8.0 7.3  Albumin 3.5 - 5.2 g/dL 4.5 4.5 4.3  AST 0 - 37 U/L _0 ALT 0 - 35 U/L 32 27 35  Alk Phosphatase 39 - 117 U/L 126(H) 133(H) 116  Total Bilirubin 0.2 - 1.2 mg/dL 0.6 0.5 0.6  Bilirubin, Direct 0.0 - 0.3 mg/dL - - 0.1    Past Medical History:  Diagnosis Date  . Burning mouth syndrome   . CAD (coronary artery disease)   . Cancer (HCC)    basal and squamous cell  . Depression   . GERD (gastroesophageal reflux disease)   . Hyperlipidemia   . Hypertension   . Thyroid disease     Past Surgical History:  Procedure Laterality Date  . BREAST BIOPSY Right    Neg  . CESAREAN SECTION      Prior to Admission medications   Medication Sig Start Date End Date Taking? Authorizing Provider  amLODipine (NORVASC) 5 MG tablet TAKE 1 TABLET BY MOUTH EVERY DAY 10/26/18   Leone Haven, MD   aspirin EC 81 MG tablet Take 81 mg by mouth daily.    [provider]  atorvastatin (LIPITOR) 40 MG tablet Take 40 mg by mouth daily.    [provider]  carvedilol (COREG) 3.125 MG tablet TAKE 1 TABLET (3.125 MG TOTAL) BY MOUTH 2 (TWO) TIMES DAILY WITH A MEAL. 10/19/18   Leone Haven, MD  clonazePAM (KLONOPIN) 0.5 MG tablet TAKE 2 TABLETS (1 MG TOTAL) BY MOUTH DAILY AS NEEDED FOR ANXIETY. 10/20/18   Leone Haven, MD  DULoxetine (CYMBALTA) 60 MG capsule Take 60 mg by mouth 2 (two) times daily.    [provider]  gabapentin (NEURONTIN) 300 MG capsule Take 300 mg by mouth daily.  01/26/18 03/08/19  [provider]  lamoTRIgine (LAMICTAL) 25 MG tablet Take 25 mg by mouth daily. 04/19/18   [provider]  losartan (COZAAR) 25 MG tablet TAKE 1 TABLET BY MOUTH EVERY DAY 10/19/18   Leone Haven, MD  QUEtiapine (SEROQUEL) 25 MG tablet Take 50 mg by mouth at bedtime.     [provider]  sertraline (ZOLOFT) 50 MG tablet Take 50 mg by mouth daily. 04/20/18   [provider]    Family History  Problem Relation Age of Onset  . Cerebral aneurysm Mother 57  .  COPD Father   . Cancer Brother        ? colon  . Lupus Other   . Heart Problems Paternal Grandfather   . Breast cancer Neg Hx      Social History   Tobacco Use  . Smoking status: Former Smoker    Quit date: 12/20/1989    Years since quitting: 28.9  . Smokeless tobacco: Never Used  Substance Use Topics  . Alcohol use: Yes    Comment: 4 times a year  . Drug use: No    Allergies as of 11/15/2018 - Review Complete 11/15/2018  Allergen Reaction Noted  . Codeine Itching 10/27/2016  . Tylenol [acetaminophen] Itching 10/27/2016    Review of Systems:    All systems reviewed and negative except where noted in HPI.   Physical Exam:  BP 118/77   Pulse 88   Temp 98.2 F (36.8 C)   Ht _0  (1.575 m)   Wt 180 lb 9.6 oz (81.9 kg)   BMI 33.03 kg/m  No LMP recorded.  Patient is postmenopausal. Psych:  Alert and cooperative. Normal mood and affect. General:   Alert,  Well-developed, well-nourished, pleasant and cooperative in NAD Head:  Normocephalic and atraumatic. Eyes:  Sclera clear, no icterus.   Conjunctiva pink. Ears:  Normal auditory acuity. Nose:  No deformity, discharge, or lesions. Mouth:  No deformity or lesions,oropharynx pink & moist. Neck:  Supple; no masses or thyromegaly. Lungs:  Respirations even and unlabored.  Clear throughout to auscultation.   No wheezes, crackles, or rhonchi. No acute distress. Heart:  Regular rate and rhythm; no murmurs, clicks, rubs, or gallops. Abdomen:  Normal bowel sounds.  No bruits.  Soft, non-tender and non-distended without masses, hepatosplenomegaly or hernias noted.  No guarding or rebound tenderness.    Neurologic:  Alert and oriented x3;  grossly normal neurologically. Skin:  Intact without significant lesions or rashes. No jaundice. Lymph Nodes:  No significant cervical adenopathy. Psych:  Alert and cooperative. Normal mood and affect.  Imaging Studies: No results found.  Assessment and Plan:   Nicole Washington is a 68 y.o. y/o female has been referred for elevated alkaline phosphatase.  Differentials include fatty liver disease as she has gained weight recently.  This is the heaviest she says she has ever weighed.  In addition medication such as Lamictal can increase liver function tests.  Since they are only minimally elevated we have time to evaluate and rule out other causes.  I do not think any of her medications need to be stopped at this point of time.  Plan 1.  Complete liver work-up including for autoimmune disorders, viral hepatitis.  In addition we will check PTH, vitamin D, calcium, GGT.  Right upper quadrant ultrasound.  Follow up in 4-5 weeks   Dr Jonathon Bellows MD,MRCP(U.K)

## 2018-11-17 LAB — CBC WITH DIFFERENTIAL/PLATELET
Basophils Absolute: 0.1 10*3/uL (ref 0.0–0.2)
Basos: 1 %
EOS (ABSOLUTE): 0.1 10*3/uL (ref 0.0–0.4)
Eos: 2 %
Hematocrit: 39.9 % (ref 34.0–46.6)
Hemoglobin: 13.2 g/dL (ref 11.1–15.9)
Immature Grans (Abs): 0 10*3/uL (ref 0.0–0.1)
Immature Granulocytes: 0 %
Lymphocytes Absolute: 1.6 10*3/uL (ref 0.7–3.1)
Lymphs: 20 %
MCH: 27.7 pg (ref 26.6–33.0)
MCHC: 33.1 g/dL (ref 31.5–35.7)
MCV: 84 fL (ref 79–97)
Monocytes Absolute: 0.4 10*3/uL (ref 0.1–0.9)
Monocytes: 5 %
Neutrophils Absolute: 5.7 10*3/uL (ref 1.4–7.0)
Neutrophils: 72 %
Platelets: 373 10*3/uL (ref 150–450)
RBC: 4.76 x10E6/uL (ref 3.77–5.28)
RDW: 14.6 % (ref 11.7–15.4)
WBC: 7.9 10*3/uL (ref 3.4–10.8)

## 2018-11-17 LAB — ANTI-MICROSOMAL ANTIBODY LIVER / KIDNEY: LKM1 Ab: 0.8 Units (ref 0.0–20.0)

## 2018-11-17 LAB — ALPHA-1-ANTITRYPSIN: A-1 Antitrypsin: 121 mg/dL (ref 101–187)

## 2018-11-17 LAB — HEPATITIS C ANTIBODY: Hep C Virus Ab: <0.1 s/co ratio (ref 0.0–0.9)

## 2018-11-17 LAB — IRON,TIBC AND FERRITIN PANEL
Ferritin: 36 ng/mL (ref 15–150)
Iron Saturation: 15 % (ref 15–55)
Iron: 52 ug/dL (ref 27–139)
Total Iron Binding Capacity: 345 ug/dL (ref 250–450)
UIBC: 293 ug/dL (ref 118–369)

## 2018-11-17 LAB — COMPREHENSIVE METABOLIC PANEL
ALT: 22 IU/L (ref 0–32)
AST: 20 IU/L (ref 0–40)
Albumin/Globulin Ratio: 1.7 (ref 1.2–2.2)
Albumin: 4.5 g/dL (ref 3.8–4.8)
Alkaline Phosphatase: 126 IU/L — ABNORMAL HIGH (ref 39–117)
BUN/Creatinine Ratio: 19 (ref 12–28)
BUN: 17 mg/dL (ref 8–27)
Bilirubin Total: 0.3 mg/dL (ref 0.0–1.2)
CO2: 22 mmol/L (ref 20–29)
Calcium: 9.6 mg/dL (ref 8.7–10.3)
Chloride: 103 mmol/L (ref 96–106)
Creatinine, Ser: 0.89 mg/dL (ref 0.57–1.00)
GFR calc Af Amer: 76 mL/min/{1.73_m2} (ref >59)
GFR calc non Af Amer: 66 mL/min/{1.73_m2} (ref >59)
Globulin, Total: 2.7 g/dL (ref 1.5–4.5)
Glucose: 109 mg/dL — ABNORMAL HIGH (ref 65–99)
Potassium: 4.4 mmol/L (ref 3.5–5.2)
Sodium: 140 mmol/L (ref 134–144)
Total Protein: 7.2 g/dL (ref 6.0–8.5)

## 2018-11-17 LAB — HEPATITIS B CORE ANTIBODY, TOTAL: Hep B Core Total Ab: NEGATIVE

## 2018-11-17 LAB — HEPATITIS A ANTIBODY, TOTAL: hep A Total Ab: NEGATIVE

## 2018-11-17 LAB — HEPATITIS B E ANTIGEN: Hep B E Ag: NEGATIVE

## 2018-11-17 LAB — PTH, INTACT AND CALCIUM: PTH: 27 pg/mL (ref 15–65)

## 2018-11-17 LAB — ANA: Anti Nuclear Antibody (ANA): NEGATIVE

## 2018-11-17 LAB — IMMUNOGLOBULINS A/E/G/M, SERUM
IgA/Immunoglobulin A, Serum: 209 mg/dL (ref 87–352)
IgE (Immunoglobulin E), Serum: 11 IU/mL (ref 6–495)
IgG (Immunoglobin G), Serum: 963 mg/dL (ref 586–1602)
IgM (Immunoglobulin M), Srm: 56 mg/dL (ref 26–217)

## 2018-11-17 LAB — HEPATITIS B SURFACE ANTIBODY,QUALITATIVE: Hep B Surface Ab, Qual: NONREACTIVE

## 2018-11-17 LAB — CELIAC DISEASE AB SCREEN W/RFX
Antigliadin Abs, IgA: 5 units (ref 0–19)
Transglutaminase IgA: <2 U/mL (ref 0–3)

## 2018-11-17 LAB — MITOCHONDRIAL/SMOOTH MUSCLE AB PNL
Mitochondrial Ab: <20 Units (ref 0.0–20.0)
Smooth Muscle Ab: 7 Units (ref 0–19)

## 2018-11-17 LAB — CERULOPLASMIN: Ceruloplasmin: 34 mg/dL (ref 19.0–39.0)

## 2018-11-17 LAB — HEPATITIS B SURFACE ANTIGEN: Hepatitis B Surface Ag: NEGATIVE

## 2018-11-17 LAB — HEPATITIS B E ANTIBODY: Hep B E Ab: NEGATIVE

## 2018-11-17 LAB — GAMMA GT: GGT: 29 IU/L (ref 0–60)

## 2018-11-21 ENCOUNTER — Other Ambulatory Visit: Payer: Self-pay | Admitting: Family Medicine

## 2018-11-22 ENCOUNTER — Telehealth: Payer: Self-pay

## 2018-11-22 NOTE — Telephone Encounter (Signed)
patient IS COMING TO GET THE HEPATITIS A AND B VACCINE

## 2018-11-22 NOTE — Telephone Encounter (Signed)
Called and left a message for call back. Mailed letter

## 2018-11-22 NOTE — Telephone Encounter (Signed)
-----  Message from Jonathon Bellows, MD sent at 11/20/2018 10:00 AM EDT ----- Sherald Hess please inform Needs Hep A/B vaccine - all work up for liver so far negative. GGT is negative hence no active liver inflammation to suggest elevation in Alk phos is due to liver.   At this time only recommendation is to recheck q6 monthly and if there was further rise then will need liver bx

## 2018-11-23 ENCOUNTER — Other Ambulatory Visit: Payer: Self-pay

## 2018-11-23 ENCOUNTER — Ambulatory Visit
Admission: RE | Admit: 2018-11-23 | Discharge: 2018-11-23 | Disposition: A | Discharge status: Home / Self Care | Payer: Medicare Other | Source: Ambulatory Visit | Attending: Gastroenterology | Admitting: Gastroenterology

## 2018-11-23 DIAGNOSIS — R748 Abnormal levels of other serum enzymes: Secondary | ICD-10-CM | POA: Diagnosis not present

## 2018-11-23 NOTE — Telephone Encounter (Signed)
Pt calling to check on this refill.  Pt states that she is completely out of medication.

## 2018-11-25 ENCOUNTER — Ambulatory Visit (INDEPENDENT_AMBULATORY_CARE_PROVIDER_SITE_OTHER): Payer: Medicare Other | Admitting: Gastroenterology

## 2018-11-25 ENCOUNTER — Other Ambulatory Visit: Payer: Self-pay

## 2018-11-25 DIAGNOSIS — R748 Abnormal levels of other serum enzymes: Secondary | ICD-10-CM

## 2018-11-25 DIAGNOSIS — Z23 Encounter for immunization: Secondary | ICD-10-CM | POA: Diagnosis not present

## 2018-11-28 ENCOUNTER — Encounter: Payer: Self-pay | Admitting: Gastroenterology

## 2018-12-21 ENCOUNTER — Encounter (INDEPENDENT_AMBULATORY_CARE_PROVIDER_SITE_OTHER): Payer: Self-pay

## 2018-12-21 ENCOUNTER — Ambulatory Visit (INDEPENDENT_AMBULATORY_CARE_PROVIDER_SITE_OTHER): Payer: Medicare Other | Admitting: Gastroenterology

## 2018-12-21 ENCOUNTER — Other Ambulatory Visit: Payer: Self-pay | Admitting: Family Medicine

## 2018-12-21 ENCOUNTER — Other Ambulatory Visit: Payer: Self-pay

## 2018-12-21 VITALS — BP 128/82 | HR 90 | Temp 98.1°F | Ht 62.0 in | Wt 177.8 lb

## 2018-12-21 DIAGNOSIS — R748 Abnormal levels of other serum enzymes: Secondary | ICD-10-CM | POA: Diagnosis not present

## 2018-12-21 DIAGNOSIS — I2510 Atherosclerotic heart disease of native coronary artery without angina pectoris: Secondary | ICD-10-CM | POA: Diagnosis not present

## 2018-12-21 DIAGNOSIS — Z23 Encounter for immunization: Secondary | ICD-10-CM | POA: Diagnosis not present

## 2018-12-21 MED ORDER — TWINRIX 720-20 ELU-MCG/ML IM SUSP: 1.0000 mL | Freq: Once | INTRAMUSCULAR | 0 refills | Status: AC

## 2018-12-21 MED ORDER — ATORVASTATIN CALCIUM 40 MG PO TABS: 40.0000 mg | ORAL_TABLET | Freq: Every day | ORAL | 1 refills | Status: DC

## 2018-12-21 NOTE — Telephone Encounter (Signed)
Pt would like Dr. Caryl Bis to start refilling her atorvastatin (LIPITOR) 40 MG tablet  Her last refills were from her previous PCP/ Pt has enough for the next week or so/ please advise    Send to  [id number] [address] Rabon, [place] [phone] (Phone) [phone] (Fax)

## 2018-12-21 NOTE — Progress Notes (Signed)
Nicole Bellows MD, MRCP(U.K) [address]  [place]  [place], [place] [address]  Main: [phone]  Fax: [phone]   Primary Care Physician: Nicole Haven, MD  Primary Gastroenterologist:  Dr. Jonathon Washington   Follow-up for elevated alkaline phosphatase  HPI: Nicole Washington is a 68 y.o. female   Summary of history :  First noticed 5 months back with an alkaline phosphatase of 133 and when repeated 2 months back is 126.  Fractionated shows that a large component from the liver and the smaller component from the bone. TSH normal.  Transaminases normal.  Previously was completely normal  New medications started recently on Lamictal and Zoloft.  No family history of liver disease.  No prior tattoos, incarceration, blood transfusion, herbal supplements, illegal drug use, alcohol consumption.  Denies any pruritus.  Interval history  11/15/2018- 12/21/2018  11/23/2018: No abnormality is noted in the right upper quadrant of the abdomen. 11/15/2018: CMP normal except alkaline phosphatase elevated at 126, hepatitis A total antibody negative, hepatitis B core total antibody, hepatitis B E antigen/antibody, surface antibody/antigen, hepatitis C virus antibody ANA, smooth muscle, F-actin, AMA, ceruloplasmin, iron studies, immunoglobulins, celiac serology, alpha-1 antitrypsin, liver kidney microsomal antibody, PTH, calcium, GGT negative.  No complaints doing well. Current Outpatient Medications  Medication Sig Dispense Refill  . amLODipine (NORVASC) 5 MG tablet TAKE 1 TABLET BY MOUTH EVERY DAY 90 tablet 1  . aspirin EC 81 MG tablet Take 81 mg by mouth daily.    Marland Kitchen atorvastatin (LIPITOR) 40 MG tablet Take 40 mg by mouth daily.    . carvedilol (COREG) 3.125 MG tablet TAKE 1 TABLET (3.125 MG TOTAL) BY MOUTH 2 (TWO) TIMES DAILY WITH A MEAL. 180 tablet 1  . clonazePAM (KLONOPIN) 0.5 MG tablet TAKE 2 TABLETS (1 MG TOTAL) BY MOUTH DAILY AS NEEDED FOR ANXIETY. 60 tablet 0  . DULoxetine (CYMBALTA)  60 MG capsule Take 60 mg by mouth 2 (two) times daily.    Marland Kitchen gabapentin (NEURONTIN) 300 MG capsule Take 300 mg by mouth daily.     Marland Kitchen lamoTRIgine (LAMICTAL) 25 MG tablet Take 25 mg by mouth daily.    Marland Kitchen losartan (COZAAR) 25 MG tablet TAKE 1 TABLET BY MOUTH EVERY DAY 90 tablet 1  . QUEtiapine (SEROQUEL) 25 MG tablet Take 50 mg by mouth at bedtime.     . sertraline (ZOLOFT) 50 MG tablet Take 50 mg by mouth daily.     No current facility-administered medications for this visit.     Allergies as of 12/21/2018 - Review Complete 11/15/2018  Allergen Reaction Noted  . Codeine Itching 10/27/2016  . Tylenol [acetaminophen] Itching 10/27/2016    ROS:  General: Negative for anorexia, weight loss, fever, chills, fatigue, weakness. ENT: Negative for hoarseness, difficulty swallowing , nasal congestion. CV: Negative for chest pain, angina, palpitations, dyspnea on exertion, peripheral edema.  Respiratory: Negative for dyspnea at rest, dyspnea on exertion, cough, sputum, wheezing.  GI: See history of present illness. GU:  Negative for dysuria, hematuria, urinary incontinence, urinary frequency, nocturnal urination.  Endo: Negative for unusual weight change.    Physical Examination:   There were no vitals taken for this visit.  General: Well-nourished, well-developed in no acute distress.  Eyes: No icterus. Conjunctivae pink. Mouth: Oropharyngeal mucosa moist and pink , no lesions erythema or exudate. Lungs: Clear to auscultation bilaterally. Non-labored. Heart: Regular rate and rhythm, no murmurs rubs or gallops.  Abdomen: Bowel sounds are normal, nontender, nondistended, no hepatosplenomegaly or masses, no abdominal  bruits or hernia , no rebound or guarding.   Extremities: No lower extremity edema. No clubbing or deformities. Neuro: Alert and oriented x 3.  Grossly intact. Skin: Warm and dry, no jaundice.   Psych: Alert and cooperative, normal mood and affect.   Imaging Studies: US Abdomen  Limited Ruq  Result Date: 11/23/2018 CLINICAL DATA:  Elevated alkaline phosphatase level. EXAM: ULTRASOUND ABDOMEN LIMITED RIGHT UPPER QUADRANT COMPARISON:  None. FINDINGS: Gallbladder: No gallstones or wall thickening visualized. No sonographic Murphy sign noted by sonographer. Common bile duct: Diameter: 3 mm which is within normal limits. Liver: No focal lesion identified. Within normal limits in parenchymal echogenicity. Portal vein is patent on color Doppler imaging with normal direction of blood flow towards the liver. Other: None. IMPRESSION: No abnormality is noted in the right upper quadrant of the abdomen. Electronically Signed   By: Nicole Washington M.D.   On: 11/23/2018 16:09    Assessment and Plan:   Nicole Washington is a 68 y.o. y/o female here to follow up  for elevated alkaline phosphatase.  Differentials include fatty liver disease as she has gained weight recently.  This is the heaviest she says she has ever weighed.  In addition medication such as Lamictal can increase liver function tests.  Since they are only minimally elevated we have time to evaluate and rule out other causes.  I do not think any of her medications need to be stopped at this point of time.  We will watch her LFTs every 6 months.  Since the GGT is normal it suggest that there is no active liver inflammation going on.  Plan 1. Repeat CMP every  6 months with Dr. Caryl Washington and if it increases to over two 1/2 times the upper limit of normal then may need to consider liver biopsy or if it is rising we may need to consider an MRCP at that point. 2  Complete vaccination for hepatitis A and B  Dr Nicole Bellows  MD,MRCP [facility]) Follow up in as needed

## 2018-12-21 NOTE — Telephone Encounter (Signed)
Sent to pharmacy 

## 2018-12-24 ENCOUNTER — Other Ambulatory Visit: Payer: Self-pay | Admitting: Family Medicine

## 2018-12-26 DIAGNOSIS — F39 Unspecified mood [affective] disorder: Secondary | ICD-10-CM | POA: Diagnosis not present

## 2018-12-26 DIAGNOSIS — F5105 Insomnia due to other mental disorder: Secondary | ICD-10-CM | POA: Diagnosis not present

## 2018-12-26 DIAGNOSIS — F411 Generalized anxiety disorder: Secondary | ICD-10-CM | POA: Diagnosis not present

## 2018-12-26 DIAGNOSIS — F4312 Post-traumatic stress disorder, chronic: Secondary | ICD-10-CM | POA: Diagnosis not present

## 2018-12-26 MED ORDER — CLONAZEPAM 0.5 MG PO TABS: 1.0000 mg | ORAL_TABLET | Freq: Every day | ORAL | 0 refills | Status: DC | PRN

## 2018-12-26 NOTE — Telephone Encounter (Signed)
clonazePAM (KLONOPIN) 0.5 MG tablet  Pt also needs the above   [facility] #[id number] Rabon, [place] (Phone) [phone] (Fax)

## 2019-01-16 ENCOUNTER — Ambulatory Visit (INDEPENDENT_AMBULATORY_CARE_PROVIDER_SITE_OTHER): Payer: Medicare Other | Admitting: Family Medicine

## 2019-01-16 ENCOUNTER — Other Ambulatory Visit: Payer: Self-pay

## 2019-01-16 ENCOUNTER — Encounter: Payer: Self-pay | Admitting: Family Medicine

## 2019-01-16 VITALS — Ht 62.0 in | Wt 182.0 lb

## 2019-01-16 DIAGNOSIS — I2510 Atherosclerotic heart disease of native coronary artery without angina pectoris: Secondary | ICD-10-CM

## 2019-01-16 DIAGNOSIS — F320 Major depressive disorder, single episode, mild: Secondary | ICD-10-CM | POA: Diagnosis not present

## 2019-01-16 DIAGNOSIS — R748 Abnormal levels of other serum enzymes: Secondary | ICD-10-CM | POA: Diagnosis not present

## 2019-01-16 DIAGNOSIS — M5412 Radiculopathy, cervical region: Secondary | ICD-10-CM | POA: Diagnosis not present

## 2019-01-16 DIAGNOSIS — R202 Paresthesia of skin: Secondary | ICD-10-CM

## 2019-01-16 DIAGNOSIS — I10 Essential (primary) hypertension: Secondary | ICD-10-CM

## 2019-01-16 NOTE — Progress Notes (Signed)
Virtual Visit via video Note  This visit type was conducted due to national recommendations for restrictions regarding the COVID-19 pandemic (e.g. social distancing).  This format is felt to be most appropriate for this patient at this time.  All issues noted in this document were discussed and addressed.  No physical exam was performed (except for noted visual exam findings with Video Visits).   I connected with Nicole Washington today at  1:45 PM EST by a video enabled telemedicine application or telephone and verified that I am speaking with the correct person using two identifiers. Location patient: home Location provider: work Persons participating in the virtual visit: patient, provider  I discussed the limitations, risks, security and privacy concerns of performing an evaluation and management service by telephone and the availability of in person appointments. I also discussed with the patient that there may be a patient responsible charge related to this service. The patient expressed understanding and agreed to proceed.   Reason for visit: follow-up  HPI: Hypertension: Typically 124/78.  Taking amlodipine and carvedilol.  No chest pain, shortness breath, or edema.  Depression: No she is doing relatively okay with this.  The holidays are tough and with COVID-19 she is not able to see her family.  She is taking Cymbalta, Zoloft, Seroquel, and Lamictal.  She does see psychiatry and follows with them every 3 months.  She sees them in about a week and a half.  No SI.  Head tingling: She continues to have issues with this.  Occurs most days.  She will take 2 gabapentin and Klonopin and that seems to help.  She notes that she cannot touch her head or neck when this occurs.  Notes it is in her bilateral scalp and bilateral posterior neck.  She saw dermatology and they placed her on the gabapentin.  She saw endocrinology and they suggested that it may be psychiatric related.  She has discussed this  with her psychiatrist and she reports they agree that it potentially could be psychiatric related.  She wonders if it could be a pinched nerve in her neck.  She has had no imaging evaluation.  She did see neurology for this and underwent an EEG which was normal.  Neurology did not feel as though this represented a primary neurological issue.  Elevated alkaline phosphatase: She saw GI and endocrinology for this.  It was found to be predominantly liver related.  Lab evaluation was unremarkable for specific cause.  GI recommended every 6 month LFT checks.  If the alkaline phosphatase increases they discussed liver biopsy versus MRCP.   ROS: See pertinent positives and negatives per HPI.  Past Medical History:  Diagnosis Date  . Burning mouth syndrome   . CAD (coronary artery disease)   . Cancer (HCC)    basal and squamous cell  . Depression   . GERD (gastroesophageal reflux disease)   . Hyperlipidemia   . Hypertension   . Thyroid disease     Past Surgical History:  Procedure Laterality Date  . BREAST BIOPSY Right    Neg  . CESAREAN SECTION      Family History  Problem Relation Age of Onset  . Cerebral aneurysm Mother 57  . COPD Father   . Cancer Brother        ? colon  . Lupus Other   . Heart Problems Paternal Grandfather   . Breast cancer Neg Hx     SOCIAL HX: former smoker   Current Outpatient Medications:  .  amLODipine (NORVASC) 5 MG tablet, TAKE 1 TABLET BY MOUTH EVERY DAY, Disp: 90 tablet, Rfl: 1 .  aspirin EC 81 MG tablet, Take 81 mg by mouth daily., Disp: , Rfl:  .  atorvastatin (LIPITOR) 40 MG tablet, Take 1 tablet (40 mg total) by mouth daily., Disp: 90 tablet, Rfl: 1 .  carvedilol (COREG) 3.125 MG tablet, TAKE 1 TABLET (3.125 MG TOTAL) BY MOUTH 2 (TWO) TIMES DAILY WITH A MEAL., Disp: 180 tablet, Rfl: 1 .  clonazePAM (KLONOPIN) 0.5 MG tablet, Take 2 tablets (1 mg total) by mouth daily as needed for anxiety., Disp: 60 tablet, Rfl: 0 .  DULoxetine (CYMBALTA) 60 MG  capsule, Take 60 mg by mouth 2 (two) times daily., Disp: , Rfl:  .  gabapentin (NEURONTIN) 300 MG capsule, Take 300 mg by mouth daily. , Disp: , Rfl:  .  lamoTRIgine (LAMICTAL) 25 MG tablet, Take 25 mg by mouth daily., Disp: , Rfl:  .  losartan (COZAAR) 25 MG tablet, TAKE 1 TABLET BY MOUTH EVERY DAY, Disp: 90 tablet, Rfl: 1 .  QUEtiapine (SEROQUEL) 25 MG tablet, Take 50 mg by mouth at bedtime. , Disp: , Rfl:  .  sertraline (ZOLOFT) 50 MG tablet, Take 50 mg by mouth daily., Disp: , Rfl:   EXAM:  VITALS per patient if applicable: none  GENERAL: alert, oriented, appears well and in no acute distress  HEENT: atraumatic, conjunttiva clear, no obvious abnormalities on inspection of external nose and ears  NECK: normal movements of the head and neck  LUNGS: on inspection no signs of respiratory distress, breathing rate appears normal, no obvious gross SOB, gasping or wheezing  CV: no obvious cyanosis  MS: moves all visible extremities without noticeable abnormality  PSYCH/NEURO: pleasant and cooperative, no obvious depression or anxiety, speech and thought processing grossly intact  ASSESSMENT AND PLAN:  Discussed the following assessment and plan:  Hypertension Adequately controlled.  Continue current regimen.  Tingling Located in her scalp and neck.  She is undergoing evaluation through neurology and has seen endocrinology as well as psychiatry and discussed this.  It could be psychiatric related.  We will get an x-ray of her neck to evaluate for possible nerve impingement.  Elevated alkaline phosphatase level Has been relatively stable and mildly elevated.  She underwent evaluation with endocrinology and GI.  Plan is to recheck levels every 6 months.  Depression, major, single episode, mild (HCC) Chronic issue.  Relatively stable.  She will continue to see her psychiatrist.    I discussed the assessment and treatment plan with the patient. The patient was provided an opportunity  to ask questions and all were answered. The patient agreed with the plan and demonstrated an understanding of the instructions.   The patient was advised to call back or seek an in-person evaluation if the symptoms worsen or if the condition fails to improve as anticipated.   Tommi Rumps, MD

## 2019-01-17 ENCOUNTER — Ambulatory Visit (INDEPENDENT_AMBULATORY_CARE_PROVIDER_SITE_OTHER)
Admission: RE | Admit: 2019-01-17 | Discharge: 2019-01-17 | Disposition: A | Discharge status: Home / Self Care | Payer: Medicare Other | Source: Ambulatory Visit | Attending: Family Medicine | Admitting: Family Medicine

## 2019-01-17 DIAGNOSIS — F320 Major depressive disorder, single episode, mild: Secondary | ICD-10-CM | POA: Insufficient documentation

## 2019-01-17 DIAGNOSIS — M5412 Radiculopathy, cervical region: Secondary | ICD-10-CM

## 2019-01-17 DIAGNOSIS — F419 Anxiety disorder, unspecified: Secondary | ICD-10-CM | POA: Insufficient documentation

## 2019-01-17 NOTE — Assessment & Plan Note (Signed)
Adequately controlled.  Continue current regimen. 

## 2019-01-17 NOTE — Assessment & Plan Note (Signed)
Chronic issue.  Relatively stable.  She will continue to see her psychiatrist.

## 2019-01-17 NOTE — Assessment & Plan Note (Signed)
Located in her scalp and neck.  She is undergoing evaluation through neurology and has seen endocrinology as well as psychiatry and discussed this.  It could be psychiatric related.  We will get an x-ray of her neck to evaluate for possible nerve impingement.

## 2019-01-17 NOTE — Assessment & Plan Note (Signed)
Has been relatively stable and mildly elevated.  She underwent evaluation with endocrinology and GI.  Plan is to recheck levels every 6 months.

## 2019-01-20 ENCOUNTER — Telehealth: Payer: Self-pay | Admitting: *Deleted

## 2019-01-20 DIAGNOSIS — M5030 Other cervical disc degeneration, unspecified cervical region: Secondary | ICD-10-CM

## 2019-01-20 NOTE — Telephone Encounter (Signed)
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 Given her persistent symptoms and these findings I think it would be worthwhile pursuing an MRI to evaluate for nerve impingement as a cause of her symptoms. Please see if she is willing to complete this. 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 Thanks."; she would like to pursue the MRI, and she denies metal in her body or pacemaker; the pt can be contacted at [phone]; will route to office for notification.

## 2019-01-20 NOTE — Telephone Encounter (Signed)
MRI ordered

## 2019-01-20 NOTE — Addendum Note (Signed)
Addended by: Leone Haven on: 01/20/2019 02:48 PM   Modules accepted: Orders

## 2019-01-24 ENCOUNTER — Telehealth: Payer: Self-pay | Admitting: Family Medicine

## 2019-01-24 NOTE — Telephone Encounter (Signed)
Pt states she is returning call. Reviewed cervical spine Xray note, states she has received those results. Made aware of note 12/4 from Dr. Caryl Bis MRI has been ordered.Pt verbalizes understanding.

## 2019-01-25 ENCOUNTER — Other Ambulatory Visit: Payer: Self-pay | Admitting: Family Medicine

## 2019-01-25 NOTE — Telephone Encounter (Signed)
Refilled: 12/26/2018 Last OV: 01/16/2019 Next OV: not scheduled

## 2019-02-02 ENCOUNTER — Other Ambulatory Visit: Payer: Self-pay

## 2019-02-02 ENCOUNTER — Ambulatory Visit
Admission: RE | Admit: 2019-02-02 | Discharge: 2019-02-02 | Disposition: A | Discharge status: Home / Self Care | Payer: Medicare Other | Source: Ambulatory Visit | Attending: Family Medicine | Admitting: Family Medicine

## 2019-02-02 DIAGNOSIS — M5030 Other cervical disc degeneration, unspecified cervical region: Secondary | ICD-10-CM | POA: Insufficient documentation

## 2019-02-08 ENCOUNTER — Telehealth: Payer: Self-pay | Admitting: Family Medicine

## 2019-02-08 DIAGNOSIS — M5030 Other cervical disc degeneration, unspecified cervical region: Secondary | ICD-10-CM

## 2019-02-08 NOTE — Telephone Encounter (Signed)
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  Given these findings I would like for her to see a neurosurgeon to see what the next steps might be regarding management.  Management could include surgical intervention versus injections.  I can place a referral if she is willing to see them.

## 2019-02-08 NOTE — Telephone Encounter (Signed)
This patient stated she had not heard from anyone about her MRI results.  The results are there can you let me know what to tell the patient.  Timouthy Gilardi,cma

## 2019-02-08 NOTE — Telephone Encounter (Signed)
Pt called about MRI results. Please give her a call back at [phone]

## 2019-02-09 NOTE — Addendum Note (Signed)
Addended by: Caryl Bis, Judithann Villamar G on: 02/09/2019 02:10 PM   Modules accepted: Orders

## 2019-02-09 NOTE — Telephone Encounter (Signed)
Referral placed.

## 2019-02-21 DIAGNOSIS — G629 Polyneuropathy, unspecified: Secondary | ICD-10-CM | POA: Diagnosis not present

## 2019-03-04 ENCOUNTER — Other Ambulatory Visit: Payer: Self-pay | Admitting: Family Medicine

## 2019-03-07 ENCOUNTER — Other Ambulatory Visit: Payer: Self-pay | Admitting: Lab

## 2019-03-16 ENCOUNTER — Other Ambulatory Visit: Payer: Self-pay

## 2019-03-20 ENCOUNTER — Other Ambulatory Visit: Payer: Self-pay

## 2019-03-20 ENCOUNTER — Ambulatory Visit (INDEPENDENT_AMBULATORY_CARE_PROVIDER_SITE_OTHER): Payer: Medicare Other | Admitting: Internal Medicine

## 2019-03-20 VITALS — BP 128/78 | HR 92 | Temp 98.0°F | Ht 62.0 in | Wt 169.4 lb

## 2019-03-20 DIAGNOSIS — R748 Abnormal levels of other serum enzymes: Secondary | ICD-10-CM | POA: Diagnosis not present

## 2019-03-20 LAB — COMPREHENSIVE METABOLIC PANEL
ALT: 21 U/L (ref 0–35)
AST: 17 U/L (ref 0–37)
Albumin: 4.4 g/dL (ref 3.5–5.2)
Alkaline Phosphatase: 117 U/L (ref 39–117)
BUN: 14 mg/dL (ref 6–23)
CO2: 30 mEq/L (ref 19–32)
Calcium: 9.4 mg/dL (ref 8.4–10.5)
Chloride: 99 mEq/L (ref 96–112)
Creatinine, Ser: 0.82 mg/dL (ref 0.40–1.20)
GFR: 69 mL/min (ref >60.00)
Glucose, Bld: 108 mg/dL — ABNORMAL HIGH (ref 70–99)
Potassium: 3.7 mEq/L (ref 3.5–5.1)
Sodium: 137 mEq/L (ref 135–145)
Total Bilirubin: 0.5 mg/dL (ref 0.2–1.2)
Total Protein: 7.8 g/dL (ref 6.0–8.3)

## 2019-03-20 NOTE — Progress Notes (Signed)
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 female with a past medical history of Dyslipidemia, HTN, GAD, PTSD, GERD, fibromyalgia and OA . The patient presented for initial endocrinology clinic visit on 03/20/2019 for consultative assistance with her Cushing phenomenon .    Right knee intra-articular injection last Thursday otherwise she has not received any other forms of glucocorticoids.    Had a DXA scan years ago which was normal.  She is not on biotin.   HISTORICAL SUMMARY: The patientpresented to Rheumatology  in March, 2020 for evaluation of autoimmune disease, and had endorsed weight gain of 30-35 lbs over the past year and was noted to have centripetal fat distribution and a buffalo hump, she was sent here for further evaluation of cushing syndrome.  She was started on Lamictal and Zoloft sometime in March, 2020  Dexamethasone suppression test was negative 06/2018 Calcium and PTH levels normal 08/2018 and normal DXA scan.   Alk Phos isozymes showed low bone isozyme at 24 (28-66%) but elevated liver isozyme 74 ( 25-69%)    SUBJECTIVE:   During last visit (09/16/2018/2020): Dexamethasone suppression test negative 06/2018. TSH and T3 normal.  Today (09/16/18):  Nicole Washington is here for a follow up on elevated Alk. Phos. She has lost weight since her labs visit here.  Has fibromyalgia hence has chronic pain syndrome as well as DJD .  Has tingling in the head and radiated to her arms, she is going to see a neurologist for a second opinion No new onset musculoskeletal pain   Denies abdominal pain   HISTORY:  Past Medical History:  Past Medical History:  Diagnosis Date  . Burning mouth syndrome   . CAD (coronary artery disease)   . Cancer (HCC)    basal and  squamous cell  . Depression   . GERD (gastroesophageal reflux disease)   . Hyperlipidemia   . Hypertension   . Thyroid disease    Past Surgical History:  Past Surgical History:  Procedure Laterality Date  . BREAST BIOPSY Right    Neg  . CESAREAN SECTION        Social History:  reports that she quit smoking about 29 years ago. She has never used smokeless tobacco. She reports current alcohol use. She reports that she does not use drugs.  Family History: family history includes COPD in her father; Cancer in her brother; Cerebral aneurysm (age of onset: 38) in her mother; Heart Problems in her paternal grandfather; Lupus in an other family member.   HOME MEDICATIONS: Allergies as of 03/20/2019      Reactions   Codeine Itching   Tylenol [acetaminophen] Itching      Medication List       Accurate as of March 20, 2019  7:22 AM. If you have any questions, ask your nurse or doctor.        amLODipine 5 MG tablet Commonly known as: NORVASC TAKE 1 TABLET BY MOUTH EVERY DAY   aspirin EC 81 MG tablet Take 81 mg by mouth daily.   atorvastatin 40 MG tablet Commonly known as: LIPITOR Take 1 tablet (40 mg total) by mouth daily.   carvedilol 3.125 MG tablet Commonly known as: COREG TAKE 1 TABLET (  3.125 MG TOTAL) BY MOUTH 2 (TWO) TIMES DAILY WITH A MEAL.   clonazePAM 0.5 MG tablet Commonly known as: KLONOPIN TAKE 2 TABLETS (1 MG TOTAL) BY MOUTH DAILY AS NEEDED FOR ANXIETY.   DULoxetine 60 MG capsule Commonly known as: CYMBALTA Take 60 mg by mouth 2 (two) times daily.   gabapentin 300 MG capsule Commonly known as: NEURONTIN Take 300 mg by mouth daily.   lamoTRIgine 25 MG tablet Commonly known as: LAMICTAL Take 25 mg by mouth daily.   losartan 25 MG tablet Commonly known as: COZAAR TAKE 1 TABLET BY MOUTH EVERY DAY   QUEtiapine 25 MG tablet Commonly known as: SEROQUEL Take 50 mg by mouth at bedtime.   sertraline 50 MG tablet Commonly known as: ZOLOFT Take 50 mg  by mouth daily.         REVIEW OF SYSTEMS: A comprehensive ROS was conducted with the patient and is negative except as per HPI and below:  Review of Systems  Constitutional: Negative for fever, malaise/fatigue and weight loss.  HENT: Negative for congestion and sore throat.   Eyes: Negative for blurred vision and pain.  Respiratory: Negative for cough and shortness of breath.   Cardiovascular: Negative for chest pain and palpitations.  Gastrointestinal: Positive for heartburn. Negative for diarrhea and nausea.  Genitourinary: Negative for frequency.  Musculoskeletal: Positive for back pain and joint pain.       Right knee   Skin: Negative.   Neurological: Positive for sensory change. Negative for weakness.  Endo/Heme/Allergies: Negative for polydipsia.  Psychiatric/Behavioral: Negative for depression. The patient is not nervous/anxious.        OBJECTIVE:  VS: There were no vitals taken for this visit.   Wt Readings from Last 3 Encounters:  01/16/19 182 lb (82.6 kg)  12/21/18 177 lb 12.8 oz (80.6 kg)  11/15/18 180 lb 9.6 oz (81.9 kg)     EXAM: General: Pt appears well and is in NAD  Neck: General: Supple without adenopathy. Thyroid: Thyroid size normal.  No goiter or nodules appreciated. No thyroid bruit.  Lungs: Clear with good BS bilat with no rales, rhonchi, or wheezes  Heart: Auscultation: RRR.  Abdomen: Normoactive bowel sounds, soft, nontender, without masses or organomegaly palpable  Extremities: BL LE: No pretibial edema normal ROM and strength.  Skin: Hair: Texture and amount normal with gender appropriate distribution Skin Inspection: No rashes. Skin Palpation: Skin temperature, texture, and thickness normal to palpation  Mental Status: Judgment, insight: Intact Orientation: Oriented to time, place, and person Mood and affect: No depression, anxiety, or agitation      DATA REVIEWED: Results for Nicole Washington (MRN [id number]) as of 03/21/2019 16:23  Ref.  Range 03/20/2019 11:16  Sodium Latest Ref Range: 135 - 145 mEq/L 137  Potassium Latest Ref Range: 3.5 - 5.1 mEq/L 3.7  Chloride Latest Ref Range: 96 - 112 mEq/L 99  CO2 Latest Ref Range: 19 - 32 mEq/L 30  Glucose Latest Ref Range: 70 - 99 mg/dL 108 (H)  BUN Latest Ref Range: 6 - 23 mg/dL 14  Creatinine Latest Ref Range: 0.40 - 1.20 mg/dL 0.82  Calcium Latest Ref Range: 8.4 - 10.5 mg/dL 9.4  Alkaline Phosphatase Latest Ref Range: 39 - 117 U/L 117  Albumin Latest Ref Range: 3.5 - 5.2 g/dL 4.4  AST Latest Ref Range: 0 - 37 U/L 17  ALT Latest Ref Range: 0 - 35 U/L 21  Total Protein Latest Ref Range: 6.0 - 8.3 g/dL 7.8  Total Bilirubin Latest  Ref Range: 0.2 - 1.2 mg/dL 0.5  GFR Latest Ref Range: >60.00 mL/min 69.00     05/11/2018  Cortisol 11.9 ug/dL  TSH 2.910 uIU/mL  T4 5.6 ug/dL  T3 uptake 16 ( 24-39) % FT index 0.9 (1.2-4.9 ) Gluc 115 mg/dL  GFR 72 Na 140 mmol/L K 4.2 mmol/L  Ca 9.7 mg/dL (corrected 9.3) Alk Phos 148 (39-117) AST 22 IU/L ALT 28 IU/L   H/H 13.2 / 38.6     Results for RUMAYSA, SABATINO (MRN [id number]) as of 09/21/2018 10:56  Ref. Range 09/16/2018 11:27  Bone Isoenzymes Latest Ref Range: 28 - 66 % 24 (L)  Intestinal Isoenzymes Latest Ref Range: 1 - 24 % 2  Liver Isoenzymes Latest Ref Range: 25 - 69 % 74 (H)   ASSESSMENT/PLAN/RECOMMENDATIONS:   1. Elevated Alkaline Phosphatase:   - Initially this was thought to be of bone origin given normal GGT , but the  Alkaline Phos. Isozymes results show elevation of the hepatic content.  - DXA is normal -There is no evidence of hypothyroidism, hyperparathyroidism, or osteomalacia -Patient has been evaluated by gastroenterology, weight loss has been advised -I have placed the patient on the recent weight loss and lifestyle changes, repeat alkaline phosphatase is normal this is most likely due to the recent lifestyle changes and weight loss.     F/u as needed   Signed electronically by: Mack Guise,  MD  [facility]  [facility] [address] [place], [place] [address] Phone: [phone] FAX: [phone]   CC: Nicole Haven, MD [address] [place] [address] Phone: [phone] Fax: [phone]   Return to Endocrinology clinic as below: Future Appointments  Date Time Provider [place]  03/20/2019 11:10 AM Kellin Fifer, Melanie Crazier, MD [facility] None  10/17/2019 11:00 AM O'Brien-Blaney, Denisa L, LPN [facility]

## 2019-03-21 ENCOUNTER — Encounter: Payer: Self-pay | Admitting: Internal Medicine

## 2019-03-22 DIAGNOSIS — F4312 Post-traumatic stress disorder, chronic: Secondary | ICD-10-CM | POA: Diagnosis not present

## 2019-03-22 DIAGNOSIS — F39 Unspecified mood [affective] disorder: Secondary | ICD-10-CM | POA: Diagnosis not present

## 2019-03-22 DIAGNOSIS — Z79899 Other long term (current) drug therapy: Secondary | ICD-10-CM | POA: Diagnosis not present

## 2019-03-22 DIAGNOSIS — F411 Generalized anxiety disorder: Secondary | ICD-10-CM | POA: Diagnosis not present

## 2019-03-22 DIAGNOSIS — F5105 Insomnia due to other mental disorder: Secondary | ICD-10-CM | POA: Diagnosis not present

## 2019-03-29 ENCOUNTER — Other Ambulatory Visit: Payer: Self-pay | Admitting: Family Medicine

## 2019-04-07 ENCOUNTER — Other Ambulatory Visit: Payer: Self-pay | Admitting: Family Medicine

## 2019-04-07 DIAGNOSIS — E538 Deficiency of other specified B group vitamins: Secondary | ICD-10-CM | POA: Diagnosis not present

## 2019-04-07 DIAGNOSIS — E559 Vitamin D deficiency, unspecified: Secondary | ICD-10-CM | POA: Diagnosis not present

## 2019-04-07 DIAGNOSIS — E531 Pyridoxine deficiency: Secondary | ICD-10-CM | POA: Diagnosis not present

## 2019-04-07 DIAGNOSIS — Z114 Encounter for screening for human immunodeficiency virus [HIV]: Secondary | ICD-10-CM | POA: Diagnosis not present

## 2019-04-07 DIAGNOSIS — E519 Thiamine deficiency, unspecified: Secondary | ICD-10-CM | POA: Diagnosis not present

## 2019-04-07 DIAGNOSIS — Z0184 Encounter for antibody response examination: Secondary | ICD-10-CM | POA: Diagnosis not present

## 2019-04-07 DIAGNOSIS — R202 Paresthesia of skin: Secondary | ICD-10-CM | POA: Diagnosis not present

## 2019-04-07 DIAGNOSIS — R200 Anesthesia of skin: Secondary | ICD-10-CM | POA: Diagnosis not present

## 2019-04-07 DIAGNOSIS — Z79899 Other long term (current) drug therapy: Secondary | ICD-10-CM | POA: Diagnosis not present

## 2019-04-07 NOTE — Telephone Encounter (Signed)
Refilled: 03/07/2019 Last OV: 01/16/2019 Next OV: not scheduled

## 2019-04-07 NOTE — Telephone Encounter (Signed)
Refill sent to pharmacy.  Patient needs to be scheduled for follow-up sometime in the next 1 to 2 months.

## 2019-04-10 ENCOUNTER — Other Ambulatory Visit: Payer: Self-pay | Admitting: Neurology

## 2019-04-10 DIAGNOSIS — R200 Anesthesia of skin: Secondary | ICD-10-CM | POA: Diagnosis not present

## 2019-04-10 DIAGNOSIS — Z79899 Other long term (current) drug therapy: Secondary | ICD-10-CM | POA: Diagnosis not present

## 2019-04-10 DIAGNOSIS — R202 Paresthesia of skin: Secondary | ICD-10-CM | POA: Diagnosis not present

## 2019-04-10 NOTE — Telephone Encounter (Signed)
Spoke with pt and scheduled her for a follow up with Dr. Caryl Bis.

## 2019-04-12 ENCOUNTER — Telehealth: Payer: Self-pay | Admitting: Family Medicine

## 2019-04-12 NOTE — Telephone Encounter (Signed)
Patient is scheduled to receive her first Covid vaccine this Thursday, 2/25. She wants to know since she has a autoimmune deficiency should she still get the injection?

## 2019-04-12 NOTE — Telephone Encounter (Signed)
She can still get the vaccine. I would recommend that she get it.

## 2019-04-12 NOTE — Telephone Encounter (Signed)
Left detailed message for patient. If any questions she can return call back.

## 2019-04-13 ENCOUNTER — Ambulatory Visit: Payer: Medicare Other | Attending: Internal Medicine

## 2019-04-13 DIAGNOSIS — Z23 Encounter for immunization: Secondary | ICD-10-CM

## 2019-04-13 NOTE — Progress Notes (Signed)
   Covid-19 Vaccination Clinic  Name:  Nicole Washington    MRN: [id number] DOB: 01/06/1951  04/13/2019  [name] was observed post Covid-19 immunization for 15 minutes without incidence. She was provided with Vaccine Information Sheet and instruction to access the V-Safe system.   [name] was instructed to call 911 with any severe reactions post vaccine: Marland Kitchen Difficulty breathing  . Swelling of your face and throat  . A fast heartbeat  . A bad rash all over your body  . Dizziness and weakness    Immunizations Administered    Name Date Dose VIS Date Route   Pfizer COVID-19 Vaccine 04/13/2019  9:51 AM 0.3 mL 01/27/2019 Intramuscular   Manufacturer: [place]   Lot: [id number]   [place]: [id number]

## 2019-04-20 ENCOUNTER — Ambulatory Visit
Admission: RE | Admit: 2019-04-20 | Discharge: 2019-04-20 | Disposition: A | Discharge status: Home / Self Care | Payer: Medicare Other | Source: Ambulatory Visit | Attending: Neurology | Admitting: Neurology

## 2019-04-20 ENCOUNTER — Other Ambulatory Visit: Payer: Self-pay

## 2019-04-20 DIAGNOSIS — R202 Paresthesia of skin: Secondary | ICD-10-CM | POA: Insufficient documentation

## 2019-05-03 DIAGNOSIS — R202 Paresthesia of skin: Secondary | ICD-10-CM | POA: Diagnosis not present

## 2019-05-03 DIAGNOSIS — R200 Anesthesia of skin: Secondary | ICD-10-CM | POA: Diagnosis not present

## 2019-05-03 DIAGNOSIS — Z79899 Other long term (current) drug therapy: Secondary | ICD-10-CM | POA: Diagnosis not present

## 2019-05-09 ENCOUNTER — Ambulatory Visit: Payer: Medicare Other | Attending: Internal Medicine

## 2019-05-09 DIAGNOSIS — Z23 Encounter for immunization: Secondary | ICD-10-CM

## 2019-05-09 NOTE — Progress Notes (Signed)
   Covid-19 Vaccination Clinic  Name:  Nicole Washington    MRN: [id number] DOB: 1951/01/14  05/09/2019  Nicole Washington was observed post Covid-19 immunization for 15 minutes without incident. She was provided with Vaccine Information Sheet and instruction to access the V-Safe system.   Nicole Washington was instructed to call 911 with any severe reactions post vaccine: Marland Kitchen Difficulty breathing  . Swelling of face and throat  . A fast heartbeat  . A bad rash all over body  . Dizziness and weakness   Immunizations Administered    Name Date Dose VIS Date Route   Pfizer COVID-19 Vaccine 05/09/2019  9:42 AM 0.3 mL 01/27/2019 Intramuscular   Manufacturer: [place]   Lot: [id number]   [place]: [id number]

## 2019-05-10 ENCOUNTER — Other Ambulatory Visit: Payer: Self-pay

## 2019-05-12 ENCOUNTER — Encounter: Payer: Self-pay | Admitting: Family Medicine

## 2019-05-12 ENCOUNTER — Other Ambulatory Visit: Payer: Self-pay

## 2019-05-12 ENCOUNTER — Ambulatory Visit (INDEPENDENT_AMBULATORY_CARE_PROVIDER_SITE_OTHER): Payer: Medicare Other | Admitting: Family Medicine

## 2019-05-12 VITALS — BP 110/70 | HR 93 | Temp 95.6°F | Ht 62.0 in | Wt 169.0 lb

## 2019-05-12 DIAGNOSIS — E785 Hyperlipidemia, unspecified: Secondary | ICD-10-CM

## 2019-05-12 DIAGNOSIS — E669 Obesity, unspecified: Secondary | ICD-10-CM | POA: Insufficient documentation

## 2019-05-12 DIAGNOSIS — E559 Vitamin D deficiency, unspecified: Secondary | ICD-10-CM | POA: Insufficient documentation

## 2019-05-12 DIAGNOSIS — R202 Paresthesia of skin: Secondary | ICD-10-CM | POA: Diagnosis not present

## 2019-05-12 DIAGNOSIS — I10 Essential (primary) hypertension: Secondary | ICD-10-CM | POA: Diagnosis not present

## 2019-05-12 DIAGNOSIS — K146 Glossodynia: Secondary | ICD-10-CM | POA: Diagnosis not present

## 2019-05-12 DIAGNOSIS — E66811 Obesity, class 1: Secondary | ICD-10-CM

## 2019-05-12 NOTE — Assessment & Plan Note (Signed)
Improved with Lyrica.  She will continue this.  She will continue to see neurology.

## 2019-05-12 NOTE — Progress Notes (Signed)
Tommi Rumps, MD Phone: [phone]  Nicole Washington is a 69 y.o. female who presents today for f/u.  HYPERTENSION  Disease Monitoring  Home BP Monitoring well controlled Chest pain- no    Dyspnea- no Medications  Compliance-  Taking amlodipine, coreg, losartan.  Edema- no  HYPERLIPIDEMIA Symptoms Chest pain on exertion:  no   Medications: Compliance- taking lipitor Right upper quadrant pain- no  Muscle aches- no  Scalp tingling: Patient saw neurology.  She had a negative nerve conduction study and negative MRI brain.  They started her on Lyrica which has been incredibly helpful.  She does still have some tingling though it is much improved.  She thinks it may be related to fibromyalgia.  Does occasionally take her clonazepam though not daily and has decreased that.  She does occasionally have a glass of wine and wonders if she can take that with the Lyrica.  Vitamin D deficiency: Diagnosed by neurology.  Started on once weekly supplementation in February.  Obesity: Weight has trended down with low-carb diet.  She has not been exercising significantly.    Social History   Tobacco Use  Smoking Status Former Smoker  . Quit date: 12/20/1989  . Years since quitting: 29.4  Smokeless Tobacco Never Used     ROS see history of present illness  Objective  Physical Exam Vitals:   05/12/19 1320  BP: 110/70  Pulse: 93  Temp: (!) 95.6 F (35.3 C)  SpO2: 95%    BP Readings from Last 3 Encounters:  05/12/19 110/70  03/20/19 128/78  12/21/18 128/82   Wt Readings from Last 3 Encounters:  05/12/19 169 lb (76.7 kg)  03/20/19 169 lb 6.4 oz (76.8 kg)  01/16/19 182 lb (82.6 kg)    Physical Exam Constitutional:      General: She is not in acute distress.    Appearance: She is not diaphoretic.  Cardiovascular:     Rate and Rhythm: Normal rate and regular rhythm.     Heart sounds: Normal heart sounds.  Pulmonary:     Effort: Pulmonary effort is normal.     Breath sounds:  Normal breath sounds.  Musculoskeletal:     Right lower leg: No edema.     Left lower leg: No edema.  Skin:    General: Skin is warm and dry.  Neurological:     Mental Status: She is alert.      Assessment/Plan: Please see individual problem list.  Hypertension Adequate control.  Continue current regimen.  Burning mouth syndrome Continue as needed clonazepam.  Hyperlipidemia Continue Lipitor.  Check lipid panel in the future.  Tingling Improved with Lyrica.  She will continue this.  She will continue to see neurology.  Vitamin D deficiency Check vitamin D in about 3 weeks.  She will continue her supplement.  Obesity (BMI 30.0-34.9) Continue diet changes.  Add in exercise.   Orders Placed This Encounter  Procedures  . Lipid panel    Standing Status:   Future    Standing Expiration Date:   05/11/2020  . Vitamin D (25 hydroxy)    Standing Status:   Future    Standing Expiration Date:   05/11/2020    No orders of the defined types were placed in this encounter.   This visit occurred during the SARS-CoV-2 public health emergency.  Safety protocols were in place, including screening questions prior to the visit, additional usage of staff PPE, and extensive cleaning of exam room while observing appropriate contact time as indicated  for disinfecting solutions.    Tommi Rumps, MD [place]

## 2019-05-12 NOTE — Assessment & Plan Note (Signed)
Continue diet changes.  Add in exercise.

## 2019-05-12 NOTE — Assessment & Plan Note (Signed)
Check vitamin D in about 3 weeks.  She will continue her supplement.

## 2019-05-12 NOTE — Patient Instructions (Signed)
Nice to see you. We will have you return for labs in about 3 weeks. Please add in some exercise to help with weight loss.

## 2019-05-12 NOTE — Assessment & Plan Note (Signed)
Continue as needed clonazepam 

## 2019-05-12 NOTE — Assessment & Plan Note (Signed)
Adequate control. Continue current regimen.  

## 2019-05-12 NOTE — Assessment & Plan Note (Signed)
Continue Lipitor.  Check lipid panel in the future.

## 2019-05-16 ENCOUNTER — Other Ambulatory Visit: Payer: Self-pay | Admitting: Family Medicine

## 2019-05-16 NOTE — Telephone Encounter (Signed)
Refill request for Klonopin, last seen 05-12-2019, last filled 04-07-19.  Please advise.

## 2019-06-02 ENCOUNTER — Other Ambulatory Visit: Payer: Self-pay

## 2019-06-02 ENCOUNTER — Encounter: Payer: Self-pay | Admitting: Family Medicine

## 2019-06-02 ENCOUNTER — Other Ambulatory Visit (INDEPENDENT_AMBULATORY_CARE_PROVIDER_SITE_OTHER): Payer: Medicare Other

## 2019-06-02 DIAGNOSIS — E785 Hyperlipidemia, unspecified: Secondary | ICD-10-CM | POA: Diagnosis not present

## 2019-06-02 DIAGNOSIS — E559 Vitamin D deficiency, unspecified: Secondary | ICD-10-CM | POA: Diagnosis not present

## 2019-06-02 LAB — VITAMIN D 25 HYDROXY (VIT D DEFICIENCY, FRACTURES): VITD: 37.89 ng/mL (ref 30.00–100.00)

## 2019-06-02 LAB — LIPID PANEL
Cholesterol: 146 mg/dL (ref 0–200)
HDL: 45.8 mg/dL (ref >39.00)
LDL Cholesterol: 66 mg/dL (ref 0–99)
NonHDL: 100.06
Total CHOL/HDL Ratio: 3
Triglycerides: 168 mg/dL — ABNORMAL HIGH (ref 0.0–149.0)
VLDL: 33.6 mg/dL (ref 0.0–40.0)

## 2019-06-21 ENCOUNTER — Other Ambulatory Visit: Payer: Self-pay | Admitting: Family Medicine

## 2019-07-04 ENCOUNTER — Other Ambulatory Visit: Payer: Self-pay | Admitting: Family Medicine

## 2019-07-04 NOTE — Telephone Encounter (Signed)
Refill request for klonopin, last seen 05-12-19, last filled 05-16-19.  Please advise.

## 2019-07-05 DIAGNOSIS — F4312 Post-traumatic stress disorder, chronic: Secondary | ICD-10-CM | POA: Diagnosis not present

## 2019-07-05 DIAGNOSIS — F5105 Insomnia due to other mental disorder: Secondary | ICD-10-CM | POA: Diagnosis not present

## 2019-07-05 DIAGNOSIS — F411 Generalized anxiety disorder: Secondary | ICD-10-CM | POA: Diagnosis not present

## 2019-07-05 DIAGNOSIS — F39 Unspecified mood [affective] disorder: Secondary | ICD-10-CM | POA: Diagnosis not present

## 2019-07-12 ENCOUNTER — Other Ambulatory Visit: Payer: Self-pay | Admitting: Family Medicine

## 2019-07-12 DIAGNOSIS — Z1231 Encounter for screening mammogram for malignant neoplasm of breast: Secondary | ICD-10-CM

## 2019-07-13 ENCOUNTER — Ambulatory Visit
Admission: RE | Admit: 2019-07-13 | Discharge: 2019-07-13 | Disposition: A | Discharge status: Home / Self Care | Payer: Medicare Other | Source: Ambulatory Visit | Attending: Family Medicine | Admitting: Family Medicine

## 2019-07-13 DIAGNOSIS — Z1231 Encounter for screening mammogram for malignant neoplasm of breast: Secondary | ICD-10-CM | POA: Diagnosis not present

## 2019-07-19 DIAGNOSIS — Z79899 Other long term (current) drug therapy: Secondary | ICD-10-CM | POA: Diagnosis not present

## 2019-07-19 DIAGNOSIS — F39 Unspecified mood [affective] disorder: Secondary | ICD-10-CM | POA: Diagnosis not present

## 2019-07-28 ENCOUNTER — Encounter: Payer: Self-pay | Admitting: Family Medicine

## 2019-07-28 ENCOUNTER — Other Ambulatory Visit: Payer: Self-pay

## 2019-07-28 ENCOUNTER — Ambulatory Visit (INDEPENDENT_AMBULATORY_CARE_PROVIDER_SITE_OTHER): Payer: Medicare Other | Admitting: Family Medicine

## 2019-07-28 VITALS — BP 118/70 | HR 92 | Temp 96.0°F | Ht 62.0 in | Wt 170.0 lb

## 2019-07-28 DIAGNOSIS — R202 Paresthesia of skin: Secondary | ICD-10-CM | POA: Diagnosis not present

## 2019-07-28 DIAGNOSIS — R221 Localized swelling, mass and lump, neck: Secondary | ICD-10-CM

## 2019-07-28 DIAGNOSIS — R200 Anesthesia of skin: Secondary | ICD-10-CM | POA: Diagnosis not present

## 2019-07-28 HISTORY — DX: Localized swelling, mass and lump, neck: R22.1

## 2019-07-28 NOTE — Assessment & Plan Note (Signed)
Concern for enlarged lymph node.  Check ultrasound.  She will be called to schedule this.  Advised to contact us if she does not get called to schedule.

## 2019-07-28 NOTE — Progress Notes (Signed)
  Tommi Rumps, MD Phone: [phone]  Nicole Washington is a 69 y.o. female who presents today for same-day visit.  Neck lump: She noticed this on her left neck 2 days ago.  There is no tenderness.  No fever.  No upper respiratory symptoms.  No rash.  Social History   Tobacco Use  Smoking Status Former Smoker  . Quit date: 12/20/1989  . Years since quitting: 29.6  Smokeless Tobacco Never Used     ROS see history of present illness  Objective  Physical Exam Vitals:   07/28/19 1525  BP: 118/70  Pulse: 92  Temp: (!) 96 F (35.6 C)  SpO2: 95%    BP Readings from Last 3 Encounters:  07/28/19 118/70  05/12/19 110/70  03/20/19 128/78   Wt Readings from Last 3 Encounters:  07/28/19 170 lb (77.1 kg)  05/12/19 169 lb (76.7 kg)  03/20/19 169 lb 6.4 oz (76.8 kg)    Physical Exam HENT:     Head: Normocephalic and atraumatic.     Comments: No rash on left scalp    Right Ear: Tympanic membrane normal.     Left Ear: Tympanic membrane normal.  Neck:     Comments: Posterior cervical chain with single enlarged likely lymph node that is freely mobile and nontender     Assessment/Plan: Please see individual problem list.  Lump in neck Concern for enlarged lymph node.  Check ultrasound.  She will be called to schedule this.  Advised to contact us if she does not get called to schedule.    Orders Placed This Encounter  Procedures  . US Soft Tissue Head/Neck (NON-THYROID)    Standing Status:   Future    Standing Expiration Date:   07/27/2020    Order Specific Question:   Reason for Exam (SYMPTOM  OR DIAGNOSIS REQUIRED)    Answer:   left posterior cervical soft tissue swelling, possible lymph node    Order Specific Question:   Preferred imaging location?    Answer:   [facility]    No orders of the defined types were placed in this encounter.   This visit occurred during the SARS-CoV-2 public health emergency.  Safety protocols were in place, including screening  questions prior to the visit, additional usage of staff PPE, and extensive cleaning of exam room while observing appropriate contact time as indicated for disinfecting solutions.    Tommi Rumps, MD [place]

## 2019-07-28 NOTE — Patient Instructions (Signed)
Nice to see you. We will get an ultrasound of the lesion. Somebody will contact you to schedule this in the next week or so.  If you do not hear anything please contact us.

## 2019-08-09 ENCOUNTER — Other Ambulatory Visit: Payer: Self-pay

## 2019-08-09 ENCOUNTER — Ambulatory Visit
Admission: RE | Admit: 2019-08-09 | Discharge: 2019-08-09 | Disposition: A | Discharge status: Home / Self Care | Payer: Medicare Other | Source: Ambulatory Visit | Attending: Family Medicine | Admitting: Family Medicine

## 2019-08-09 DIAGNOSIS — R221 Localized swelling, mass and lump, neck: Secondary | ICD-10-CM | POA: Insufficient documentation

## 2019-08-16 DIAGNOSIS — M545 Low back pain: Secondary | ICD-10-CM | POA: Diagnosis not present

## 2019-08-16 DIAGNOSIS — M25551 Pain in right hip: Secondary | ICD-10-CM | POA: Diagnosis not present

## 2019-08-23 ENCOUNTER — Other Ambulatory Visit: Payer: Self-pay | Admitting: Family Medicine

## 2019-08-24 DIAGNOSIS — M545 Low back pain: Secondary | ICD-10-CM | POA: Diagnosis not present

## 2019-09-11 DIAGNOSIS — M545 Low back pain: Secondary | ICD-10-CM | POA: Diagnosis not present

## 2019-09-18 ENCOUNTER — Other Ambulatory Visit: Payer: Self-pay

## 2019-09-18 ENCOUNTER — Encounter: Payer: Self-pay | Admitting: Family Medicine

## 2019-09-18 ENCOUNTER — Ambulatory Visit (INDEPENDENT_AMBULATORY_CARE_PROVIDER_SITE_OTHER): Payer: Medicare Other | Admitting: Family Medicine

## 2019-09-18 VITALS — BP 121/80 | HR 88 | Temp 98.6°F | Ht 62.0 in | Wt 172.0 lb

## 2019-09-18 DIAGNOSIS — K219 Gastro-esophageal reflux disease without esophagitis: Secondary | ICD-10-CM | POA: Diagnosis not present

## 2019-09-18 DIAGNOSIS — Z23 Encounter for immunization: Secondary | ICD-10-CM | POA: Diagnosis not present

## 2019-09-18 DIAGNOSIS — I10 Essential (primary) hypertension: Secondary | ICD-10-CM | POA: Diagnosis not present

## 2019-09-18 DIAGNOSIS — R7309 Other abnormal glucose: Secondary | ICD-10-CM

## 2019-09-18 DIAGNOSIS — R221 Localized swelling, mass and lump, neck: Secondary | ICD-10-CM | POA: Diagnosis not present

## 2019-09-18 DIAGNOSIS — E038 Other specified hypothyroidism: Secondary | ICD-10-CM

## 2019-09-18 DIAGNOSIS — E039 Hypothyroidism, unspecified: Secondary | ICD-10-CM | POA: Diagnosis not present

## 2019-09-18 LAB — BASIC METABOLIC PANEL
BUN: 17 mg/dL (ref 6–23)
CO2: 31 mEq/L (ref 19–32)
Calcium: 9.3 mg/dL (ref 8.4–10.5)
Chloride: 100 mEq/L (ref 96–112)
Creatinine, Ser: 0.91 mg/dL (ref 0.40–1.20)
GFR: 61.1 mL/min (ref >60.00)
Glucose, Bld: 94 mg/dL (ref 70–99)
Potassium: 4.2 mEq/L (ref 3.5–5.1)
Sodium: 137 mEq/L (ref 135–145)

## 2019-09-18 LAB — HEMOGLOBIN A1C: Hgb A1c MFr Bld: 6.4 % (ref 4.6–6.5)

## 2019-09-18 LAB — T4, FREE: Free T4: 0.67 ng/dL (ref 0.60–1.60)

## 2019-09-18 LAB — TSH: TSH: 1.74 u[IU]/mL (ref 0.35–4.50)

## 2019-09-18 MED ORDER — TETANUS-DIPHTH-ACELL PERTUSSIS 5-2.5-18.5 LF-MCG/0.5 IM SUSP: 0.5000 mL | Freq: Once | INTRAMUSCULAR | 0 refills | Status: AC

## 2019-09-18 MED ORDER — OMEPRAZOLE 20 MG PO CPDR: 20.0000 mg | DELAYED_RELEASE_CAPSULE | Freq: Every day | ORAL | 0 refills | Status: DC

## 2019-09-18 NOTE — Progress Notes (Signed)
Tommi Rumps, MD Phone: [phone]  Nicole Washington is a 69 y.o. female who presents today for f/u.  GERD:   Reflux symptoms: burning in chest and throat with reflux at night   Abd pain: no   Blood in stool: no  Dysphagia: no   EGD: 3ish years ago in [place]  Medication: has tried pepcid, prevacid, nexium with intermittent benefit.  Acidic foods exacerbate this issue.  No family history of gastric cancer.   Cervical lymph node: Patient notes it still present.  Ultrasound was reassuring and revealed a nonenlarged structurally normal lymph node.  Hypertension: Taking her losartan, amlodipine, carvedilol.  No chest pain or shortness of breath.   Social History   Tobacco Use  Smoking Status Former Smoker  . Quit date: 12/20/1989  . Years since quitting: 29.7  Smokeless Tobacco Never Used     ROS see history of present illness  Objective  Physical Exam Vitals:   09/18/19 1320  BP: 121/80  Pulse: 88  Temp: 98.6 F (37 C)  SpO2: 97%    BP Readings from Last 3 Encounters:  09/18/19 121/80  07/28/19 118/70  05/12/19 110/70   Wt Readings from Last 3 Encounters:  09/18/19 172 lb (78 kg)  07/28/19 170 lb (77.1 kg)  05/12/19 169 lb (76.7 kg)    Physical Exam Constitutional:      General: She is not in acute distress.    Appearance: She is not diaphoretic.  Neck:     Comments: Normal size lymph node left posterior cervical chain nontender and freely mobile Cardiovascular:     Rate and Rhythm: Normal rate and regular rhythm.     Heart sounds: Normal heart sounds.  Pulmonary:     Effort: Pulmonary effort is normal.     Breath sounds: Normal breath sounds.  Abdominal:     General: Bowel sounds are normal. There is no distension.     Palpations: Abdomen is soft.     Tenderness: There is no abdominal tenderness. There is no guarding or rebound.  Skin:    General: Skin is warm and dry.  Neurological:     Mental Status: She is alert.      Assessment/Plan:  Please see individual problem list.  Hypertension Adequate control.  Continue current regimen.  Check labs today.  Subclinical hypothyroidism History of abnormal free T4 values.  Recheck labs today.  Lump in neck Benign-appearing on ultrasound.  No enlargement compared to previously.  She will monitor.  Elevated glucose Check A1c.  GERD (gastroesophageal reflux disease) Chronic issues with this.  Advised to avoid foods that contribute.  Discussed minimizing her coffee intake.  She will take Prilosec 30 minutes prior to breakfast for 1 month and then will see how she is doing.   Health Maintenance: Patient to get her tetanus vaccine from the pharmacy.  Orders Placed This Encounter  Procedures  . Pneumococcal polysaccharide vaccine 23-valent greater than or equal to 2yo subcutaneous/IM  . Basic Metabolic Panel (BMET)  . HgB A1c  . TSH  . T4, free    Meds ordered this encounter  Medications  . Tdap (BOOSTRIX) 5-2.5-18.5 LF-MCG/0.5 injection    Sig: Inject 0.5 mLs into the muscle once for 1 dose.    Dispense:  0.5 mL    Refill:  0  . omeprazole (PRILOSEC) 20 MG capsule    Sig: Take 1 capsule (20 mg total) by mouth daily.    Dispense:  30 capsule    Refill:  0  This visit occurred during the SARS-CoV-2 public health emergency.  Safety protocols were in place, including screening questions prior to the visit, additional usage of staff PPE, and extensive cleaning of exam room while observing appropriate contact time as indicated for disinfecting solutions.    Nicole Platner, MD [facility]  

## 2019-09-18 NOTE — Assessment & Plan Note (Signed)
Benign-appearing on ultrasound.  No enlargement compared to previously.  She will monitor.

## 2019-09-18 NOTE — Assessment & Plan Note (Signed)
Chronic issues with this.  Advised to avoid foods that contribute.  Discussed minimizing her coffee intake.  She will take Prilosec 30 minutes prior to breakfast for 1 month and then will see how she is doing.

## 2019-09-18 NOTE — Assessment & Plan Note (Signed)
History of abnormal free T4 values.  Recheck labs today.

## 2019-09-18 NOTE — Assessment & Plan Note (Signed)
Check A1c. 

## 2019-09-18 NOTE — Patient Instructions (Signed)
Nice to see you. Please try the Prilosec for your reflux. We will get lab work today and contact you with the results.

## 2019-09-18 NOTE — Assessment & Plan Note (Signed)
Adequate control.  Continue current regimen.  Check labs today. 

## 2019-09-19 ENCOUNTER — Telehealth: Payer: Self-pay

## 2019-09-20 NOTE — Telephone Encounter (Signed)
Noted.  She likely had a local site reaction.  She can use ice and Aleve over-the-counter as needed.  If not improving she needs to let us know.

## 2019-09-21 ENCOUNTER — Other Ambulatory Visit: Payer: Self-pay | Admitting: Family Medicine

## 2019-09-25 NOTE — Telephone Encounter (Signed)
Spoke with patient and her arm is doing much better.

## 2019-10-04 ENCOUNTER — Other Ambulatory Visit: Payer: Self-pay | Admitting: Family Medicine

## 2019-10-04 DIAGNOSIS — F5105 Insomnia due to other mental disorder: Secondary | ICD-10-CM | POA: Diagnosis not present

## 2019-10-04 DIAGNOSIS — F4312 Post-traumatic stress disorder, chronic: Secondary | ICD-10-CM | POA: Diagnosis not present

## 2019-10-04 DIAGNOSIS — F411 Generalized anxiety disorder: Secondary | ICD-10-CM | POA: Diagnosis not present

## 2019-10-04 DIAGNOSIS — F39 Unspecified mood [affective] disorder: Secondary | ICD-10-CM | POA: Diagnosis not present

## 2019-10-09 DIAGNOSIS — R0683 Snoring: Secondary | ICD-10-CM | POA: Diagnosis not present

## 2019-10-09 DIAGNOSIS — G4700 Insomnia, unspecified: Secondary | ICD-10-CM | POA: Diagnosis not present

## 2019-10-11 ENCOUNTER — Other Ambulatory Visit: Payer: Self-pay | Admitting: Family Medicine

## 2019-10-12 ENCOUNTER — Telehealth: Payer: Self-pay | Admitting: Family Medicine

## 2019-10-12 NOTE — Telephone Encounter (Signed)
Patient called to let office know she is changing her pharmacy to [facility] in [place].

## 2019-10-12 NOTE — Telephone Encounter (Signed)
Changed the pharmacy for the patient to [facility].  Rondi Ivy,cma

## 2019-10-13 MED ORDER — CLONAZEPAM 0.5 MG PO TABS: 1.0000 mg | ORAL_TABLET | Freq: Every day | ORAL | 0 refills | Status: DC | PRN

## 2019-10-13 NOTE — Telephone Encounter (Signed)
Sent to pharmacy.  Please contact [facility] and cancel the prescription that was sent to them.

## 2019-10-13 NOTE — Telephone Encounter (Signed)
Pt needs to the clonazePAM (KLONOPIN) 0.5 MG tablet sent to [facility]

## 2019-10-16 ENCOUNTER — Other Ambulatory Visit: Payer: Self-pay | Admitting: Family Medicine

## 2019-10-16 DIAGNOSIS — G20 Parkinson's disease: Secondary | ICD-10-CM | POA: Diagnosis not present

## 2019-10-16 DIAGNOSIS — R202 Paresthesia of skin: Secondary | ICD-10-CM | POA: Diagnosis not present

## 2019-10-17 ENCOUNTER — Ambulatory Visit: Payer: Medicare Other

## 2019-10-31 DIAGNOSIS — F39 Unspecified mood [affective] disorder: Secondary | ICD-10-CM | POA: Diagnosis not present

## 2019-10-31 DIAGNOSIS — F411 Generalized anxiety disorder: Secondary | ICD-10-CM | POA: Diagnosis not present

## 2019-10-31 DIAGNOSIS — F4312 Post-traumatic stress disorder, chronic: Secondary | ICD-10-CM | POA: Diagnosis not present

## 2019-10-31 DIAGNOSIS — F5105 Insomnia due to other mental disorder: Secondary | ICD-10-CM | POA: Diagnosis not present

## 2019-11-01 ENCOUNTER — Ambulatory Visit: Payer: Medicare Other | Attending: Otolaryngology

## 2019-11-01 ENCOUNTER — Ambulatory Visit (INDEPENDENT_AMBULATORY_CARE_PROVIDER_SITE_OTHER): Payer: Medicare Other | Admitting: Family Medicine

## 2019-11-01 ENCOUNTER — Other Ambulatory Visit: Payer: Self-pay

## 2019-11-01 ENCOUNTER — Encounter: Payer: Self-pay | Admitting: Family Medicine

## 2019-11-01 VITALS — BP 120/72 | HR 86 | Temp 98.2°F | Ht 62.0 in | Wt 177.0 lb

## 2019-11-01 DIAGNOSIS — G4733 Obstructive sleep apnea (adult) (pediatric): Secondary | ICD-10-CM | POA: Insufficient documentation

## 2019-11-01 DIAGNOSIS — Z23 Encounter for immunization: Secondary | ICD-10-CM

## 2019-11-01 DIAGNOSIS — G20 Parkinson's disease: Secondary | ICD-10-CM

## 2019-11-01 DIAGNOSIS — R251 Tremor, unspecified: Secondary | ICD-10-CM | POA: Insufficient documentation

## 2019-11-01 DIAGNOSIS — K219 Gastro-esophageal reflux disease without esophagitis: Secondary | ICD-10-CM | POA: Diagnosis not present

## 2019-11-01 NOTE — Progress Notes (Signed)
  Tommi Rumps, MD Phone: [phone]  Nicole Washington is a 69 y.o. female who presents today for f/u.  GERD:   Reflux symptoms: burping since she started back drinking coffee   Abd pain: no   Blood in stool: no  Dysphagia: no   EGD: 3 years ago in [place]  Medication: omeprazole Patient had minimal if any symptoms while off of coffee and on omeprazole.  Parkinsonism: Diagnosed with this through neurology.  She has been on Sinemet and Lyrica.  She just started on the Sinemet.   Social History   Tobacco Use  Smoking Status Former Smoker  . Quit date: 12/20/1989  . Years since quitting: 29.8  Smokeless Tobacco Never Used     ROS see history of present illness  Objective  Physical Exam Vitals:   11/01/19 1558  BP: 120/72  Pulse: 86  Temp: 98.2 F (36.8 C)  SpO2: 95%    BP Readings from Last 3 Encounters:  11/01/19 120/72  09/18/19 121/80  07/28/19 118/70   Wt Readings from Last 3 Encounters:  11/01/19 177 lb (80.3 kg)  09/18/19 172 lb (78 kg)  07/28/19 170 lb (77.1 kg)    Physical Exam Abdominal:     General: Bowel sounds are normal. There is no distension.     Palpations: Abdomen is soft.     Tenderness: There is no abdominal tenderness. There is no guarding or rebound.      Assessment/Plan: Please see individual problem list.  GERD (gastroesophageal reflux disease) Well-controlled on omeprazole.  She will continue this.  Advised to discontinue coffee intake as it is exacerbating symptoms.  Discussed small long-term risk of poor absorption of certain vitamins, risk of infection, and risk to her kidneys with use of chronic PPIs.  Discussed that the benefit of the PPI likely outweighs the risk to her at this time.  Parkinsonism [facility]) She will continue management through neurology.   Orders Placed This Encounter  Procedures  . Flu Vaccine QUAD High Dose(Fluad)    No orders of the defined types were placed in this encounter.   Nicole Washington was seen  today for follow-up.  Diagnoses and all orders for this visit:  Need for immunization against influenza -     Flu Vaccine QUAD High Dose(Fluad)  Gastroesophageal reflux disease, unspecified whether esophagitis present  Parkinsonism, unspecified Parkinsonism type ([place])     This visit occurred during the SARS-CoV-2 public health emergency.  Safety protocols were in place, including screening questions prior to the visit, additional usage of staff PPE, and extensive cleaning of exam room while observing appropriate contact time as indicated for disinfecting solutions.    Tommi Rumps, MD [place]

## 2019-11-01 NOTE — Assessment & Plan Note (Signed)
Well-controlled on omeprazole.  She will continue this.  Advised to discontinue coffee intake as it is exacerbating symptoms.  Discussed small long-term risk of poor absorption of certain vitamins, risk of infection, and risk to her kidneys with use of chronic PPIs.  Discussed that the benefit of the PPI likely outweighs the risk to her at this time.

## 2019-11-01 NOTE — Patient Instructions (Signed)
Nice to see you. Please continue the omeprazole. Please discontinue coffee intake.

## 2019-11-01 NOTE — Assessment & Plan Note (Signed)
She will continue management through neurology.

## 2019-11-20 ENCOUNTER — Ambulatory Visit: Payer: Medicare Other | Attending: Otolaryngology

## 2019-11-20 DIAGNOSIS — G4733 Obstructive sleep apnea (adult) (pediatric): Secondary | ICD-10-CM | POA: Insufficient documentation

## 2019-11-21 ENCOUNTER — Telehealth: Payer: Self-pay | Admitting: Family Medicine

## 2019-11-21 NOTE — Telephone Encounter (Signed)
Patient called in need refill for clonazePAM (KLONOPIN) 0.5 MG tablet

## 2019-11-21 NOTE — Telephone Encounter (Signed)
Patient has not. She stated she will call [facility].

## 2019-11-21 NOTE — Telephone Encounter (Signed)
Has she checked with the pharmacy?  It appears that there may be a refill available for her.  If there is not a refill I'm happy to refill it.

## 2019-11-24 ENCOUNTER — Other Ambulatory Visit: Payer: Self-pay

## 2019-11-24 ENCOUNTER — Telehealth: Payer: Self-pay | Admitting: Family Medicine

## 2019-11-24 DIAGNOSIS — Z23 Encounter for immunization: Secondary | ICD-10-CM | POA: Diagnosis not present

## 2019-11-24 NOTE — Telephone Encounter (Signed)
Patient called in stated she going out of town need refill on clonazePAM (KLONOPIN) 0.5 MG tablet

## 2019-11-27 MED ORDER — CLONAZEPAM 0.5 MG PO TABS
1.0000 mg | ORAL_TABLET | Freq: Every day | ORAL | 0 refills | Status: DC | PRN
Start: 2019-11-27 — End: 2020-01-03

## 2019-11-27 NOTE — Telephone Encounter (Signed)
Sent to pharmacy 

## 2019-11-27 NOTE — Telephone Encounter (Signed)
Pharmacy does not have refills nor RX for patient. Please advise.

## 2019-12-26 ENCOUNTER — Other Ambulatory Visit: Payer: Self-pay | Admitting: Family Medicine

## 2019-12-31 ENCOUNTER — Other Ambulatory Visit: Payer: Self-pay | Admitting: Family Medicine

## 2020-01-01 ENCOUNTER — Other Ambulatory Visit: Payer: Self-pay | Admitting: Family Medicine

## 2020-01-01 NOTE — Telephone Encounter (Signed)
Patient call wanted another medication filled for carvedilol (COREG) 3.125 MG tablet clonazePAM (KLONOPIN) 0.5 MG tablet

## 2020-01-01 NOTE — Telephone Encounter (Signed)
Patient called in need refill for losartan (COZAAR) 25 MG tablet ,amLODipine (NORVASC) 5 MG tablet she stated that she is going out of town for two weeks ana need her medication before she  Leaves Tuesday 01-02-2020

## 2020-01-03 NOTE — Telephone Encounter (Signed)
She should have a carvedilol refill at the pharmacy.  She should check with them to get this filled.

## 2020-02-02 DIAGNOSIS — F5105 Insomnia due to other mental disorder: Secondary | ICD-10-CM | POA: Diagnosis not present

## 2020-02-02 DIAGNOSIS — F4312 Post-traumatic stress disorder, chronic: Secondary | ICD-10-CM | POA: Diagnosis not present

## 2020-02-02 DIAGNOSIS — F39 Unspecified mood [affective] disorder: Secondary | ICD-10-CM | POA: Diagnosis not present

## 2020-02-02 DIAGNOSIS — F411 Generalized anxiety disorder: Secondary | ICD-10-CM | POA: Diagnosis not present

## 2020-02-06 ENCOUNTER — Other Ambulatory Visit: Payer: Self-pay | Admitting: Family Medicine

## 2020-02-29 DIAGNOSIS — J301 Allergic rhinitis due to pollen: Secondary | ICD-10-CM | POA: Diagnosis not present

## 2020-02-29 DIAGNOSIS — G4733 Obstructive sleep apnea (adult) (pediatric): Secondary | ICD-10-CM | POA: Diagnosis not present

## 2020-03-01 DIAGNOSIS — F419 Anxiety disorder, unspecified: Secondary | ICD-10-CM | POA: Insufficient documentation

## 2020-03-05 DIAGNOSIS — N949 Unspecified condition associated with female genital organs and menstrual cycle: Secondary | ICD-10-CM | POA: Diagnosis not present

## 2020-03-05 DIAGNOSIS — F5222 Female sexual arousal disorder: Secondary | ICD-10-CM | POA: Diagnosis not present

## 2020-03-15 DIAGNOSIS — M797 Fibromyalgia: Secondary | ICD-10-CM | POA: Diagnosis not present

## 2020-03-15 DIAGNOSIS — R251 Tremor, unspecified: Secondary | ICD-10-CM | POA: Diagnosis not present

## 2020-03-15 DIAGNOSIS — K146 Glossodynia: Secondary | ICD-10-CM | POA: Diagnosis not present

## 2020-03-15 DIAGNOSIS — R202 Paresthesia of skin: Secondary | ICD-10-CM | POA: Diagnosis not present

## 2020-03-15 DIAGNOSIS — F4310 Post-traumatic stress disorder, unspecified: Secondary | ICD-10-CM | POA: Diagnosis not present

## 2020-03-15 DIAGNOSIS — F419 Anxiety disorder, unspecified: Secondary | ICD-10-CM | POA: Diagnosis not present

## 2020-03-15 DIAGNOSIS — F320 Major depressive disorder, single episode, mild: Secondary | ICD-10-CM | POA: Diagnosis not present

## 2020-03-24 ENCOUNTER — Other Ambulatory Visit: Payer: Self-pay | Admitting: Family Medicine

## 2020-03-28 ENCOUNTER — Other Ambulatory Visit: Payer: Self-pay | Admitting: Family Medicine

## 2020-04-30 ENCOUNTER — Ambulatory Visit (INDEPENDENT_AMBULATORY_CARE_PROVIDER_SITE_OTHER): Payer: Medicare Other | Admitting: Family Medicine

## 2020-04-30 ENCOUNTER — Encounter: Payer: Self-pay | Admitting: Family Medicine

## 2020-04-30 ENCOUNTER — Other Ambulatory Visit: Payer: Self-pay

## 2020-04-30 VITALS — BP 110/70 | HR 89 | Temp 98.3°F | Ht 62.0 in | Wt 189.8 lb

## 2020-04-30 DIAGNOSIS — E669 Obesity, unspecified: Secondary | ICD-10-CM

## 2020-04-30 DIAGNOSIS — K146 Glossodynia: Secondary | ICD-10-CM

## 2020-04-30 DIAGNOSIS — I10 Essential (primary) hypertension: Secondary | ICD-10-CM | POA: Diagnosis not present

## 2020-04-30 DIAGNOSIS — E038 Other specified hypothyroidism: Secondary | ICD-10-CM

## 2020-04-30 MED ORDER — TETANUS-DIPHTH-ACELL PERTUSSIS 5-2.5-18.5 LF-MCG/0.5 IM SUSP: 0.5000 mL | Freq: Once | INTRAMUSCULAR | 0 refills | Status: AC

## 2020-04-30 NOTE — Patient Instructions (Signed)
Nice to see you. Your calorie goal per day for 1 Lb of weight loss per week is 1329 calories with 2-3 days of exercise. Please do not drop below 1200 calories per day.  We will get labs today.

## 2020-04-30 NOTE — Progress Notes (Signed)
Tommi Rumps, MD Phone: [phone]  Nicole Washington is a 70 y.o. female who presents today for follow-up.  Hypertension: Not checking at home.  Taking amlodipine, carvedilol, losartan.  No chest pain, shortness with, or edema.  Burning mouth syndrome: Clonazepam helps significantly.  No drowsiness with this.  Obesity: Patient notes 19 pound weight gain over the last several months.  She has not been exercising.  She did have some dietary indiscretions around Christmas though otherwise eats a healthy diet with yogurt, protein shakes, fruit, vegetables, and grilled meats.  She has tried to limit bread and potatoes as well as sugar.  She wonders about medication for weight loss.  Social History   Tobacco Use  Smoking Status Former Smoker  . Quit date: 12/20/1989  . Years since quitting: 30.3  Smokeless Tobacco Never Used    Current Outpatient Medications on File Prior to Visit  Medication Sig Dispense Refill  . amLODipine (NORVASC) 5 MG tablet TAKE 1 TABLET BY MOUTH EVERY DAY 90 tablet 1  . aspirin EC 81 MG tablet Take 81 mg by mouth daily.    Marland Kitchen atorvastatin (LIPITOR) 40 MG tablet TAKE 1 TABLET BY MOUTH EVERY DAY 90 tablet 1  . carbidopa-levodopa (SINEMET IR) 25-100 MG tablet Take by mouth.    . carvedilol (COREG) 3.125 MG tablet TAKE 1 TABLET (3.125 MG TOTAL) BY MOUTH 2 (TWO) TIMES DAILY WITH A MEAL. 180 tablet 1  . clonazePAM (KLONOPIN) 0.5 MG tablet TAKE 2 TABLETS BY MOUTH DAILY AS NEEDED FOR ANXIETY 60 tablet 1  . DULoxetine (CYMBALTA) 60 MG capsule Take 60 mg by mouth 2 (two) times daily.    Marland Kitchen lamoTRIgine (LAMICTAL) 25 MG tablet Take 150 mg by mouth 2 (two) times daily.    Marland Kitchen losartan (COZAAR) 25 MG tablet TAKE 1 TABLET BY MOUTH EVERY DAY 90 tablet 1  . omeprazole (PRILOSEC) 20 MG capsule TAKE 1 CAPSULE BY MOUTH EVERY DAY 90 capsule 1  . pregabalin (LYRICA) 50 MG capsule Take 150 mg by mouth 2 (two) times daily.    . QUEtiapine (SEROQUEL) 25 MG tablet Take 50 mg by mouth at  bedtime.     . sertraline (ZOLOFT) 50 MG tablet Take 50 mg by mouth daily.     No current facility-administered medications on file prior to visit.     ROS see history of present illness  Objective  Physical Exam Vitals:   04/30/20 1236  BP: 110/70  Pulse: 89  Temp: 98.3 F (36.8 C)  SpO2: 96%    BP Readings from Last 3 Encounters:  04/30/20 110/70  11/01/19 120/72  09/18/19 121/80   Wt Readings from Last 3 Encounters:  04/30/20 189 lb 12.8 oz (86.1 kg)  11/01/19 177 lb (80.3 kg)  09/18/19 172 lb (78 kg)    Physical Exam Constitutional:      General: She is not in acute distress.    Appearance: She is not diaphoretic.  Cardiovascular:     Rate and Rhythm: Normal rate and regular rhythm.     Heart sounds: Normal heart sounds.  Pulmonary:     Effort: Pulmonary effort is normal.     Breath sounds: Normal breath sounds.  Skin:    General: Skin is warm and dry.  Neurological:     Mental Status: She is alert.      Assessment/Plan: Please see individual problem list.  Problem List Items Addressed This Visit    Burning mouth syndrome    Chronic and stable.  She  can continue clonazepam 1 mg daily as needed.      Hypertension - Primary    Adequate control.  She will continue amlodipine 5 mg once daily, losartan 25 mg daily, and carvedilol 3.125 mg twice daily.  Check BMP.      Relevant Orders   Basic Metabolic Panel (BMET)   Obesity (BMI 30.0-34.9)    Patient has had recent weight gain.  Discussed that this may be related to her medications that she takes from her psychiatrist.  I encouraged her to discuss that with them particularly if she cannot lose weight with dietary changes and exercise.  Her diet seems to be generally healthy.  I did give her a calorie goal of 1328 cal/day.  I advised that she should not decrease her calorie intake lower than 1200 cal/day.  Discussed increasing exercise to 2 days a week to start with and then building up from there.  I  advised that I would not prescribe medication for weight loss until she has had a 15-month trial of diet and exercise consistently.  We will check her thyroid function to ensure that that is not contributing to her weight gain.      Subclinical hypothyroidism   Relevant Orders   TSH       Health Maintenance: The patient will get her tetanus vaccine and her Shingrix vaccine at the pharmacy.     This visit occurred during the SARS-CoV-2 public health emergency.  Safety protocols were in place, including screening questions prior to the visit, additional usage of staff PPE, and extensive cleaning of exam room while observing appropriate contact time as indicated for disinfecting solutions.    Tommi Rumps, MD [place]

## 2020-04-30 NOTE — Assessment & Plan Note (Signed)
Patient has had recent weight gain.  Discussed that this may be related to her medications that she takes from her psychiatrist.  I encouraged her to discuss that with them particularly if she cannot lose weight with dietary changes and exercise.  Her diet seems to be generally healthy.  I did give her a calorie goal of 1328 cal/day.  I advised that she should not decrease her calorie intake lower than 1200 cal/day.  Discussed increasing exercise to 2 days a week to start with and then building up from there.  I advised that I would not prescribe medication for weight loss until she has had a 68-month trial of diet and exercise consistently.  We will check her thyroid function to ensure that that is not contributing to her weight gain.

## 2020-04-30 NOTE — Assessment & Plan Note (Signed)
Chronic and stable.  She can continue clonazepam 1 mg daily as needed.

## 2020-04-30 NOTE — Assessment & Plan Note (Signed)
Adequate control.  She will continue amlodipine 5 mg once daily, losartan 25 mg daily, and carvedilol 3.125 mg twice daily.  Check BMP.

## 2020-05-01 DIAGNOSIS — F411 Generalized anxiety disorder: Secondary | ICD-10-CM | POA: Diagnosis not present

## 2020-05-01 DIAGNOSIS — F5105 Insomnia due to other mental disorder: Secondary | ICD-10-CM | POA: Diagnosis not present

## 2020-05-01 DIAGNOSIS — F39 Unspecified mood [affective] disorder: Secondary | ICD-10-CM | POA: Diagnosis not present

## 2020-05-01 DIAGNOSIS — F4312 Post-traumatic stress disorder, chronic: Secondary | ICD-10-CM | POA: Diagnosis not present

## 2020-05-01 LAB — BASIC METABOLIC PANEL
BUN: 19 mg/dL (ref 6–23)
CO2: 26 mEq/L (ref 19–32)
Calcium: 9.2 mg/dL (ref 8.4–10.5)
Chloride: 100 mEq/L (ref 96–112)
Creatinine, Ser: 0.91 mg/dL (ref 0.40–1.20)
GFR: 63.83 mL/min (ref >60.00)
Glucose, Bld: 92 mg/dL (ref 70–99)
Potassium: 4.4 mEq/L (ref 3.5–5.1)
Sodium: 139 mEq/L (ref 135–145)

## 2020-05-01 LAB — TSH: TSH: 2.31 u[IU]/mL (ref 0.35–4.50)

## 2020-05-06 DIAGNOSIS — F4310 Post-traumatic stress disorder, unspecified: Secondary | ICD-10-CM | POA: Diagnosis not present

## 2020-05-06 DIAGNOSIS — F419 Anxiety disorder, unspecified: Secondary | ICD-10-CM | POA: Diagnosis not present

## 2020-05-06 DIAGNOSIS — G894 Chronic pain syndrome: Secondary | ICD-10-CM | POA: Diagnosis not present

## 2020-05-07 ENCOUNTER — Other Ambulatory Visit: Payer: Self-pay | Admitting: Family Medicine

## 2020-05-27 DIAGNOSIS — M1712 Unilateral primary osteoarthritis, left knee: Secondary | ICD-10-CM | POA: Diagnosis not present

## 2020-06-08 ENCOUNTER — Other Ambulatory Visit: Payer: Self-pay | Admitting: Family Medicine

## 2020-06-10 NOTE — Telephone Encounter (Signed)
RX Refill:Klonopin Last Seen:04-30-20 Last ordered:03-26-20

## 2020-06-20 ENCOUNTER — Other Ambulatory Visit: Payer: Self-pay | Admitting: Family Medicine

## 2020-07-10 DIAGNOSIS — H35373 Puckering of macula, bilateral: Secondary | ICD-10-CM | POA: Diagnosis not present

## 2020-07-10 DIAGNOSIS — H2513 Age-related nuclear cataract, bilateral: Secondary | ICD-10-CM | POA: Diagnosis not present

## 2020-07-16 DIAGNOSIS — M1712 Unilateral primary osteoarthritis, left knee: Secondary | ICD-10-CM | POA: Diagnosis not present

## 2020-07-17 ENCOUNTER — Ambulatory Visit (INDEPENDENT_AMBULATORY_CARE_PROVIDER_SITE_OTHER): Payer: Medicare Other

## 2020-07-17 VITALS — Ht 62.0 in | Wt 189.0 lb

## 2020-07-17 DIAGNOSIS — Z0000 Encounter for general adult medical examination without abnormal findings: Secondary | ICD-10-CM

## 2020-07-17 IMAGING — MR MR CERVICAL SPINE W/O CM
5 series · 39 of 48 positions shown · non-contrast
Comparison: Cervical spine x-rays dated January 17, 2019.

CLINICAL DATA: Neck tingling.

EXAM:
MRI CERVICAL SPINE WITHOUT CONTRAST
TECHNIQUE: Multiplanar, multisequence MR imaging of the cervical spine was
performed. No intravenous contrast was administered.

[Series 9: T2 · sagittal · 3.0mm · 0.62mm/px · 8 of 15 slices shown (1 of 2)]
[im 1/15]
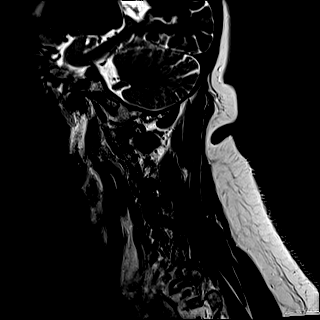
[im 3/15]
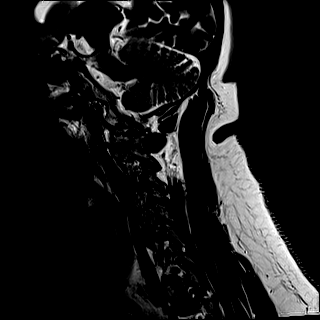
[im 5/15]
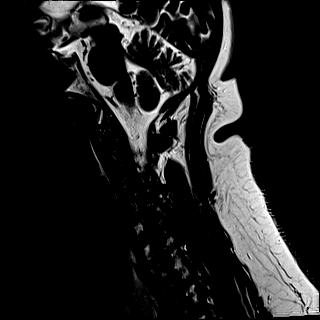
[im 7/15]
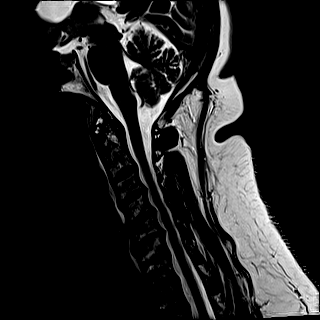
[im 9/15]
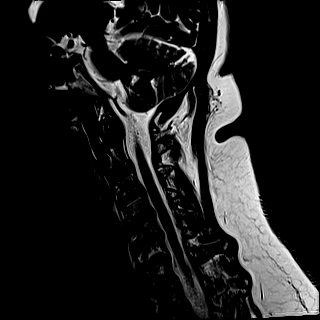
[im 11/15]
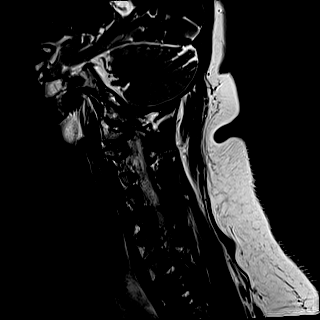
[im 13/15]
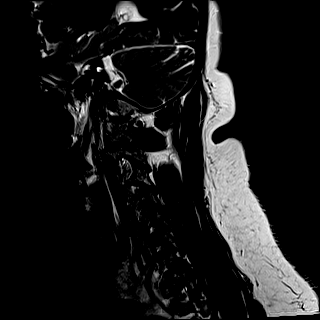
[im 15/15]
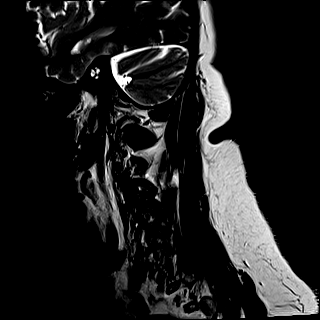

[Series 10: FLAIR · sagittal · 3.0mm · 0.78mm/px · 7 of 15 slices shown]
[im 1/15]
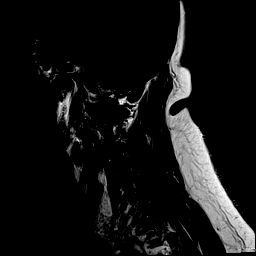
[im 3/15]
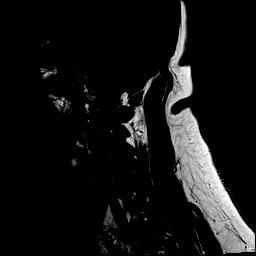
[im 5/15]
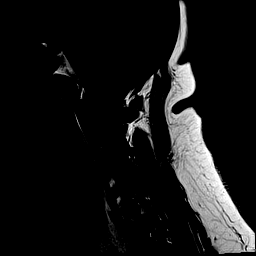
[im 8/15]
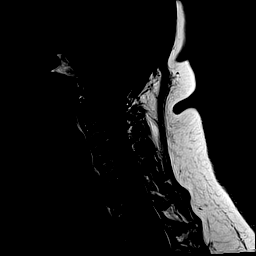
[im 10/15]
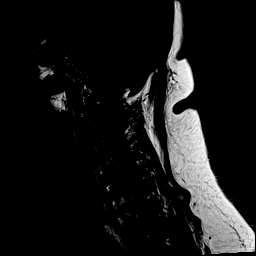
[im 12/15]
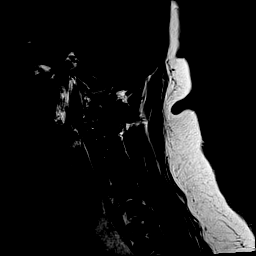
[im 15/15]
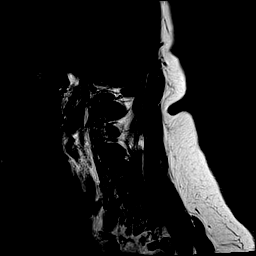

[Series 11: STIR · sagittal · 3.0mm · 0.62mm/px · 7 of 15 slices shown]
[im 1/15]
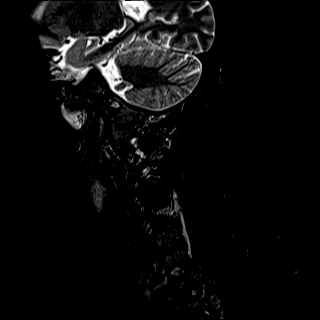
[im 3/15]
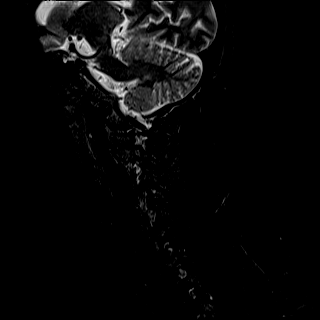
[im 5/15]
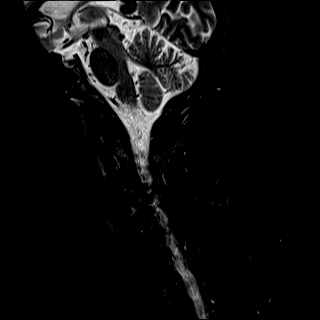
[im 8/15]
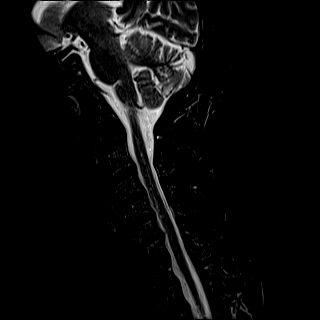
[im 10/15]
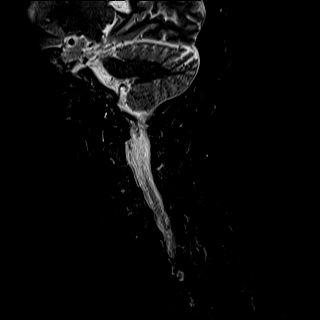
[im 12/15]
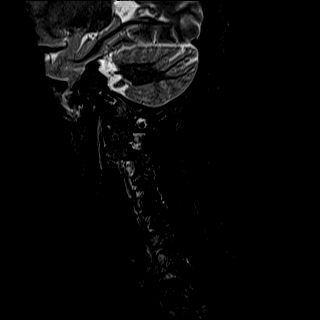
[im 15/15]
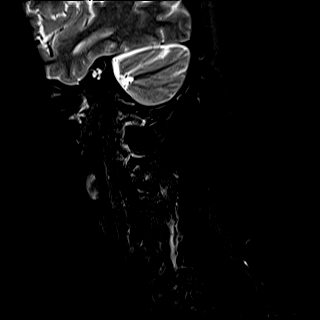

[Series 12: T2 · axial · 3.0mm · 0.70mm/px · z∈[-221,-139]mm · 9 of 26 slices shown (2 of 2)]
[im 1/26]
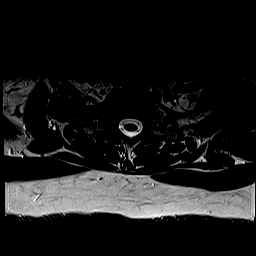
[im 5/26]
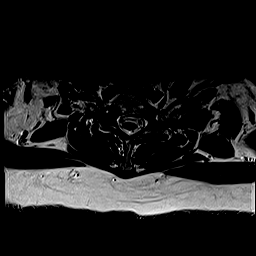
[im 7/26]
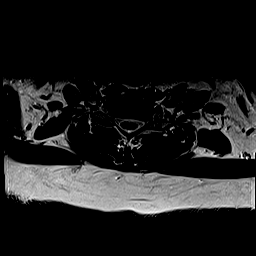
[im 12/26]
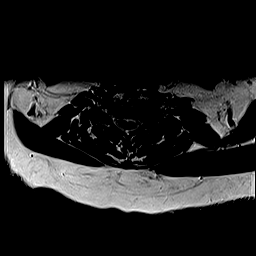
[im 14/26]
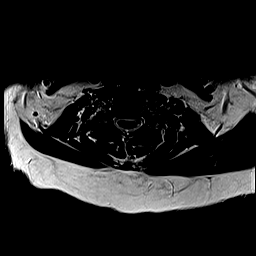
[im 19/26]
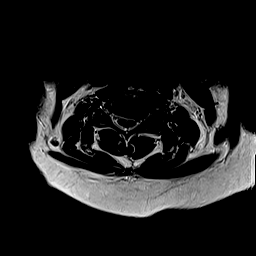
[im 21/26]
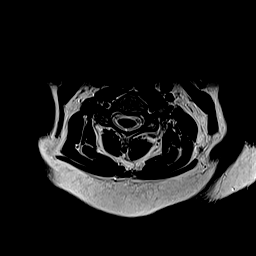
[im 23/26]
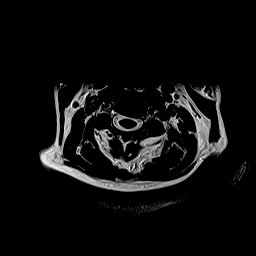
[im 26/26]
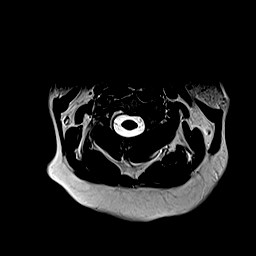

[Series 13: ax mpgr · axial · 3.0mm · 0.35mm/px · z∈[-226,-134]mm · 8 of 29 slices shown]
[im 1/29]
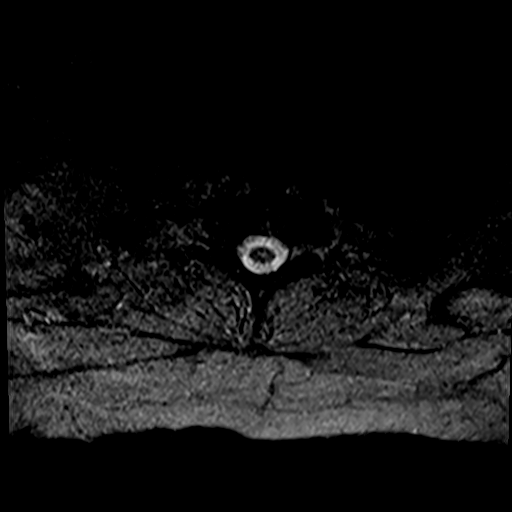
[im 5/29]
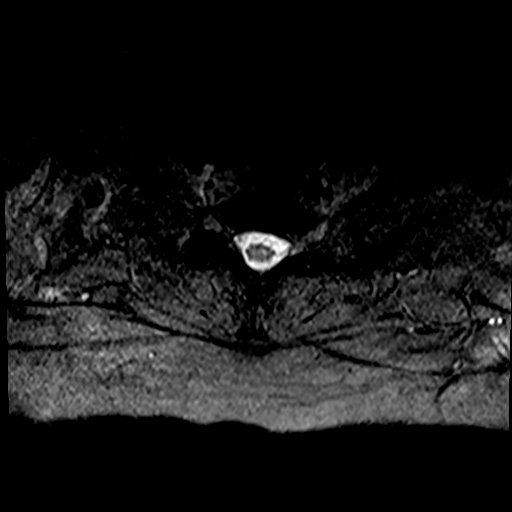
[im 9/29]
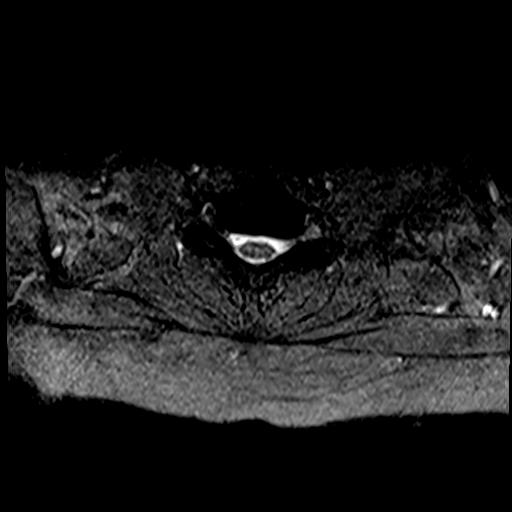
[im 13/29]
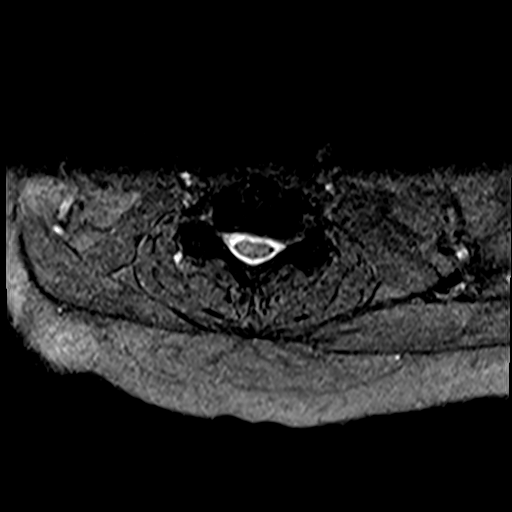
[im 16/29]
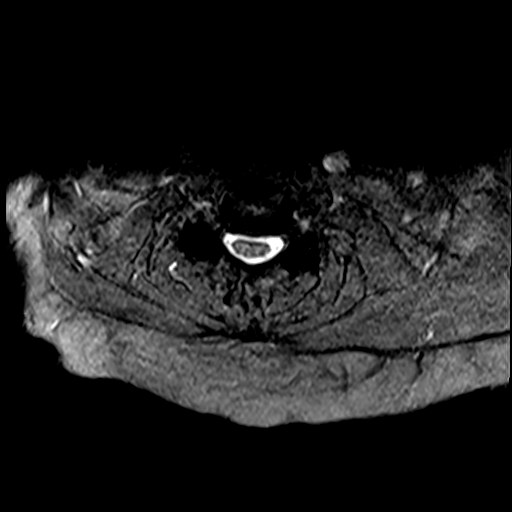
[im 20/29]
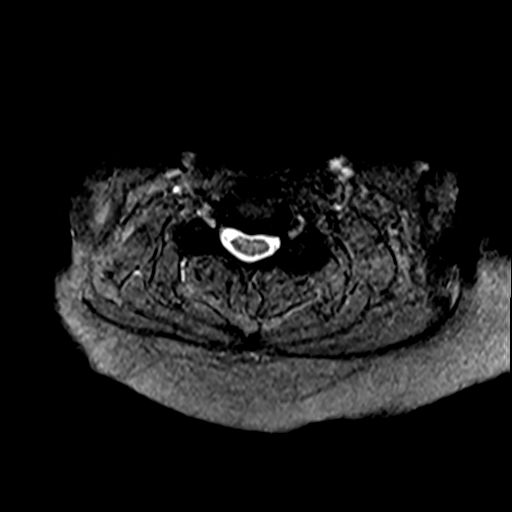
[im 24/29]
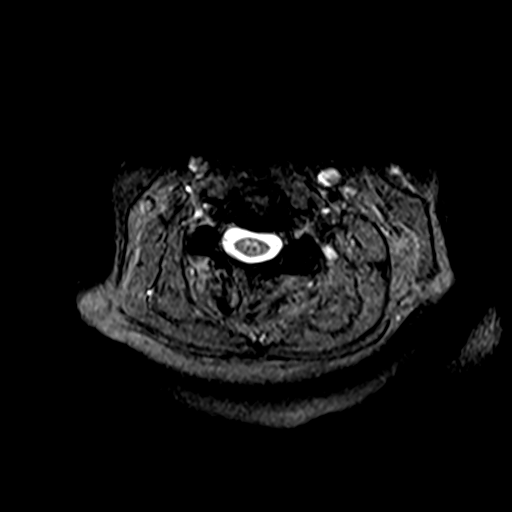
[im 29/29]
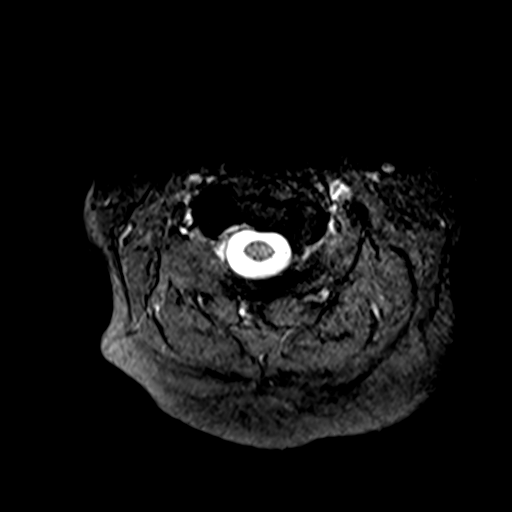

[39 of 48 positions shown; findings below may reference images not displayed]

FINDINGS: Alignment: Straightening of the normal cervical lordosis.

Vertebrae: No fracture, evidence of discitis, or bone lesion.

Cord: Normal signal and morphology.

Posterior Fossa, vertebral arteries, paraspinal tissues: Negative.

Disc levels:

C2-C3:  Mild disc bulging and left facet arthropathy.  No stenosis.

C3-C4: Mild disc bulging. Moderate left and mild right facet
uncovertebral hypertrophy. Moderate to severe left and mild right
neuroforaminal stenosis. No spinal canal stenosis.

C4-C5: Small circumferential disc osteophyte complex. Mild bilateral
facet uncovertebral hypertrophy. Mild to moderate bilateral
neuroforaminal stenosis. No spinal canal stenosis.

C5-C6: Small circumferential disc osteophyte complex. Moderate
bilateral uncovertebral hypertrophy. Moderate bilateral
neuroforaminal stenosis. No spinal canal stenosis.

C6-C7: Small circumferential disc osteophyte complex. Mild bilateral
uncovertebral hypertrophy. Mild bilateral neuroforaminal stenosis.
No spinal canal stenosis.

C7-T1: Mild disc bulging. Moderate right facet arthropathy. No
stenosis.
IMPRESSION: 1. Diffuse cervical spondylosis as described above. Multilevel
neuroforaminal stenosis, moderate to severe on the left at C3-C4.

## 2020-07-17 NOTE — Progress Notes (Signed)
Subjective:   Nicole Washington is a 70 y.o. female who presents for Medicare Annual (Subsequent) preventive examination.  Review of Systems    No ROS.  Medicare Wellness Virtual Visit.  Visual/audio telehealth visit, UTA vital signs.   See social history for additional risk factors.  Cardiac Risk Factors include: advanced age (>8men, >13 women)     Objective:    Today's Vitals   07/17/20 1430  Weight: 189 lb (85.7 kg)  Height: 5\' 2"  (1.575 m)   Body mass index is 34.57 kg/m.  Advanced Directives 07/17/2020 10/14/2018  Does Patient Have a Medical Advance Directive? Yes No  Type of Paramedic of [place];Living will -  Does patient want to make changes to medical advance directive? No - Patient declined -  Copy of [place] in Chart? No - copy requested -  Would patient like information on creating a medical advance directive? - No - Patient declined    Current Medications (verified) Outpatient Encounter Medications as of 07/17/2020  Medication Sig  . amLODipine (NORVASC) 5 MG tablet TAKE 1 TABLET BY MOUTH EVERY DAY  . aspirin EC 81 MG tablet Take 81 mg by mouth daily.  Marland Kitchen atorvastatin (LIPITOR) 40 MG tablet TAKE 1 TABLET BY MOUTH EVERY DAY  . carbidopa-levodopa (SINEMET IR) 25-100 MG tablet Take by mouth.  . carvedilol (COREG) 3.125 MG tablet TAKE 1 TABLET (3.125 MG TOTAL) BY MOUTH 2 (TWO) TIMES DAILY WITH A MEAL.  . clonazePAM (KLONOPIN) 0.5 MG tablet TAKE 2 TABLETS BY MOUTH DAILY AS NEEDED FOR ANXIETY  . DULoxetine (CYMBALTA) 60 MG capsule Take 60 mg by mouth 2 (two) times daily.  Marland Kitchen lamoTRIgine (LAMICTAL) 25 MG tablet Take 150 mg by mouth 2 (two) times daily.  Marland Kitchen losartan (COZAAR) 25 MG tablet TAKE 1 TABLET BY MOUTH EVERY DAY  . omeprazole (PRILOSEC) 20 MG capsule TAKE 1 CAPSULE BY MOUTH EVERY DAY  . pregabalin (LYRICA) 50 MG capsule Take 150 mg by mouth 2 (two) times daily.  . QUEtiapine (SEROQUEL) 25 MG tablet Take 50 mg by mouth at  bedtime.   . sertraline (ZOLOFT) 50 MG tablet Take 50 mg by mouth daily.   No facility-administered encounter medications on file as of 07/17/2020.    Allergies (verified) Codeine, Tylenol [acetaminophen], and Pneumovax 23 [pneumococcal vac polyvalent]   History: Past Medical History:  Diagnosis Date  . Burning mouth syndrome   . CAD (coronary artery disease)   . Cancer (HCC)    basal and squamous cell  . Depression   . GERD (gastroesophageal reflux disease)   . Hyperlipidemia   . Hypertension   . Thyroid disease    Past Surgical History:  Procedure Laterality Date  . BREAST BIOPSY Right yrs ago   benign, marker placed  . CESAREAN SECTION     Family History  Problem Relation Age of Onset  . Cerebral aneurysm Mother 27  . COPD Father   . Cancer Brother        ? colon  . Lupus Other   . Heart Problems Paternal Grandfather   . Breast cancer Neg Hx    Social History   Socioeconomic History  . Marital status: Married    Spouse name: Not on file  . Number of children: Not on file  . Years of education: Not on file  . Highest education level: Not on file  Occupational History  . Not on file  Tobacco Use  . Smoking status: Former Smoker  Quit date: 12/20/1989    Years since quitting: 30.5  . Smokeless tobacco: Never Used  Substance and Sexual Activity  . Alcohol use: Yes    Comment: 4 times a year  . Drug use: No  . Sexual activity: Not on file  Other Topics Concern  . Not on file  Social History Narrative  . Not on file   Social Determinants of Health   Financial Resource Strain: Low Risk   . Difficulty of Paying Living Expenses: Not hard at all  Food Insecurity: No Food Insecurity  . Worried About Charity fundraiser in the Last Year: Never true  . Ran Out of Food in the Last Year: Never true  Transportation Needs: No Transportation Needs  . Lack of Transportation (Medical): No  . Lack of Transportation (Non-Medical): No  Physical Activity: Unknown   . Days of Exercise per Week: 0 days  . Minutes of Exercise per Session: Not on file  Stress: No Stress Concern Present  . Feeling of Stress : Not at all  Social Connections: Not on file    Tobacco Counseling Counseling given: Not Answered   Clinical Intake:  Pre-visit preparation completed: Yes        Diabetes: No  How often do you need to have someone help you when you read instructions, pamphlets, or other written materials from your doctor or pharmacy?: 1 - Never  Interpreter Needed?: No      Activities of Daily Living In your present state of health, do you have any difficulty performing the following activities: 07/17/2020  Hearing? N  Vision? N  Difficulty concentrating or making decisions? N  Walking or climbing stairs? Y  Comment Paces self  Dressing or bathing? N  Doing errands, shopping? N  Preparing Food and eating ? N  Using the Toilet? N  In the past six months, have you accidently leaked urine? N  Do you have problems with loss of bowel control? N  Managing your Medications? N  Managing your Finances? N  Housekeeping or managing your Housekeeping? N  Comment Paces self  Some recent data might be hidden    Patient Care Team: Leone Haven, MD as PCP - General (Family Medicine)  Indicate any recent Medical Services you may have received from other than [facility] providers in the past year (date may be approximate).     Assessment:   This is a routine wellness examination for Nicole Washington.  I connected with Zaiyah today by telephone and verified that I am speaking with the correct person using two identifiers. Location patient: home Location provider: work Persons participating in the virtual visit: patient, Marine scientist.    I discussed the limitations, risks, security and privacy concerns of performing an evaluation and management service by telephone and the availability of in person appointments. The patient expressed understanding and verbally consented  to this telephonic visit.    Interactive audio and video telecommunications were attempted between this provider and patient, however failed, due to patient having technical difficulties OR patient did not have access to video capability.  We continued and completed visit with audio only.  Some vital signs may be absent or patient reported.   Hearing/Vision screen  Hearing Screening   125Hz  250Hz  500Hz  1000Hz  2000Hz  3000Hz  4000Hz  6000Hz  8000Hz   Right ear:           Left ear:           Comments: Patient is able to hear conversational tones without difficulty.  No issues reported.  Vision Screening Comments: Wears corrective lenses  Visual acuity not assessed, virtual visit. They have seen their ophthalmologist in the last 12 months.   Dietary issues and exercise activities discussed: Current Exercise Habits: The patient does not participate in regular exercise at present  Regular diet Good water intake  Goals Addressed              This Visit's Progress     Patient Stated   .  Follow up with pcp as needed (pt-stated)   Not on track     Depression Screen PHQ 2/9 Scores 07/17/2020 04/30/2020 09/18/2019 05/12/2019 01/16/2019 10/14/2018 03/11/2018  PHQ - 2 Score 0 0 0 0 0 - 1  Exception Documentation - - - - - (No Data) -    Fall Risk Fall Risk  07/17/2020 04/30/2020 11/01/2019 09/18/2019 05/12/2019  Falls in the past year? 1 1 0 0 0  Number falls in past yr: 1 1 - 0 0  Injury with Fall? 0 1 - - -  Risk for fall due to : History of fall(s) - - - -  Follow up Falls evaluation completed Falls evaluation completed Falls evaluation completed Falls evaluation completed Falls evaluation completed   [place]: Handrails in use when climbing stairs? Yes Home free of loose throw rugs in walkways, pet beds, electrical cords, etc? Yes  Adequate lighting in your home to reduce risk of falls? Yes   ASSISTIVE DEVICES UTILIZED TO PREVENT FALLS: Use of a cane, walker  or w/c? No   TIMED UP AND GO: Was the test performed? No . Virtual visit.   Cognitive Function:  Patient is alert.     6CIT Screen 10/14/2018  What Year? 0 points  What month? 0 points  What time? 0 points  Count back from 20 0 points  Months in reverse 0 points  Repeat phrase 0 points  Total Score 0   Immunizations Immunization History  Administered Date(s) Administered  . Fluad Quad(high Dose 65+) 11/01/2019  . Hep A / Hep B 11/25/2018  . Hepatitis B 12/21/2018  . Influenza, High Dose Seasonal PF 02/02/2017  . Influenza,inj,Quad PF,6+ Mos 11/03/2017  . PFIZER(Purple Top)SARS-COV-2 Vaccination 04/13/2019, 05/09/2019  . Pneumococcal Conjugate-13 11/03/2017  . Pneumococcal Polysaccharide-23 09/18/2019   Health Maintenance Health Maintenance  Topic Date Due  . TETANUS/TDAP  Never done  . Zoster Vaccines- Shingrix (1 of 2) Never done  . COVID-19 Vaccine (3 - Pfizer risk 4-dose series) 06/06/2019  . INFLUENZA VACCINE  09/16/2020  . MAMMOGRAM  07/12/2021  . COLONOSCOPY (Pts 45-53yrs Insurance coverage will need to be confirmed)  10/06/2023  . DEXA SCAN  Completed  . Hepatitis C Screening  Completed  . PNA vac Low Risk Adult  Completed  . HPV VACCINES  Aged Out   Colorectal cancer screening: Type of screening: Colonoscopy. Completed 10/05/13. Repeat every 10 years   Mammogram status: Completed 07/13/19. Repeat every year. Deferred.   Lung Cancer Screening: (Low Dose CT Chest recommended if Age 39-80 years, 30 pack-year currently smoking OR have quit w/in 15years.) does not qualify.   Notes having Shingles and Tdap completed through [employer]. Not on file.  Dental Screening: Recommended annual dental exams for proper oral hygiene.  Community Resource Referral / Chronic Care Management: CRR required this visit?  No   CCM required this visit?  No      Plan:   Keep all routine maintenance appointments.  I have personally reviewed and noted the following  in the patient's chart:   . Medical and social history . Use of alcohol, tobacco or illicit drugs  . Current medications and supplements including opioid prescriptions.  . Functional ability and status . Nutritional status . Physical activity . Advanced directives . List of other physicians . Hospitalizations, surgeries, and ER visits in previous 12 months . Vitals . Screenings to include cognitive, depression, and falls . Referrals and appointments  In addition, I have reviewed and discussed with patient certain preventive protocols, quality metrics, and best practice recommendations. A written personalized care plan for preventive services as well as general preventive health recommendations were provided to patient via mail.     Varney Biles, LPN   02/16/9145

## 2020-07-17 NOTE — Patient Instructions (Addendum)
Nicole Washington , Thank you for taking time to come for your Medicare Wellness Visit. I appreciate your ongoing commitment to your health goals. Please review the following plan we discussed and let me know if I can assist you in the future.   These are the goals we discussed: Goals      Patient Stated   .  Follow up with pcp as needed (pt-stated)       This is a list of the screening recommended for you and due dates:  Health Maintenance  Topic Date Due  . Tetanus Vaccine  Never done  . Zoster (Shingles) Vaccine (1 of 2) Never done  . COVID-19 Vaccine (3 - Pfizer risk 4-dose series) 06/06/2019  . Flu Shot  09/16/2020  . Mammogram  07/12/2021  . Colon Cancer Screening  10/06/2023  . DEXA scan (bone density measurement)  Completed  . Hepatitis C Screening: USPSTF Recommendation to screen - Ages 64-79 yo.  Completed  . Pneumonia vaccines  Completed  . HPV Vaccine  Aged Out   Advanced directives: End of life planning; Advance aging; Advanced directives discussed.  Copy of current HCPOA/Living Will requested.    Conditions/risks identified: none new  Follow up in one year for your annual wellness visit    Preventive Care 65 Years and Older, Female Preventive care refers to lifestyle choices and visits with your health care provider that can promote health and wellness. What does preventive care include?  A yearly physical exam. This is also called an annual well check.  Dental exams once or twice a year.  Routine eye exams. Ask your health care provider how often you should have your eyes checked.  Personal lifestyle choices, including:  Daily care of your teeth and gums.  Regular physical activity.  Eating a healthy diet.  Avoiding tobacco and drug use.  Limiting alcohol use.  Practicing safe sex.  Taking low-dose aspirin every day.  Taking vitamin and mineral supplements as recommended by your health care provider. What happens during an annual well check? The  services and screenings done by your health care provider during your annual well check will depend on your age, overall health, lifestyle risk factors, and family history of disease. Counseling  Your health care provider may ask you questions about your:  Alcohol use.  Tobacco use.  Drug use.  Emotional well-being.  Home and relationship well-being.  Sexual activity.  Eating habits.  History of falls.  Memory and ability to understand (cognition).  Work and work Statistician.  Reproductive health. Screening  You may have the following tests or measurements:  Height, weight, and BMI.  Blood pressure.  Lipid and cholesterol levels. These may be checked every 5 years, or more frequently if you are over 30 years old.  Skin check.  Lung cancer screening. You may have this screening every year starting at age 71 if you have a 30-pack-year history of smoking and currently smoke or have quit within the past 15 years.  Fecal occult blood test (FOBT) of the stool. You may have this test every year starting at age 71.  Flexible sigmoidoscopy or colonoscopy. You may have a sigmoidoscopy every 5 years or a colonoscopy every 10 years starting at age 65.  Hepatitis C blood test.  Hepatitis B blood test.  Sexually transmitted disease (STD) testing.  Diabetes screening. This is done by checking your blood sugar (glucose) after you have not eaten for a while (fasting). You may have this done every 1-3  years.  Bone density scan. This is done to screen for osteoporosis. You may have this done starting at age 71.  Mammogram. This may be done every 1-2 years. Talk to your health care provider about how often you should have regular mammograms. Talk with your health care provider about your test results, treatment options, and if necessary, the need for more tests. Vaccines  Your health care provider may recommend certain vaccines, such as:  Influenza vaccine. This is recommended  every year.  Tetanus, diphtheria, and acellular pertussis (Tdap, Td) vaccine. You may need a Td booster every 10 years.  Zoster vaccine. You may need this after age 71.  Pneumococcal 13-valent conjugate (PCV13) vaccine. One dose is recommended after age 71.  Pneumococcal polysaccharide (PPSV23) vaccine. One dose is recommended after age 71. Talk to your health care provider about which screenings and vaccines you need and how often you need them. This information is not intended to replace advice given to you by your health care provider. Make sure you discuss any questions you have with your health care provider. Document Released: 03/01/2015 Document Revised: 10/23/2015 Document Reviewed: 12/04/2014 Elsevier Interactive Patient Education  2017 [place] Prevention in the Home Falls can cause injuries. They can happen to people of all ages. There are many things you can do to make your home safe and to help prevent falls. What can I do on the outside of my home?  Regularly fix the edges of walkways and driveways and fix any cracks.  Remove anything that might make you trip as you walk through a door, such as a raised step or threshold.  Trim any bushes or trees on the path to your home.  Use bright outdoor lighting.  Clear any walking paths of anything that might make someone trip, such as rocks or tools.  Regularly check to see if handrails are loose or broken. Make sure that both sides of any steps have handrails.  Any raised decks and porches should have guardrails on the edges.  Have any leaves, snow, or ice cleared regularly.  Use sand or salt on walking paths during winter.  Clean up any spills in your garage right away. This includes oil or grease spills. What can I do in the bathroom?  Use night lights.  Install grab bars by the toilet and in the tub and shower. Do not use towel bars as grab bars.  Use non-skid mats or decals in the tub or shower.  If  you need to sit down in the shower, use a plastic, non-slip stool.  Keep the floor dry. Clean up any water that spills on the floor as soon as it happens.  Remove soap buildup in the tub or shower regularly.  Attach bath mats securely with double-sided non-slip rug tape.  Do not have throw rugs and other things on the floor that can make you trip. What can I do in the bedroom?  Use night lights.  Make sure that you have a light by your bed that is easy to reach.  Do not use any sheets or blankets that are too big for your bed. They should not hang down onto the floor.  Have a firm chair that has side arms. You can use this for support while you get dressed.  Do not have throw rugs and other things on the floor that can make you trip. What can I do in the kitchen?  Clean up any spills right away.  Avoid walking on wet floors.  Keep items that you use a lot in easy-to-reach places.  If you need to reach something above you, use a strong step stool that has a grab bar.  Keep electrical cords out of the way.  Do not use floor polish or wax that makes floors slippery. If you must use wax, use non-skid floor wax.  Do not have throw rugs and other things on the floor that can make you trip. What can I do with my stairs?  Do not leave any items on the stairs.  Make sure that there are handrails on both sides of the stairs and use them. Fix handrails that are broken or loose. Make sure that handrails are as long as the stairways.  Check any carpeting to make sure that it is firmly attached to the stairs. Fix any carpet that is loose or worn.  Avoid having throw rugs at the top or bottom of the stairs. If you do have throw rugs, attach them to the floor with carpet tape.  Make sure that you have a light switch at the top of the stairs and the bottom of the stairs. If you do not have them, ask someone to add them for you. What else can I do to help prevent falls?  Wear shoes  that:  Do not have high heels.  Have rubber bottoms.  Are comfortable and fit you well.  Are closed at the toe. Do not wear sandals.  If you use a stepladder:  Make sure that it is fully opened. Do not climb a closed stepladder.  Make sure that both sides of the stepladder are locked into place.  Ask someone to hold it for you, if possible.  Clearly mark and make sure that you can see:  Any grab bars or handrails.  First and last steps.  Where the edge of each step is.  Use tools that help you move around (mobility aids) if they are needed. These include:  Canes.  Walkers.  Scooters.  Crutches.  Turn on the lights when you go into a dark area. Replace any light bulbs as soon as they burn out.  Set up your furniture so you have a clear path. Avoid moving your furniture around.  If any of your floors are uneven, fix them.  If there are any pets around you, be aware of where they are.  Review your medicines with your doctor. Some medicines can make you feel dizzy. This can increase your chance of falling. Ask your doctor what other things that you can do to help prevent falls. This information is not intended to replace advice given to you by your health care provider. Make sure you discuss any questions you have with your health care provider. Document Released: 11/29/2008 Document Revised: 07/11/2015 Document Reviewed: 03/09/2014 Elsevier Interactive Patient Education  2017 [employer].

## 2020-07-30 DIAGNOSIS — M47816 Spondylosis without myelopathy or radiculopathy, lumbar region: Secondary | ICD-10-CM | POA: Diagnosis not present

## 2020-07-31 DIAGNOSIS — F411 Generalized anxiety disorder: Secondary | ICD-10-CM | POA: Diagnosis not present

## 2020-07-31 DIAGNOSIS — F39 Unspecified mood [affective] disorder: Secondary | ICD-10-CM | POA: Diagnosis not present

## 2020-07-31 DIAGNOSIS — F5105 Insomnia due to other mental disorder: Secondary | ICD-10-CM | POA: Diagnosis not present

## 2020-07-31 DIAGNOSIS — M792 Neuralgia and neuritis, unspecified: Secondary | ICD-10-CM | POA: Diagnosis not present

## 2020-07-31 DIAGNOSIS — R251 Tremor, unspecified: Secondary | ICD-10-CM | POA: Diagnosis not present

## 2020-07-31 DIAGNOSIS — F4312 Post-traumatic stress disorder, chronic: Secondary | ICD-10-CM | POA: Diagnosis not present

## 2020-08-06 ENCOUNTER — Other Ambulatory Visit: Payer: Self-pay

## 2020-08-06 ENCOUNTER — Ambulatory Visit (INDEPENDENT_AMBULATORY_CARE_PROVIDER_SITE_OTHER): Payer: Medicare Other | Admitting: Family Medicine

## 2020-08-06 ENCOUNTER — Encounter: Payer: Self-pay | Admitting: Family Medicine

## 2020-08-06 DIAGNOSIS — F32A Depression, unspecified: Secondary | ICD-10-CM

## 2020-08-06 DIAGNOSIS — R251 Tremor, unspecified: Secondary | ICD-10-CM

## 2020-08-06 DIAGNOSIS — E669 Obesity, unspecified: Secondary | ICD-10-CM | POA: Diagnosis not present

## 2020-08-06 DIAGNOSIS — G8929 Other chronic pain: Secondary | ICD-10-CM | POA: Diagnosis not present

## 2020-08-06 DIAGNOSIS — M545 Low back pain, unspecified: Secondary | ICD-10-CM | POA: Diagnosis not present

## 2020-08-06 DIAGNOSIS — F419 Anxiety disorder, unspecified: Secondary | ICD-10-CM | POA: Diagnosis not present

## 2020-08-06 MED ORDER — SEMAGLUTIDE-WEIGHT MANAGEMENT 1 MG/0.5ML ~~LOC~~ SOAJ: 1.0000 mg | SUBCUTANEOUS | 0 refills | Status: DC

## 2020-08-06 MED ORDER — SEMAGLUTIDE-WEIGHT MANAGEMENT 2.4 MG/0.75ML ~~LOC~~ SOAJ: 2.4000 mg | SUBCUTANEOUS | 0 refills | Status: DC

## 2020-08-06 MED ORDER — SEMAGLUTIDE-WEIGHT MANAGEMENT 0.25 MG/0.5ML ~~LOC~~ SOAJ: 0.2500 mg | SUBCUTANEOUS | 0 refills | Status: DC

## 2020-08-06 MED ORDER — SEMAGLUTIDE-WEIGHT MANAGEMENT 1.7 MG/0.75ML ~~LOC~~ SOAJ: 1.7000 mg | SUBCUTANEOUS | 0 refills | Status: DC

## 2020-08-06 MED ORDER — SEMAGLUTIDE-WEIGHT MANAGEMENT 0.5 MG/0.5ML ~~LOC~~ SOAJ: 0.5000 mg | SUBCUTANEOUS | 0 refills | Status: DC

## 2020-08-06 NOTE — Patient Instructions (Signed)
Nice to see you. We will try wegovy to see if that helps with weight loss. If you develop abdominal pain or nausea or have any issues in the area of your thyroid in her neck please let us know right away. Please cut down on carbohydrate and sugar intake.  Diet Recommendations  Starchy (carb) foods: Bread, rice, pasta, potatoes, corn, cereal, grits, crackers, bagels, muffins, all baked goods.  (Fruits, milk, and yogurt also have carbohydrate, but most of these foods will not spike your blood sugar as the starchy foods will.)  A few fruits do cause high blood sugars; use small portions of bananas (limit to 1/2 at a time), grapes, watermelon, oranges, and most tropical fruits.    Protein foods: Meat, fish, poultry, eggs, dairy foods, and beans such as pinto and kidney beans (beans also provide carbohydrate).   1. Eat at least 3 meals and 1-2 snacks per day. Never go more than 4-5 hours while awake without eating. Eat breakfast within the first hour of getting up.   2. Limit starchy foods to TWO per meal and ONE per snack. ONE portion of a starchy  food is equal to the following:   - ONE slice of bread (or its equivalent, such as half of a hamburger bun).   - 1/2 cup of a "scoopable" starchy food such as potatoes or rice.   - 15 grams of carbohydrate as shown on food label.  3. Include at every meal: a protein food, a carb food, and vegetables and/or fruit.   - Obtain twice the volume of veg's as protein or carbohydrate foods for both lunch and dinner.   - Fresh or frozen veg's are best.   - Keep frozen veg's on hand for a quick vegetable serving.

## 2020-08-06 NOTE — Progress Notes (Signed)
Tommi Rumps, MD Phone: [phone]  Nicole Washington is a 70 y.o. female who presents today for follow-up.  Obesity: The patient notes she just does not have any willpower regarding her diet.  She has been eating too much sugar.  Her exercise has been limited by low back issues.  She wants to start on medication to help with weight loss.  She denies personal or family history of thyroid cancer, parathyroid cancer, and adrenal gland cancer.  She notes no gallbladder issues.  No history of pancreatitis.  Chronic low back pain: She is following with orthopedics for this.  She got 4 shots in her back last week.  Notes her back is worse in the morning and gets better throughout the day.  She sees them for follow-up in the near future.  Depression/anxiety: Patient notes lots of anxiety.  She notes her weight gain weighs on her.  She discussed the weight with her psychiatrist and they stopped her Seroquel to see if that would help.  They started her on trazodone.  She notes no SI.  Tremors: Patient notes she was initially told she had parkinsonism by the neurology nurse practitioner though she saw the neurologist and he advised that she did not have parkinsonism per the patient's report.  She does continue to have tremors.  She still sees neurology for this.  She reports she is no longer taking Sinemet.  Social History   Tobacco Use  Smoking Status Former   Pack years: 0.00   Types: Cigarettes   Quit date: 12/20/1989   Years since quitting: 30.6  Smokeless Tobacco Never    Current Outpatient Medications on File Prior to Visit  Medication Sig Dispense Refill   amLODipine (NORVASC) 5 MG tablet TAKE 1 TABLET BY MOUTH EVERY DAY 90 tablet 1   aspirin EC 81 MG tablet Take 81 mg by mouth daily.     atorvastatin (LIPITOR) 40 MG tablet TAKE 1 TABLET BY MOUTH EVERY DAY 90 tablet 1   carbamazepine (TEGRETOL) 200 MG tablet      carvedilol (COREG) 3.125 MG tablet TAKE 1 TABLET (3.125 MG TOTAL) BY MOUTH 2  (TWO) TIMES DAILY WITH A MEAL. 180 tablet 1   clonazePAM (KLONOPIN) 0.5 MG tablet TAKE 2 TABLETS BY MOUTH DAILY AS NEEDED FOR ANXIETY 60 tablet 1   DULoxetine (CYMBALTA) 60 MG capsule Take 60 mg by mouth 2 (two) times daily.     lamoTRIgine (LAMICTAL) 25 MG tablet Take 150 mg by mouth 2 (two) times daily.     losartan (COZAAR) 25 MG tablet TAKE 1 TABLET BY MOUTH EVERY DAY 90 tablet 1   omeprazole (PRILOSEC) 20 MG capsule TAKE 1 CAPSULE BY MOUTH EVERY DAY 90 capsule 1   pregabalin (LYRICA) 50 MG capsule Take 150 mg by mouth 2 (two) times daily.     sertraline (ZOLOFT) 50 MG tablet Take 50 mg by mouth daily.     No current facility-administered medications on file prior to visit.     ROS see history of present illness  Objective  Physical Exam Vitals:   08/06/20 1221  BP: 124/80  Pulse: 93  Temp: 98.6 F (37 C)  SpO2: 96%    BP Readings from Last 3 Encounters:  08/06/20 124/80  04/30/20 110/70  11/01/19 120/72   Wt Readings from Last 3 Encounters:  08/06/20 193 lb 12.8 oz (87.9 kg)  07/17/20 189 lb (85.7 kg)  04/30/20 189 lb 12.8 oz (86.1 kg)    Physical Exam Constitutional:  General: She is not in acute distress.    Appearance: She is not diaphoretic.  Cardiovascular:     Rate and Rhythm: Normal rate and regular rhythm.     Heart sounds: Normal heart sounds.  Pulmonary:     Effort: Pulmonary effort is normal.     Breath sounds: Normal breath sounds.  Skin:    General: Skin is warm and dry.  Neurological:     Mental Status: She is alert.     Assessment/Plan: Please see individual problem list.  Problem List Items Addressed This Visit     Anxiety and depression    This is a chronic issue.  I agree with stopping the Seroquel to see if that was contributing to her weight gain.  Her medications are managed by psychiatry.       Chronic low back pain    Chronic issue.  She will continue to see orthopedics.       Obesity, Class II, BMI 35-39.9    This  is a chronic issue with worsening weight.  The patient has had trouble losing weight.  Discussed that diet and exercise need to be in place for weight loss to occur in an effective manner.  Her exercise is limited by her chronic back issues.  Discussed that we could trial [facility] for weight loss.  Discussed risk for nausea as well as abdominal pain.  I also discussed the risk for pancreatitis as well as gallbladder disease.  She will contact us immediately if she develops abdominal pain or excessive nausea.  I did discuss that medullary thyroid cancer has been seen in rats studies though this appears to be a minimal risk in humans.  Discussed if she developed any thyroid changes she would need to let us know right away.  Mancel Parsons was sent to the pharmacy.       Tremor    She will continue to see neurology.        Return in about 2 months (around 10/06/2020).  This visit occurred during the SARS-CoV-2 public health emergency.  Safety protocols were in place, including screening questions prior to the visit, additional usage of staff PPE, and extensive cleaning of exam room while observing appropriate contact time as indicated for disinfecting solutions.    Tommi Rumps, MD [place]

## 2020-08-07 ENCOUNTER — Telehealth: Payer: Self-pay | Admitting: Family Medicine

## 2020-08-07 DIAGNOSIS — R7303 Prediabetes: Secondary | ICD-10-CM

## 2020-08-07 MED ORDER — SEMAGLUTIDE-WEIGHT MANAGEMENT 2.4 MG/0.75ML ~~LOC~~ SOAJ: 2.4000 mg | SUBCUTANEOUS | 0 refills | Status: DC

## 2020-08-07 NOTE — Telephone Encounter (Signed)
Patient's pharmacy does not have Semaglutide-Weight Management 0.25 MG/0.5ML SOAJ, but [facility], on [address] has the 2.4

## 2020-08-07 NOTE — Telephone Encounter (Signed)
Patient called back and it is the Semaglutide-Weight Management 2.4 MG/0.75ML SOAJ, she gave the MG from last note on the bottom

## 2020-08-08 DIAGNOSIS — F4310 Post-traumatic stress disorder, unspecified: Secondary | ICD-10-CM | POA: Diagnosis not present

## 2020-08-08 DIAGNOSIS — G894 Chronic pain syndrome: Secondary | ICD-10-CM | POA: Diagnosis not present

## 2020-08-08 DIAGNOSIS — F419 Anxiety disorder, unspecified: Secondary | ICD-10-CM | POA: Diagnosis not present

## 2020-08-08 NOTE — Telephone Encounter (Signed)
Patient called about the status on the note below.

## 2020-08-08 NOTE — Assessment & Plan Note (Addendum)
This is a chronic issue with worsening weight.  The patient has had trouble losing weight.  Discussed that diet and exercise need to be in place for weight loss to occur in an effective manner.  Her exercise is limited by her chronic back issues.  Discussed that we could trial [facility] for weight loss.  Discussed risk for nausea as well as abdominal pain.  I also discussed the risk for pancreatitis as well as gallbladder disease.  She will contact us immediately if she develops abdominal pain or excessive nausea.  I did discuss that medullary thyroid cancer has been seen in rats studies though this appears to be a minimal risk in humans.  Discussed if she developed any thyroid changes she would need to let us know right away.  Mancel Parsons was sent to the pharmacy.

## 2020-08-08 NOTE — Assessment & Plan Note (Signed)
She will continue to see neurology. 

## 2020-08-08 NOTE — Telephone Encounter (Signed)
Correct mg has been sent. If the pharmacy still doesn't have which pharmacy has it and we can send it to there if that is the issue.

## 2020-08-08 NOTE — Assessment & Plan Note (Signed)
This is a chronic issue.  I agree with stopping the Seroquel to see if that was contributing to her weight gain.  Her medications are managed by psychiatry.

## 2020-08-08 NOTE — Assessment & Plan Note (Signed)
Chronic issue.  She will continue to see orthopedics.

## 2020-08-09 MED ORDER — SAXENDA 18 MG/3ML ~~LOC~~ SOPN: PEN_INJECTOR | SUBCUTANEOUS | 0 refills | Status: DC

## 2020-08-09 NOTE — Addendum Note (Signed)
Addended by: Leone Haven on: 08/09/2020 05:21 PM   Modules accepted: Orders

## 2020-08-09 NOTE — Telephone Encounter (Signed)
[facility] [address] Her pharmacy does not have it.

## 2020-08-09 NOTE — Telephone Encounter (Signed)
Please be advised that patient is requesting the 2.4

## 2020-08-09 NOTE — Telephone Encounter (Signed)
[facility]

## 2020-08-09 NOTE — Telephone Encounter (Signed)
LVM for patient to call back about medication.  Nicole Washington,cma

## 2020-08-09 NOTE — Telephone Encounter (Signed)
LVM for patient to call back.   Harold Mattes,cma  

## 2020-08-09 NOTE — Telephone Encounter (Signed)
Noted.  We have to taper up on this.  She also started at the lowest dose and work her way up to the highest dose as was prescribed.  If she does not want to wait on the wegovy to come into stock we could try Saxenda which is a similar medicine is just a daily injection instead of a weekly injection.

## 2020-08-12 NOTE — Telephone Encounter (Signed)
Pt called and would like a call back about the Saxenda

## 2020-08-14 DIAGNOSIS — M47816 Spondylosis without myelopathy or radiculopathy, lumbar region: Secondary | ICD-10-CM | POA: Diagnosis not present

## 2020-08-14 DIAGNOSIS — E119 Type 2 diabetes mellitus without complications: Secondary | ICD-10-CM | POA: Insufficient documentation

## 2020-08-14 DIAGNOSIS — R7303 Prediabetes: Secondary | ICD-10-CM | POA: Insufficient documentation

## 2020-08-14 MED ORDER — OZEMPIC (0.25 OR 0.5 MG/DOSE) 2 MG/1.5ML ~~LOC~~ SOPN: PEN_INJECTOR | SUBCUTANEOUS | 0 refills | Status: DC

## 2020-08-14 NOTE — Telephone Encounter (Signed)
I called and spoke with the patient's husband and I informed him that the provider sent in [place] for the patient and  that ist was sent to [facility].  Alexzander Dolinger,cma

## 2020-08-14 NOTE — Telephone Encounter (Signed)
We can try ozempic instead as she has prediabetes based on her prior A1c. This is similar to the wegovy. I will send in the starting dose.

## 2020-08-14 NOTE — Telephone Encounter (Signed)
Patient called about the on going note below, she would like to know it medication has been called in.

## 2020-08-14 NOTE — Addendum Note (Signed)
Addended by: Caryl Bis, Raghad Lorenz G on: 08/14/2020 12:19 PM   Modules accepted: Orders

## 2020-08-26 DIAGNOSIS — F411 Generalized anxiety disorder: Secondary | ICD-10-CM | POA: Diagnosis not present

## 2020-08-26 DIAGNOSIS — F5105 Insomnia due to other mental disorder: Secondary | ICD-10-CM | POA: Diagnosis not present

## 2020-08-26 DIAGNOSIS — F4312 Post-traumatic stress disorder, chronic: Secondary | ICD-10-CM | POA: Diagnosis not present

## 2020-08-26 DIAGNOSIS — F39 Unspecified mood [affective] disorder: Secondary | ICD-10-CM | POA: Diagnosis not present

## 2020-08-27 DIAGNOSIS — M47816 Spondylosis without myelopathy or radiculopathy, lumbar region: Secondary | ICD-10-CM | POA: Diagnosis not present

## 2020-08-28 ENCOUNTER — Telehealth: Payer: Self-pay | Admitting: Family Medicine

## 2020-08-28 NOTE — Telephone Encounter (Signed)
patient has appointment with Dr Olivia Mackie in office tomorrow.

## 2020-08-28 NOTE — Telephone Encounter (Signed)
Transfer to Access Nurse  PT has had a 150/98 BP since Thursday and stated no other symptoms other then concern over the BP. Advise the Access Nurse there is no apts available.

## 2020-08-28 NOTE — Telephone Encounter (Signed)
I will forward this to Dr McLean-Scocuzza so she is aware. I did speak with the patients psychiatrist yesterday as she messaged me to speak about the patient. 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 The patient also had not mentioned this medication to the psychiatrist until this week. The psychiatrist was concerned about possible tegretol toxicity contributing to some of her symptoms (intermittent confusion) particularly given that she is also on lamictal. It may be of benefit to check a tegretol level as well as LFTs and an ammonia level if she is in the office to follow-up on the concerns of the psychiatrist. I can certainly order these things if needed.

## 2020-08-29 ENCOUNTER — Other Ambulatory Visit: Payer: Self-pay

## 2020-08-29 ENCOUNTER — Encounter: Payer: Self-pay | Admitting: Internal Medicine

## 2020-08-29 ENCOUNTER — Ambulatory Visit (INDEPENDENT_AMBULATORY_CARE_PROVIDER_SITE_OTHER): Payer: Medicare Other | Admitting: Internal Medicine

## 2020-08-29 VITALS — BP 146/88 | HR 84 | Temp 98.0°F | Ht 62.0 in | Wt 187.2 lb

## 2020-08-29 DIAGNOSIS — R110 Nausea: Secondary | ICD-10-CM

## 2020-08-29 DIAGNOSIS — I152 Hypertension secondary to endocrine disorders: Secondary | ICD-10-CM | POA: Insufficient documentation

## 2020-08-29 DIAGNOSIS — I10 Essential (primary) hypertension: Secondary | ICD-10-CM | POA: Diagnosis not present

## 2020-08-29 DIAGNOSIS — E1159 Type 2 diabetes mellitus with other circulatory complications: Secondary | ICD-10-CM | POA: Insufficient documentation

## 2020-08-29 DIAGNOSIS — F32A Depression, unspecified: Secondary | ICD-10-CM

## 2020-08-29 DIAGNOSIS — R7303 Prediabetes: Secondary | ICD-10-CM | POA: Diagnosis not present

## 2020-08-29 DIAGNOSIS — E669 Obesity, unspecified: Secondary | ICD-10-CM

## 2020-08-29 DIAGNOSIS — F419 Anxiety disorder, unspecified: Secondary | ICD-10-CM

## 2020-08-29 MED ORDER — CARBAMAZEPINE 200 MG PO TABS: ORAL_TABLET | ORAL | Status: DC

## 2020-08-29 MED ORDER — LOSARTAN POTASSIUM 50 MG PO TABS: 50.0000 mg | ORAL_TABLET | Freq: Every day | ORAL | 3 refills | Status: DC

## 2020-08-29 MED ORDER — ONDANSETRON HCL 4 MG PO TABS: 4.0000 mg | ORAL_TABLET | Freq: Three times a day (TID) | ORAL | 0 refills | Status: DC | PRN

## 2020-08-29 NOTE — Progress Notes (Signed)
Chief Complaint  Patient presents with  . Blood Pressure Check   F/u 1. Htn BP elevated 140s-150s/90s-100s with h/a 5/10, dizziness, nausea  2. Prediabetes and obesity she has been on ozempic 0.25 x 2 doses and weaning off lamictal per Dr. Nicolasa Ducking  3. Panic/anxiety attack Monday 08/26/20 at psych appt Dr. Nicolasa Ducking pending psychologist appt Eliezer Lofts in [place] soon 4. Chronic pain back x 4 injections this week at [facility]  Constitutional:  Negative for weight loss.  HENT:  Negative for hearing loss.   Eyes:  Negative for blurred vision.  Respiratory:  Negative for shortness of breath.   Cardiovascular:  Negative for chest pain.  Gastrointestinal:  Positive for nausea.  Skin:  Negative for rash.  Neurological:  Positive for dizziness and headaches.  Past Medical History:  Diagnosis Date  . Burning mouth syndrome   . CAD (coronary artery disease)   . Cancer (HCC)    basal and squamous cell  . Depression   . GERD (gastroesophageal reflux disease)   . Hyperlipidemia   . Hypertension   . Thyroid disease    Past Surgical History:  Procedure Laterality Date  . BREAST BIOPSY Right yrs ago   benign, marker placed  . CESAREAN SECTION     Family History  Problem Relation Age of Onset  . Cerebral aneurysm Mother 39  . COPD Father   . Cancer Brother        ? colon  . Lupus Other   . Heart Problems Paternal Grandfather   . Breast cancer Neg Hx    Social History   Socioeconomic History  . Marital status: Married    Spouse name: Not on file  . Number of children: Not on file  . Years of education: Not on file  . Highest education level: Not on file  Occupational History  . Not on file  Tobacco Use  . Smoking status: Former    Types: Cigarettes    Quit date: 12/20/1989    Years since quitting: 30.7  . Smokeless tobacco: Never  Substance and Sexual Activity  . Alcohol use: Yes    Comment: 4 times a year  . Drug use: No  . Sexual activity: Not on file   Other Topics Concern  . Not on file  Social History Narrative  . Not on file   Social Determinants of Health   Financial Resource Strain: Low Risk   . Difficulty of Paying Living Expenses: Not hard at all  Food Insecurity: No Food Insecurity  . Worried About Charity fundraiser in the Last Year: Never true  . Ran Out of Food in the Last Year: Never true  Transportation Needs: No Transportation Needs  . Lack of Transportation (Medical): No  . Lack of Transportation (Non-Medical): No  Physical Activity: Unknown  . Days of Exercise per Week: 0 days  . Minutes of Exercise per Session: Not on file  Stress: No Stress Concern Present  . Feeling of Stress : Not at all  Social Connections: Not on file  Intimate Partner Violence: Not At Risk  . Fear of Current or Ex-Partner: No  . Emotionally Abused: No  . Physically Abused: No  . Sexually Abused: No   Current Meds  Medication Sig  . amLODipine (NORVASC) 5 MG tablet TAKE 1 TABLET BY MOUTH EVERY DAY  . atorvastatin (LIPITOR) 40 MG tablet TAKE 1 TABLET BY MOUTH EVERY DAY  . carvedilol (COREG) 3.125 MG tablet  TAKE 1 TABLET (3.125 MG TOTAL) BY MOUTH 2 (TWO) TIMES DAILY WITH A MEAL.  . DULoxetine (CYMBALTA) 60 MG capsule Take 60 mg by mouth 2 (two) times daily.  Marland Kitchen lamoTRIgine (LAMICTAL) 25 MG tablet Take 200 mg by mouth 2 (two) times daily.  Marland Kitchen omeprazole (PRILOSEC) 20 MG capsule TAKE 1 CAPSULE BY MOUTH EVERY DAY  . ondansetron (ZOFRAN) 4 MG tablet Take 1 tablet (4 mg total) by mouth every 8 (eight) hours as needed.  . pregabalin (LYRICA) 50 MG capsule Take 150 mg by mouth 2 (two) times daily.  . Semaglutide,0.25 or 0.5MG /DOS, (OZEMPIC, 0.25 OR 0.5 MG/DOSE,) 2 MG/1.5ML SOPN Inject 0.25 mg into the skin once a week for 28 days, THEN 0.5 mg once a week for 28 days.  Marland Kitchen sertraline (ZOLOFT) 50 MG tablet Take 50 mg by mouth daily.  . traMADol (ULTRAM) 50 MG tablet Take 50 mg by mouth.  . [DISCONTINUED] carbamazepine (TEGRETOL) 200 MG tablet   .  [DISCONTINUED] losartan (COZAAR) 25 MG tablet TAKE 1 TABLET BY MOUTH EVERY DAY   Allergies  Allergen Reactions  . Codeine Itching  . Tylenol [Acetaminophen] Itching  . Pneumovax 23 [Pneumococcal Vac Polyvalent] Swelling    Patient had the vaccine on 09/18/2019 and stated her arm is swollen as large as a orange and red and sore.  [name],cma    No results found for this or any previous visit (from the past 2160 hour(s)). Objective  Body mass index is 34.24 kg/m. Wt Readings from Last 3 Encounters:  08/29/20 187 lb 3.2 oz (84.9 kg)  08/06/20 193 lb 12.8 oz (87.9 kg)  07/17/20 189 lb (85.7 kg)   Temp Readings from Last 3 Encounters:  08/29/20 98 F (36.7 C) (Oral)  08/06/20 98.6 F (37 C) (Oral)  04/30/20 98.3 F (36.8 C) (Oral)   BP Readings from Last 3 Encounters:  08/29/20 (!) 146/88  08/06/20 124/80  04/30/20 110/70   Pulse Readings from Last 3 Encounters:  08/29/20 84  08/06/20 93  04/30/20 89    Physical Exam Vitals and nursing note reviewed.  Constitutional:      Appearance: Normal appearance. She is well-developed and well-groomed. She is obese.  HENT:     Head: Normocephalic and atraumatic.  Eyes:     Conjunctiva/sclera: Conjunctivae normal.     Pupils: Pupils are equal, round, and reactive to light.  Cardiovascular:     Rate and Rhythm: Normal rate and regular rhythm.     Heart sounds: Normal heart sounds. No murmur heard. Pulmonary:     Effort: Pulmonary effort is normal.     Breath sounds: Normal breath sounds.  Abdominal:     General: Abdomen is flat. Bowel sounds are normal.  Skin:    General: Skin is warm and moist.  Neurological:     General: No focal deficit present.     Mental Status: She is alert and oriented to person, place, and time. Mental status is at baseline.     Gait: Gait normal.  Psychiatric:        Attention and Perception: Attention and perception normal.        Mood and Affect: Mood and affect normal.        Speech: Speech normal.         Behavior: Behavior normal. Behavior is cooperative.        Thought Content: Thought content normal.        Cognition and Memory: Cognition and memory normal.  Judgment: Judgment normal.    Assessment  Plan  Hypertension, unspecified type - Plan: losartan (COZAAR) 50 MG tablet increased from 25 mg on coreg 3.125 mg bid, norvasc 5 mg qd   CBC with Differential/Platelet, Comprehensive metabolic panel, Hemoglobin A1c, Lipid panel,   Anxiety - Plan: carbamazepine (TEGRETOL) 200 MG tablet on 1.5 pills in am and 2 pills qhs  Anxiety and depression - Plan: carbamazepine (TEGRETOL) 200 MG tablet F/u Dr. Nicolasa Ducking  Pending appt with therapy jenna in [place]   Nausea ?related ozempic- Plan: CBC with Differential/Platelet, Comprehensive metabolic panel, ondansetron (ZOFRAN) 4 MG tablet, Urinalysis, Routine w reflex microscopic, CANCELED: Urinalysis, Routine w reflex microscopic  Prediabetes - Plan: Hemoglobin A1c Obesity (BMI 30-39.9)  Rec healthy diet and exercise    Provider: Dr. Olivia Mackie McLean-Scocuzza-[facility]

## 2020-08-29 NOTE — Patient Instructions (Addendum)
<130/<80 blood pressure goal let me know in 1 week your blood pressure readings please  DASH Eating Plan DASH stands for Dietary Approaches to Stop Hypertension. The DASH eating plan is a healthy eating plan that has been shown to: Reduce high blood pressure (hypertension). Reduce your risk for type 2 diabetes, heart disease, and stroke. Help with weight loss. What are tips for following this plan? Reading food labels Check food labels for the amount of salt (sodium) per serving. Choose foods with less than 5 percent of the Daily Value of sodium. Generally, foods with less than 300 milligrams (mg) of sodium per serving fit into this eating plan. To find whole grains, look for the word "whole" as the first word in the ingredient list. Shopping Buy products labeled as "low-sodium" or "no salt added." Buy fresh foods. Avoid canned foods and pre-made or frozen meals. Cooking Avoid adding salt when cooking. Use salt-free seasonings or herbs instead of table salt or sea salt. Check with your health care provider or pharmacist before using salt substitutes. Do not fry foods. Cook foods using healthy methods such as baking, boiling, grilling, roasting, and broiling instead. Cook with heart-healthy oils, such as olive, canola, avocado, soybean, or sunflower oil. Meal planning  Eat a balanced diet that includes: 4 or more servings of fruits and 4 or more servings of vegetables each day. Try to fill one-half of your plate with fruits and vegetables. 6-8 servings of whole grains each day. Less than 6 oz (170 g) of lean meat, poultry, or fish each day. A 3-oz (85-g) serving of meat is about the same size as a deck of cards. One egg equals 1 oz (28 g). 2-3 servings of low-fat dairy each day. One serving is 1 cup (237 mL). 1 serving of nuts, seeds, or beans 5 times each week. 2-3 servings of heart-healthy fats. Healthy fats called omega-3 fatty acids are found in foods such as walnuts, flaxseeds, fortified  milks, and eggs. These fats are also found in cold-water fish, such as sardines, salmon, and mackerel. Limit how much you eat of: Canned or prepackaged foods. Food that is high in trans fat, such as some fried foods. Food that is high in saturated fat, such as fatty meat. Desserts and other sweets, sugary drinks, and other foods with added sugar. Full-fat dairy products. Do not salt foods before eating. Do not eat more than 4 egg yolks a week. Try to eat at least 2 vegetarian meals a week. Eat more home-cooked food and less restaurant, buffet, and fast food.  Lifestyle When eating at a restaurant, ask that your food be prepared with less salt or no salt, if possible. If you drink alcohol: Limit how much you use to: 0-1 drink a day for women who are not pregnant. 0-2 drinks a day for men. Be aware of how much alcohol is in your drink. In the [place] one drink equals one 12 oz bottle of beer (355 mL), one 5 oz glass of wine (148 mL), or one 1 oz glass of hard liquor (44 mL). General information Avoid eating more than 2,300 mg of salt a day. If you have hypertension, you may need to reduce your sodium intake to 1,500 mg a day. Work with your health care provider to maintain a healthy body weight or to lose weight. Ask what an ideal weight is for you. Get at least 30 minutes of exercise that causes your heart to beat faster (aerobic exercise) most days of the  week. Activities may include walking, swimming, or biking. Work with your health care provider or dietitian to adjust your eating plan to your individual calorie needs. What foods should I eat? Fruits All fresh, dried, or frozen fruit. Canned fruit in natural juice (without addedsugar). Vegetables Fresh or frozen vegetables (raw, steamed, roasted, or grilled). Low-sodium or reduced-sodium tomato and vegetable juice. Low-sodium or reduced-sodium tomatosauce and tomato paste. Low-sodium or reduced-sodium canned  vegetables. Grains Whole-grain or whole-wheat bread. Whole-grain or whole-wheat pasta. Brown rice. [place]. Bulgur. Whole-grain and low-sodium cereals. Pita bread.Low-fat, low-sodium crackers. Whole-wheat flour tortillas. Meats and other proteins Skinless chicken or [place]. Ground chicken or [place]. Pork with fat trimmed off. Fish and seafood. Egg whites. Dried beans, peas, or lentils. Unsalted nuts, nut butters, and seeds. Unsalted canned beans. Lean cuts of beef with fat trimmed off. Low-sodium, lean precooked or cured meat, such as sausages or meatloaves. Dairy Low-fat (1%) or fat-free (skim) milk. Reduced-fat, low-fat, or fat-free cheeses. Nonfat, low-sodium ricotta or cottage cheese. Low-fat or nonfatyogurt. Low-fat, low-sodium cheese. Fats and oils Soft margarine without trans fats. Vegetable oil. Reduced-fat, low-fat, or light mayonnaise and salad dressings (reduced-sodium). Canola, safflower, olive, avocado, soybean, andsunflower oils. Avocado. Seasonings and condiments Herbs. Spices. Seasoning mixes without salt. Other foods Unsalted popcorn and pretzels. Fat-free sweets. The items listed above may not be a complete list of foods and beverages you can eat. Contact a dietitian for more information. What foods should I avoid? Fruits Canned fruit in a light or heavy syrup. Fried fruit. Fruit in cream or buttersauce. Vegetables Creamed or fried vegetables. Vegetables in a cheese sauce. Regular canned vegetables (not low-sodium or reduced-sodium). Regular canned tomato sauce and paste (not low-sodium or reduced-sodium). Regular tomato and vegetable juice(not low-sodium or reduced-sodium). Angie Fava. Olives. Grains Baked goods made with fat, such as croissants, muffins, or some breads. Drypasta or rice meal packs. Meats and other proteins Fatty cuts of meat. Ribs. Fried meat. Berniece Salines. Bologna, salami, and other precooked or cured meats, such as sausages or meat loaves. Fat from the back  of a pig (fatback). Bratwurst. Salted nuts and seeds. Canned beans with added salt. Canned orsmoked fish. Whole eggs or egg yolks. Chicken or [place] with skin. Dairy Whole or 2% milk, cream, and half-and-half. Whole or full-fat cream cheese. Whole-fat or sweetened yogurt. Full-fat cheese. Nondairy creamers. Whippedtoppings. Processed cheese and cheese spreads. Fats and oils Butter. Stick margarine. Lard. Shortening. Ghee. Bacon fat. Tropical oils, suchas coconut, palm kernel, or palm oil. Seasonings and condiments Onion salt, garlic salt, seasoned salt, table salt, and sea salt. Worcestershire sauce. Tartar sauce. Barbecue sauce. Teriyaki sauce. Soy sauce, including reduced-sodium. Steak sauce. Canned and packaged gravies. Fish sauce. Oyster sauce. Cocktail sauce. Store-bought horseradish. Ketchup. Mustard. Meat flavorings and tenderizers. Bouillon cubes. Hot sauces. Pre-made or packaged marinades. Pre-made or packaged taco seasonings. Relishes. Regular saladdressings. Other foods Salted popcorn and pretzels. The items listed above may not be a complete list of foods and beverages you should avoid. Contact a dietitian for more information. Where to find more information National Heart, Lung, and Blood Institute: https://wilson-eaton.com/ American Heart Association: www.heart.org Academy of Nutrition and Dietetics: www.eatright.[place]: www.kidney.org Summary The DASH eating plan is a healthy eating plan that has been shown to reduce high blood pressure (hypertension). It may also reduce your risk for type 2 diabetes, heart disease, and stroke. When on the DASH eating plan, aim to eat more fresh fruits and vegetables, whole grains, lean proteins, low-fat dairy, and heart-healthy  fats. With the DASH eating plan, you should limit salt (sodium) intake to 2,300 mg a day. If you have hypertension, you may need to reduce your sodium intake to 1,500 mg a day. Work with your health care  provider or dietitian to adjust your eating plan to your individual calorie needs. This information is not intended to replace advice given to you by your health care provider. Make sure you discuss any questions you have with your healthcare provider. Document Revised: 01/06/2019 Document Reviewed: 01/06/2019 Elsevier Patient Education  2022 [employer].  Hypertension, Adult High blood pressure (hypertension) is when the force of blood pumping through the arteries is too strong. The arteries are the blood vessels that carry blood from the heart throughout the body. Hypertension forces the heart to work harder to pump blood and may cause arteries to become narrow or stiff. Untreated or uncontrolled hypertension can cause a heart attack, heart failure, a stroke, kidney disease, and otherproblems. A blood pressure reading consists of a higher number over a lower number. Ideally, your blood pressure should be below 120/80. The first ("top") number is called the systolic pressure. It is a measure of the pressure in your arteries as your heart beats. The second ("bottom") number is called the diastolic pressure. It is a measure of the pressure in your arteries as theheart relaxes. What are the causes? The exact cause of this condition is not known. There are some conditions thatresult in or are related to high blood pressure. What increases the risk? Some risk factors for high blood pressure are under your control. The following factors may make you more likely to develop this condition: Smoking. Having type 2 diabetes mellitus, high cholesterol, or both. Not getting enough exercise or physical activity. Being overweight. Having too much fat, sugar, calories, or salt (sodium) in your diet. Drinking too much alcohol. Some risk factors for high blood pressure may be difficult or impossible to change. Some of these factors include: Having chronic kidney disease. Having a family history of high blood  pressure. Age. Risk increases with age. Race. You may be at higher risk if you are African American. Gender. Men are at higher risk than women before age 61. After age 69, women are at higher risk than men. Having obstructive sleep apnea. Stress. What are the signs or symptoms? High blood pressure may not cause symptoms. Very high blood pressure (hypertensive crisis) may cause: Headache. Anxiety. Shortness of breath. Nosebleed. Nausea and vomiting. Vision changes. Severe chest pain. Seizures. How is this diagnosed? This condition is diagnosed by measuring your blood pressure while you are seated, with your arm resting on a flat surface, your legs uncrossed, and your feet flat on the floor. The cuff of the blood pressure monitor will be placed directly against the skin of your upper arm at the level of your heart. It should be measured at least twice using the same arm. Certain conditions cancause a difference in blood pressure between your right and left arms. Certain factors can cause blood pressure readings to be lower or higher than normal for a short period of time: When your blood pressure is higher when you are in a health care provider's office than when you are at home, this is called white coat hypertension. Most people with this condition do not need medicines. When your blood pressure is higher at home than when you are in a health care provider's office, this is called masked hypertension. Most people with this condition may need  medicines to control blood pressure. If you have a high blood pressure reading during one visit or you have normal blood pressure with other risk factors, you may be asked to: Return on a different day to have your blood pressure checked again. Monitor your blood pressure at home for 1 week or longer. If you are diagnosed with hypertension, you may have other blood or imaging tests to help your health care provider understand your overall risk for  otherconditions. How is this treated? This condition is treated by making healthy lifestyle changes, such as eating healthy foods, exercising more, and reducing your alcohol intake. Your health care provider may prescribe medicine if lifestyle changes are not enough to get your blood pressure under control, and if: Your systolic blood pressure is above 130. Your diastolic blood pressure is above 80. Your personal target blood pressure may vary depending on your medicalconditions, your age, and other factors. Follow these instructions at home: Eating and drinking  Eat a diet that is high in fiber and potassium, and low in sodium, added sugar, and fat. An example eating plan is called the DASH (Dietary Approaches to Stop Hypertension) diet. To eat this way: Eat plenty of fresh fruits and vegetables. Try to fill one half of your plate at each meal with fruits and vegetables. Eat whole grains, such as whole-wheat pasta, brown rice, or whole-grain bread. Fill about one fourth of your plate with whole grains. Eat or drink low-fat dairy products, such as skim milk or low-fat yogurt. Avoid fatty cuts of meat, processed or cured meats, and poultry with skin. Fill about one fourth of your plate with lean proteins, such as fish, chicken without skin, beans, eggs, or tofu. Avoid pre-made and processed foods. These tend to be higher in sodium, added sugar, and fat. Reduce your daily sodium intake. Most people with hypertension should eat less than 1,500 mg of sodium a day. Do not drink alcohol if: Your health care provider tells you not to drink. You are pregnant, may be pregnant, or are planning to become pregnant. If you drink alcohol: Limit how much you use to: 0-1 drink a day for women. 0-2 drinks a day for men. Be aware of how much alcohol is in your drink. In the [place] one drink equals one 12 oz bottle of beer (355 mL), one 5 oz glass of wine (148 mL), or one 1 oz glass of hard liquor (44  mL).  Lifestyle  Work with your health care provider to maintain a healthy body weight or to lose weight. Ask what an ideal weight is for you. Get at least 30 minutes of exercise most days of the week. Activities may include walking, swimming, or biking. Include exercise to strengthen your muscles (resistance exercise), such as Pilates or lifting weights, as part of your weekly exercise routine. Try to do these types of exercises for 30 minutes at least 3 days a week. Do not use any products that contain nicotine or tobacco, such as cigarettes, e-cigarettes, and chewing tobacco. If you need help quitting, ask your health care provider. Monitor your blood pressure at home as told by your health care provider. Keep all follow-up visits as told by your health care provider. This is important.  Medicines Take over-the-counter and prescription medicines only as told by your health care provider. Follow directions carefully. Blood pressure medicines must be taken as prescribed. Do not skip doses of blood pressure medicine. Doing this puts you at risk for problems and can  make the medicine less effective. Ask your health care provider about side effects or reactions to medicines that you should watch for. Contact a health care provider if you: Think you are having a reaction to a medicine you are taking. Have headaches that keep coming back (recurring). Feel dizzy. Have swelling in your ankles. Have trouble with your vision. Get help right away if you: Develop a severe headache or confusion. Have unusual weakness or numbness. Feel faint. Have severe pain in your chest or abdomen. Vomit repeatedly. Have trouble breathing. Summary Hypertension is when the force of blood pumping through your arteries is too strong. If this condition is not controlled, it may put you at risk for serious complications. Your personal target blood pressure may vary depending on your medical conditions, your age, and  other factors. For most people, a normal blood pressure is less than 120/80. Hypertension is treated with lifestyle changes, medicines, or a combination of both. Lifestyle changes include losing weight, eating a healthy, low-sodium diet, exercising more, and limiting alcohol. This information is not intended to replace advice given to you by your health care provider. Make sure you discuss any questions you have with your healthcare provider. Document Revised: 10/13/2017 Document Reviewed: 10/13/2017 Elsevier Patient Education  [place].

## 2020-09-08 ENCOUNTER — Emergency Department: Payer: Medicare Other

## 2020-09-08 ENCOUNTER — Emergency Department
Admission: EM | Admit: 2020-09-08 | Discharge: 2020-09-08 | Disposition: A | Discharge status: Home / Self Care | Payer: Medicare Other | Attending: Emergency Medicine | Admitting: Emergency Medicine

## 2020-09-08 ENCOUNTER — Other Ambulatory Visit: Payer: Self-pay

## 2020-09-08 DIAGNOSIS — Z87891 Personal history of nicotine dependence: Secondary | ICD-10-CM | POA: Insufficient documentation

## 2020-09-08 DIAGNOSIS — Z85828 Personal history of other malignant neoplasm of skin: Secondary | ICD-10-CM | POA: Diagnosis not present

## 2020-09-08 DIAGNOSIS — E039 Hypothyroidism, unspecified: Secondary | ICD-10-CM | POA: Diagnosis not present

## 2020-09-08 DIAGNOSIS — R0981 Nasal congestion: Secondary | ICD-10-CM | POA: Insufficient documentation

## 2020-09-08 DIAGNOSIS — Z79899 Other long term (current) drug therapy: Secondary | ICD-10-CM | POA: Insufficient documentation

## 2020-09-08 DIAGNOSIS — E1159 Type 2 diabetes mellitus with other circulatory complications: Secondary | ICD-10-CM | POA: Insufficient documentation

## 2020-09-08 DIAGNOSIS — I10 Essential (primary) hypertension: Secondary | ICD-10-CM | POA: Diagnosis not present

## 2020-09-08 DIAGNOSIS — U071 COVID-19: Secondary | ICD-10-CM | POA: Diagnosis not present

## 2020-09-08 DIAGNOSIS — I2510 Atherosclerotic heart disease of native coronary artery without angina pectoris: Secondary | ICD-10-CM | POA: Diagnosis not present

## 2020-09-08 DIAGNOSIS — R059 Cough, unspecified: Secondary | ICD-10-CM | POA: Diagnosis not present

## 2020-09-08 MED ORDER — BENZONATATE 100 MG PO CAPS: ORAL_CAPSULE | ORAL | 0 refills | Status: DC

## 2020-09-08 MED ORDER — PREDNISONE 20 MG PO TABS: 40.0000 mg | ORAL_TABLET | Freq: Every day | ORAL | 0 refills | Status: AC

## 2020-09-08 NOTE — ED Notes (Signed)
See triage note  Presents with some nasal congestion  States she developed COVID sx's on Friday and tested positive  States she feel like she can't get a good breath b/c she is not able to breath thur her nose  Afebrile on arrival

## 2020-09-08 NOTE — ED Triage Notes (Signed)
Pt states that she tested positive for covid on Friday and is having a hard time breathing from her nose- pt states that she is having "all the other symptoms" as well

## 2020-09-08 NOTE — Discharge Instructions (Addendum)
Your exam including chest x-ray are normal and reassuring at this time.  You should take medications to help with symptom management including over-the-counter nasal steroids, dextromethorphan for cough management, and the prescription medications as provided.  Follow-up with your primary provider for ongoing symptom management.  Return to the ED if needed.

## 2020-09-08 NOTE — ED Provider Notes (Signed)
[facility] Emergency Department Provider Note  ____________________________________________   Event Date/Time   First MD Initiated Contact with Patient 09/08/20 1402     (approximate)  I have reviewed the triage vital signs and the nursing notes.   HISTORY  Chief Complaint Cough and Headache  HPI Nicole Washington is a 70 y.o. female with the below medical history including CAD, hypertension, thyroid disease, and GERD, presents to the ED with symptoms related to her recent positive COVID test.  Patient reports testing positive on Friday, and since that time has had some increased nasal congestion and a hard time breathing through her nose.  She reports other symptoms as well related to her COVID diagnosis.  Denies any chest pain, shortness of breath, or syncope.  Past Medical History:  Diagnosis Date   Burning mouth syndrome    CAD (coronary artery disease)    Cancer (HCC)    basal and squamous cell   Depression    GERD (gastroesophageal reflux disease)    Hyperlipidemia    Hypertension    Thyroid disease     Patient Active Problem List   Diagnosis Date Noted   Hypertension associated with diabetes ([place]) 08/29/2020   Prediabetes 08/14/2020   Tremor 11/01/2019   Elevated glucose 09/18/2019   Lump in neck 07/28/2019   Vitamin D deficiency 05/12/2019   Obesity, Class II, BMI 35-39.9 05/12/2019   Anxiety and depression 01/17/2019   Subclinical hypothyroidism 09/16/2018   GERD (gastroesophageal reflux disease) 09/12/2018   Tingling 06/21/2018   Weight gain due to medication 05/31/2018   Cold intolerance 05/31/2018   Abnormal thyroid blood test 05/31/2018   Elevated alkaline phosphatase level 05/31/2018   Atypical chest pain 03/11/2018   Knee pain 03/11/2018   Itching 01/19/2018   Tendon nodule 11/04/2017   Memory difficulty 05/03/2017   Twitching 05/03/2017   Allergic rhinitis 05/03/2017   Hot flashes due to menopause 02/02/2017   CAD (coronary  artery disease) 02/02/2017   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   Chronic low back pain 11/13/2016   Hypertension 10/27/2016   Hyperlipidemia 10/27/2016   PTSD (post-traumatic stress disorder) 10/27/2016    Past Surgical History:  Procedure Laterality Date   BREAST BIOPSY Right yrs ago   benign, marker placed   CESAREAN SECTION      Prior to Admission medications   Medication Sig Start Date End Date Taking? Authorizing Provider  benzonatate (TESSALON PERLES) 100 MG capsule Take 1-2 tabs TID prn cough 09/08/20  Yes Earnie Bechard, Dannielle Karvonen, PA-C  predniSONE (DELTASONE) 20 MG tablet Take 2 tablets (40 mg total) by mouth daily with breakfast for 5 days. 09/08/20 09/13/20 Yes Aditi Rovira, Dannielle Karvonen, PA-C  amLODipine (NORVASC) 5 MG tablet TAKE 1 TABLET BY MOUTH EVERY DAY 05/07/20   Leone Haven, MD  aspirin EC 81 MG tablet Take 81 mg by mouth daily. Patient not taking: Reported on 08/29/2020    [provider]  atorvastatin (LIPITOR) 40 MG tablet TAKE 1 TABLET BY MOUTH EVERY DAY 06/20/20   Leone Haven, MD  carbamazepine (TEGRETOL) 200 MG tablet 1.5 in am and 2 pills qhs 08/29/20   McLean-Scocuzza, Nino Glow, MD  carvedilol (COREG) 3.125 MG tablet TAKE 1 TABLET (3.125 MG TOTAL) BY MOUTH 2 (TWO) TIMES DAILY WITH A MEAL. 02/06/20   Leone Haven, MD  clonazePAM (KLONOPIN) 0.5 MG tablet TAKE 2 TABLETS BY MOUTH DAILY AS NEEDED FOR ANXIETY Patient not taking: Reported on 08/29/2020 06/10/20   Caryl Bis,  Angela Adam, MD  DULoxetine (CYMBALTA) 60 MG capsule Take 60 mg by mouth 2 (two) times daily.    [provider]  lamoTRIgine (LAMICTAL) 25 MG tablet Take 200 mg by mouth 2 (two) times daily. 04/19/18   [provider]  losartan (COZAAR) 50 MG tablet Take 1 tablet (50 mg total) by mouth daily. 08/29/20   McLean-Scocuzza, Nino Glow, MD  omeprazole (PRILOSEC) 20 MG capsule TAKE 1 CAPSULE BY MOUTH EVERY DAY 03/28/20   Leone Haven, MD  ondansetron (ZOFRAN) 4 MG tablet Take  1 tablet (4 mg total) by mouth every 8 (eight) hours as needed. 08/29/20   McLean-Scocuzza, Nino Glow, MD  pregabalin (LYRICA) 50 MG capsule Take 150 mg by mouth 2 (two) times daily. 07/27/19   [provider]  Semaglutide,0.25 or 0.'5MG'$ /DOS, (OZEMPIC, 0.25 OR 0.5 MG/DOSE,) 2 MG/1.5ML SOPN Inject 0.25 mg into the skin once a week for 28 days, THEN 0.5 mg once a week for 28 days. 08/14/20 10/09/20  Leone Haven, MD  sertraline (ZOLOFT) 50 MG tablet Take 50 mg by mouth daily. 04/20/18   [provider]  traMADol (ULTRAM) 50 MG tablet Take 50 mg by mouth.    [provider]    Allergies Codeine, Tylenol [acetaminophen], and Pneumovax 23 [pneumococcal vac polyvalent]  Family History  Problem Relation Age of Onset   Cerebral aneurysm Mother 106   COPD Father    Cancer Brother        ? colon   Lupus Other    Heart Problems Paternal Grandfather    Breast cancer Neg Hx     Social History Social History   Tobacco Use   Smoking status: Former    Types: Cigarettes    Quit date: 12/20/1989    Years since quitting: 30.7   Smokeless tobacco: Never  Substance Use Topics   Alcohol use: Yes    Comment: 4 times a year   Drug use: No    Review of Systems  Constitutional: No fever/chills Eyes: No visual changes. ENT: No sore throat. Cardiovascular: Denies chest pain. Respiratory: Denies shortness of breath. Gastrointestinal: No abdominal pain.  No nausea, no vomiting.  No diarrhea.  No constipation. Genitourinary: Negative for dysuria. Musculoskeletal: Negative for back pain. Skin: Negative for rash. Neurological: Negative for headaches, focal weakness or numbness. ____________________________________________   PHYSICAL EXAM:  VITAL SIGNS: ED Triage Vitals  Enc Vitals Group     BP 09/08/20 1305 (!) 150/93     Pulse Rate 09/08/20 1305 92     Resp 09/08/20 1305 20     Temp 09/08/20 1305 98.7 F (37.1 C)     Temp Source 09/08/20 1305 Oral     SpO2 09/08/20  1305 97 %     Weight 09/08/20 1306 186 lb (84.4 kg)     Height 09/08/20 1306 '5\' 2"'$  (1.575 m)     Head Circumference --      Peak Flow --      Pain Score 09/08/20 1306 7     Pain Loc --      Pain Edu? --      Excl. in [place]? --     Constitutional: Alert and oriented. Well appearing and in no acute distress. Eyes: Conjunctivae are normal. PERRL. EOMI. Head: Atraumatic. Nose: No congestion/rhinnorhea. Mouth/Throat: Mucous membranes are moist.  Oropharynx non-erythematous. Neck: No stridor.   Cardiovascular: Normal rate, regular rhythm. Grossly normal heart sounds.  Good peripheral circulation. Respiratory: Normal respiratory effort.  No  retractions. Lungs CTAB. Gastrointestinal: Soft and nontender. No distention. No abdominal bruits. No CVA tenderness. Musculoskeletal: No lower extremity tenderness nor edema.  No joint effusions. Neurologic:  Normal speech and language. No gross focal neurologic deficits are appreciated. No gait instability. Skin:  Skin is warm, dry and intact. No rash noted. Psychiatric: Mood and affect are normal. Speech and behavior are normal.  ____________________________________________   LABS (all labs ordered are listed, but only abnormal results are displayed)  Labs Reviewed - No data to display ____________________________________________  EKG   ____________________________________________  RADIOLOGY I, Melvenia Needles, personally viewed and evaluated these images (plain radiographs) as part of my medical decision making, as well as reviewing the written report by the radiologist.  ED MD interpretation:  agree with report  Official radiology report(s): DG Chest 2 View  Result Date: 09/08/2020 CLINICAL DATA:  cough. COVID+ EXAM: CHEST - 2 VIEW COMPARISON:  February 21, 2018 FINDINGS: The cardiomediastinal silhouette is unchanged in contour. No pleural effusion. No pneumothorax. No acute pleuroparenchymal abnormality. Visualized abdomen is  unremarkable. Mild degenerative changes of the thoracic spine. IMPRESSION: No acute cardiopulmonary abnormality. Electronically Signed   By: Valentino Saxon MD   On: 09/08/2020 14:53    ____________________________________________   PROCEDURES  Procedure(s) performed (including Critical Care):  Procedures  ____________________________________________   INITIAL IMPRESSION / ASSESSMENT AND PLAN / ED COURSE  As part of my medical decision making, I reviewed the following data within the [place] reviewed NAD and Notes from prior ED visits  Patient with ED evaluation of recent COVID diagnosis.  Patient presented with primary complaints of congestion and intermittent cough.  X-ray is reassuring as it shows no acute pneumonia or bronchitis.  Patient will be discharged at this time with a prescription for [facility] as well as a prednisone course.  We discussed the option for Paxlovid, but given the patient's multiple medications for seizure and pression, it was decided that she may not be the best candidate after discussion with the pharmacist.  Patient is agreeable to the plan, and will continue to manage her symptoms over-the-counter in addition to prescription medications.  She will follow-up with primary provider or return to the ED if needed. ____________________________________________   FINAL CLINICAL IMPRESSION(S) / ED DIAGNOSES  Final diagnoses:  [id number]     ED Discharge Orders          Ordered    benzonatate (TESSALON PERLES) 100 MG capsule        09/08/20 1433    predniSONE (DELTASONE) 20 MG tablet  Daily with breakfast        09/08/20 1433             Note:  This document was prepared using Dragon voice recognition software and may include unintentional dictation errors.    Melvenia Needles, PA-C 09/08/20 1632    Harvest Dark, MD 09/09/20 [phone]

## 2020-09-09 ENCOUNTER — Telehealth: Payer: Self-pay

## 2020-09-09 NOTE — Telephone Encounter (Signed)
Note from the ED already reviewed. Please follow-up with the patient to see how she is feeling.

## 2020-09-09 NOTE — Telephone Encounter (Signed)
Pt was seen in ED on 09/08/20 for covid symptoms.

## 2020-09-10 NOTE — Telephone Encounter (Signed)
I called the patient and she stated she feels much better, she still is coughing, and can't taste anything but she is feelling better than she did.  Samad Thon,cma

## 2020-09-12 ENCOUNTER — Other Ambulatory Visit: Payer: Self-pay | Admitting: Family Medicine

## 2020-09-13 DIAGNOSIS — F4312 Post-traumatic stress disorder, chronic: Secondary | ICD-10-CM | POA: Diagnosis not present

## 2020-09-13 DIAGNOSIS — F5105 Insomnia due to other mental disorder: Secondary | ICD-10-CM | POA: Diagnosis not present

## 2020-09-13 DIAGNOSIS — F39 Unspecified mood [affective] disorder: Secondary | ICD-10-CM | POA: Diagnosis not present

## 2020-09-13 DIAGNOSIS — F411 Generalized anxiety disorder: Secondary | ICD-10-CM | POA: Diagnosis not present

## 2020-09-17 DIAGNOSIS — G894 Chronic pain syndrome: Secondary | ICD-10-CM | POA: Diagnosis not present

## 2020-09-17 DIAGNOSIS — F438 Other reactions to severe stress: Secondary | ICD-10-CM | POA: Diagnosis not present

## 2020-09-18 ENCOUNTER — Other Ambulatory Visit (INDEPENDENT_AMBULATORY_CARE_PROVIDER_SITE_OTHER): Payer: Medicare Other

## 2020-09-18 ENCOUNTER — Other Ambulatory Visit: Payer: Self-pay

## 2020-09-18 DIAGNOSIS — R7303 Prediabetes: Secondary | ICD-10-CM

## 2020-09-18 DIAGNOSIS — R110 Nausea: Secondary | ICD-10-CM | POA: Diagnosis not present

## 2020-09-18 DIAGNOSIS — I10 Essential (primary) hypertension: Secondary | ICD-10-CM

## 2020-09-18 LAB — CBC WITH DIFFERENTIAL/PLATELET
Basophils Absolute: 0 10*3/uL (ref 0.0–0.1)
Basophils Relative: 0.5 % (ref 0.0–3.0)
Eosinophils Absolute: 0.1 10*3/uL (ref 0.0–0.7)
Eosinophils Relative: 1.2 % (ref 0.0–5.0)
HCT: 40 % (ref 36.0–46.0)
Hemoglobin: 13.4 g/dL (ref 12.0–15.0)
Lymphocytes Relative: 17.1 % (ref 12.0–46.0)
Lymphs Abs: 1 10*3/uL (ref 0.7–4.0)
MCHC: 33.6 g/dL (ref 30.0–36.0)
MCV: 84.5 fl (ref 78.0–100.0)
Monocytes Absolute: 0.4 10*3/uL (ref 0.1–1.0)
Monocytes Relative: 7.2 % (ref 3.0–12.0)
Neutro Abs: 4.5 10*3/uL (ref 1.4–7.7)
Neutrophils Relative %: 74 % (ref 43.0–77.0)
Platelets: 361 10*3/uL (ref 150.0–400.0)
RBC: 4.73 Mil/uL (ref 3.87–5.11)
RDW: 15.2 % (ref 11.5–15.5)
WBC: 6.1 10*3/uL (ref 4.0–10.5)

## 2020-09-18 LAB — LIPID PANEL
Cholesterol: 202 mg/dL — ABNORMAL HIGH (ref 0–200)
HDL: 74.3 mg/dL (ref >39.00)
LDL Cholesterol: 96 mg/dL (ref 0–99)
NonHDL: 127.35
Total CHOL/HDL Ratio: 3
Triglycerides: 156 mg/dL — ABNORMAL HIGH (ref 0.0–149.0)
VLDL: 31.2 mg/dL (ref 0.0–40.0)

## 2020-09-18 LAB — COMPREHENSIVE METABOLIC PANEL
ALT: 20 U/L (ref 0–35)
AST: 15 U/L (ref 0–37)
Albumin: 4.4 g/dL (ref 3.5–5.2)
Alkaline Phosphatase: 109 U/L (ref 39–117)
BUN: 18 mg/dL (ref 6–23)
CO2: 29 mEq/L (ref 19–32)
Calcium: 9.6 mg/dL (ref 8.4–10.5)
Chloride: 94 mEq/L — ABNORMAL LOW (ref 96–112)
Creatinine, Ser: 0.79 mg/dL (ref 0.40–1.20)
GFR: 75.43 mL/min (ref >60.00)
Glucose, Bld: 104 mg/dL — ABNORMAL HIGH (ref 70–99)
Potassium: 4.2 mEq/L (ref 3.5–5.1)
Sodium: 134 mEq/L — ABNORMAL LOW (ref 135–145)
Total Bilirubin: 0.5 mg/dL (ref 0.2–1.2)
Total Protein: 7.1 g/dL (ref 6.0–8.3)

## 2020-09-18 LAB — HEMOGLOBIN A1C: Hgb A1c MFr Bld: 6.6 % — ABNORMAL HIGH (ref 4.6–6.5)

## 2020-09-19 ENCOUNTER — Telehealth: Payer: Self-pay

## 2020-09-19 ENCOUNTER — Telehealth: Payer: Self-pay | Admitting: Pharmacist

## 2020-09-19 LAB — URINALYSIS, ROUTINE W REFLEX MICROSCOPIC
Bilirubin Urine: NEGATIVE
Glucose, UA: NEGATIVE
Hgb urine dipstick: NEGATIVE
Ketones, ur: NEGATIVE
Leukocytes,Ua: NEGATIVE
Nitrite: NEGATIVE
Protein, ur: NEGATIVE
Specific Gravity, Urine: 1.017 (ref 1.001–1.035)
pH: 5.5 (ref 5.0–8.0)

## 2020-09-19 NOTE — Telephone Encounter (Signed)
After calling the pharmacy and the pharmacist stating that they are out of ozempic and that it is for the whole month of august, I then spoke with Catie and she made a call and Catie informed me to give the patient a sample of Ozempic to pick up and I called the patient and informed her that we will give her a sample to pick up and she understood.  Tayshawn Purnell,cma

## 2020-09-19 NOTE — Telephone Encounter (Signed)
Pt called and states that her pharmacy is out of Ozempic and so is a [facility] near her. She wants to know what to do if she is out of this medication. She does not think she should just stop taking it. Please advise.

## 2020-09-19 NOTE — Telephone Encounter (Signed)
Error, duplicate telephone note

## 2020-09-23 ENCOUNTER — Other Ambulatory Visit: Payer: Self-pay | Admitting: Family Medicine

## 2020-09-25 DIAGNOSIS — M47816 Spondylosis without myelopathy or radiculopathy, lumbar region: Secondary | ICD-10-CM | POA: Diagnosis not present

## 2020-10-01 DIAGNOSIS — Z8616 Personal history of COVID-19: Secondary | ICD-10-CM | POA: Diagnosis not present

## 2020-10-01 DIAGNOSIS — R413 Other amnesia: Secondary | ICD-10-CM | POA: Diagnosis not present

## 2020-10-03 DIAGNOSIS — H6122 Impacted cerumen, left ear: Secondary | ICD-10-CM | POA: Diagnosis not present

## 2020-10-03 DIAGNOSIS — J342 Deviated nasal septum: Secondary | ICD-10-CM | POA: Diagnosis not present

## 2020-10-03 DIAGNOSIS — G4733 Obstructive sleep apnea (adult) (pediatric): Secondary | ICD-10-CM | POA: Diagnosis not present

## 2020-10-03 DIAGNOSIS — J310 Chronic rhinitis: Secondary | ICD-10-CM | POA: Diagnosis not present

## 2020-10-03 DIAGNOSIS — F438 Other reactions to severe stress: Secondary | ICD-10-CM | POA: Diagnosis not present

## 2020-10-08 ENCOUNTER — Other Ambulatory Visit: Payer: Self-pay

## 2020-10-08 ENCOUNTER — Ambulatory Visit (INDEPENDENT_AMBULATORY_CARE_PROVIDER_SITE_OTHER): Payer: Medicare Other | Admitting: Family Medicine

## 2020-10-08 ENCOUNTER — Telehealth: Payer: Self-pay | Admitting: Family Medicine

## 2020-10-08 DIAGNOSIS — I10 Essential (primary) hypertension: Secondary | ICD-10-CM

## 2020-10-08 DIAGNOSIS — E669 Obesity, unspecified: Secondary | ICD-10-CM | POA: Diagnosis not present

## 2020-10-08 DIAGNOSIS — F419 Anxiety disorder, unspecified: Secondary | ICD-10-CM | POA: Diagnosis not present

## 2020-10-08 DIAGNOSIS — E785 Hyperlipidemia, unspecified: Secondary | ICD-10-CM | POA: Diagnosis not present

## 2020-10-08 DIAGNOSIS — G894 Chronic pain syndrome: Secondary | ICD-10-CM | POA: Diagnosis not present

## 2020-10-08 DIAGNOSIS — R7303 Prediabetes: Secondary | ICD-10-CM | POA: Diagnosis not present

## 2020-10-08 DIAGNOSIS — E66811 Obesity, class 1: Secondary | ICD-10-CM

## 2020-10-08 DIAGNOSIS — F4310 Post-traumatic stress disorder, unspecified: Secondary | ICD-10-CM | POA: Diagnosis not present

## 2020-10-08 MED ORDER — OZEMPIC (1 MG/DOSE) 4 MG/3ML ~~LOC~~ SOPN: 1.0000 mg | PEN_INJECTOR | SUBCUTANEOUS | 1 refills | Status: DC

## 2020-10-08 NOTE — Assessment & Plan Note (Signed)
Discussed that a single value into the diabetic range does not mean she has diabetes at this time.  We will need to recheck in about 2 and half months.  She will work on diet and exercise changes.  She will continue to titrate up on the Ozempic for her weight.

## 2020-10-08 NOTE — Assessment & Plan Note (Signed)
At goal.  She will continue amlodipine 5 mg once daily, losartan 100 mg daily, and carvedilol 3.125 mg twice daily.

## 2020-10-08 NOTE — Assessment & Plan Note (Signed)
Slight increase in cholesterol on her last check.  She will remain on Lipitor 40 mg once daily.  I did counsel her on monitoring her diet and increasing her activity level.

## 2020-10-08 NOTE — Progress Notes (Signed)
Tommi Rumps, MD Phone: [phone]  Nicole Washington is a 70 y.o. female who presents today for f/u.  HYPERTENSION Disease Monitoring Home BP Monitoring similar to today Chest pain- no    Dyspnea- no Medications Compliance-  taking amlodipine, losartan, coreg. Edema- no  Prediabetes: Recent A1c bumped up to 6.6.  Patient has been on Ozempic 0.5 mg once weekly for about 3 weeks.  No polyuria or polydipsia.  She is eating a low carbohydrate diet.  She is eating a protein shake, meats, eggs, fruits, and vegetables.  She has not been exercising.  She has a hard time motivating herself to do this.  She remains on Lipitor.  She did have COVID-19 and notes she has fully recovered.    Social History   Tobacco Use  Smoking Status Former   Types: Cigarettes   Quit date: 12/20/1989   Years since quitting: 30.8  Smokeless Tobacco Never    Current Outpatient Medications on File Prior to Visit  Medication Sig Dispense Refill   amLODipine (NORVASC) 5 MG tablet TAKE 1 TABLET BY MOUTH EVERY DAY 90 tablet 1   aspirin EC 81 MG tablet Take 81 mg by mouth daily.     atorvastatin (LIPITOR) 40 MG tablet TAKE 1 TABLET BY MOUTH EVERY DAY 90 tablet 1   carbamazepine (TEGRETOL) 200 MG tablet 1.5 in am and 2 pills qhs     carvedilol (COREG) 3.125 MG tablet TAKE 1 TABLET (3.125 MG TOTAL) BY MOUTH 2 (TWO) TIMES DAILY WITH A MEAL. 180 tablet 1   clonazePAM (KLONOPIN) 0.5 MG tablet TAKE 2 TABLETS BY MOUTH DAILY AS NEEDED FOR ANXIETY 60 tablet 1   DULoxetine (CYMBALTA) 60 MG capsule Take 60 mg by mouth 2 (two) times daily.     losartan (COZAAR) 50 MG tablet Take 1 tablet (50 mg total) by mouth daily. 90 tablet 3   omeprazole (PRILOSEC) 20 MG capsule TAKE 1 CAPSULE BY MOUTH EVERY DAY 90 capsule 1   pregabalin (LYRICA) 50 MG capsule Take 150 mg by mouth 2 (two) times daily.     sertraline (ZOLOFT) 50 MG tablet Take 50 mg by mouth daily.     benzonatate (TESSALON PERLES) 100 MG capsule Take 1-2 tabs TID prn  cough (Patient not taking: Reported on 10/08/2020) 30 capsule 0   lamoTRIgine (LAMICTAL) 25 MG tablet Take 200 mg by mouth 2 (two) times daily. (Patient not taking: Reported on 10/08/2020)     ondansetron (ZOFRAN) 4 MG tablet Take 1 tablet (4 mg total) by mouth every 8 (eight) hours as needed. (Patient not taking: Reported on 10/08/2020) 40 tablet 0   traMADol (ULTRAM) 50 MG tablet Take 50 mg by mouth. (Patient not taking: Reported on 10/08/2020)     No current facility-administered medications on file prior to visit.     ROS see history of present illness  Objective  Physical Exam Vitals:   10/08/20 1211  BP: 128/80  Pulse: 94  Temp: 98.5 F (36.9 C)  SpO2: 96%    BP Readings from Last 3 Encounters:  10/08/20 128/80  09/08/20 (!) 150/93  08/29/20 (!) 146/88   Wt Readings from Last 3 Encounters:  10/08/20 185 lb 9.6 oz (84.2 kg)  09/08/20 186 lb (84.4 kg)  08/29/20 187 lb 3.2 oz (84.9 kg)    Physical Exam Constitutional:      General: She is not in acute distress.    Appearance: She is not diaphoretic.  Cardiovascular:     Rate and Rhythm: Normal  rate and regular rhythm.     Heart sounds: Normal heart sounds.  Pulmonary:     Effort: Pulmonary effort is normal.     Breath sounds: Normal breath sounds.  Skin:    General: Skin is warm and dry.  Neurological:     Mental Status: She is alert.     Assessment/Plan: Please see individual problem list.  Problem List Items Addressed This Visit     Hyperlipidemia    Slight increase in cholesterol on her last check.  She will remain on Lipitor 40 mg once daily.  I did counsel her on monitoring her diet and increasing her activity level.      Hypertension    At goal.  She will continue amlodipine 5 mg once daily, losartan 100 mg daily, and carvedilol 3.125 mg twice daily.      Obesity (BMI 30.0-34.9)    Encouraged continued dietary changes.  Discussed adding in exercise at least 2 days a week on a schedule.  We will  have her titrate up to Ozempic 1 mg once weekly after she completes a full 4 weeks of the 0.5 mg dose.  She will let us know if she develops any nausea or abdominal pain with this.  We will see her back in about 2-1/2 months.      Prediabetes    Discussed that a single value into the diabetic range does not mean she has diabetes at this time.  We will need to recheck in about 2 and half months.  She will work on diet and exercise changes.  She will continue to titrate up on the Ozempic for her weight.       Return in about 11 weeks (around 12/24/2020) for weight.  This visit occurred during the SARS-CoV-2 public health emergency.  Safety protocols were in place, including screening questions prior to the visit, additional usage of staff PPE, and extensive cleaning of exam room while observing appropriate contact time as indicated for disinfecting solutions.    Tommi Rumps, MD [place]

## 2020-10-08 NOTE — Telephone Encounter (Signed)
Patient calling back in about her protein powder disused during her visit. Wanting to know If he wanted her to watch our for the cholesterol or the carbs. States she could not remember.   Please advise

## 2020-10-08 NOTE — Patient Instructions (Signed)
Nice to see you. When you have finished the 4th week of the ozempic 0.5 mg dose you will start on the 1 mg dose the following week.  If you develop nausea with this please let us know. Please try to start walking on the treadmill 2 days a week.  It may be helpful to build this into your schedule.

## 2020-10-08 NOTE — Assessment & Plan Note (Signed)
Encouraged continued dietary changes.  Discussed adding in exercise at least 2 days a week on a schedule.  We will have her titrate up to Ozempic 1 mg once weekly after she completes a full 4 weeks of the 0.5 mg dose.  She will let us know if she develops any nausea or abdominal pain with this.  We will see her back in about 2-1/2 months.

## 2020-10-09 ENCOUNTER — Other Ambulatory Visit: Payer: Self-pay | Admitting: Family Medicine

## 2020-10-09 DIAGNOSIS — F4312 Post-traumatic stress disorder, chronic: Secondary | ICD-10-CM | POA: Diagnosis not present

## 2020-10-09 DIAGNOSIS — F411 Generalized anxiety disorder: Secondary | ICD-10-CM | POA: Diagnosis not present

## 2020-10-09 DIAGNOSIS — F5105 Insomnia due to other mental disorder: Secondary | ICD-10-CM | POA: Diagnosis not present

## 2020-10-09 DIAGNOSIS — F39 Unspecified mood [affective] disorder: Secondary | ICD-10-CM | POA: Diagnosis not present

## 2020-10-09 NOTE — Telephone Encounter (Signed)
I want her to look at the cholesterol and fat content of her protein powder.

## 2020-10-10 ENCOUNTER — Telehealth: Payer: Self-pay

## 2020-10-10 DIAGNOSIS — E669 Obesity, unspecified: Secondary | ICD-10-CM

## 2020-10-10 NOTE — Telephone Encounter (Signed)
Refilled: 06/10/2020. Last OV: 10/08/2020 Next OV: 12/24/2020

## 2020-10-10 NOTE — Telephone Encounter (Signed)
I called and spoke with the patient and informed her that she is to watch for the cholesterol and fat content of her  protein powder and she understood.  Torry Adamczak,cma

## 2020-10-10 NOTE — Telephone Encounter (Signed)
Pt has questions concerning her ozempic and states that her dosage is messed up. Please advise

## 2020-10-11 NOTE — Telephone Encounter (Signed)
Reviewed question. Since original Ozempic script was "0.25 mg x 4 weeks then 0.5 mg" the pharmacy kept filling the sig with those instructions, so she was confused if she was supposed to keep fluctuating between 0.25 and 0.5 mg.   She is taking 0.5 mg and she says she has 3 weeks of 0.5 mg left. Advised to continue this supply until finished, then increase to 1 mg as directed by PCP. She asked that the Ozempic 1 mg script be re-sent to [facility].   She mentioned that she uses [facility] to fill Ozempic at [facility]. Still expensive. May qualify for assistance, may have paid through her Part D deductible. She is amenable to a referral to me to discuss. Completed consent as below. Scheduled appointment next Friday.     Ms. Junker was given information about Care Management services today including:  CCM service includes personalized support from designated clinical staff supervised by the physician, including individualized plan of care and coordination with other care providers 24/7 contact phone numbers for assistance for urgent and routine care needs. Service will only be billed when office clinical staff spend 20 minutes or more in a month to coordinate care. Only one practitioner may furnish and bill the service in a calendar month. The patient may stop CCM services at amy time (effective at the end of the month) by phone call to the office staff. The patient will be responsible for cost sharing (co-pay) or up to 20% of the service fee (after annual deductible is met)  Patient agreed to services and verbal consent obtained.

## 2020-10-11 NOTE — Telephone Encounter (Signed)
I informed the patient that you would call her back to discuss her Ozempic dosage.  Thank you. I didn't want to tell her wrong.    Tannah Dreyfuss,cma

## 2020-10-11 NOTE — Telephone Encounter (Signed)
Referral placed.

## 2020-10-14 ENCOUNTER — Other Ambulatory Visit: Payer: Self-pay

## 2020-10-14 MED ORDER — OZEMPIC (1 MG/DOSE) 4 MG/3ML ~~LOC~~ SOPN: 1.0000 mg | PEN_INJECTOR | SUBCUTANEOUS | 1 refills | Status: DC

## 2020-10-15 DIAGNOSIS — F438 Other reactions to severe stress: Secondary | ICD-10-CM | POA: Diagnosis not present

## 2020-10-18 ENCOUNTER — Ambulatory Visit (INDEPENDENT_AMBULATORY_CARE_PROVIDER_SITE_OTHER): Payer: Medicare Other | Admitting: Pharmacist

## 2020-10-18 DIAGNOSIS — F419 Anxiety disorder, unspecified: Secondary | ICD-10-CM | POA: Diagnosis not present

## 2020-10-18 DIAGNOSIS — I10 Essential (primary) hypertension: Secondary | ICD-10-CM

## 2020-10-18 DIAGNOSIS — F32A Depression, unspecified: Secondary | ICD-10-CM

## 2020-10-18 DIAGNOSIS — E669 Obesity, unspecified: Secondary | ICD-10-CM

## 2020-10-18 DIAGNOSIS — I2510 Atherosclerotic heart disease of native coronary artery without angina pectoris: Secondary | ICD-10-CM | POA: Diagnosis not present

## 2020-10-18 DIAGNOSIS — E785 Hyperlipidemia, unspecified: Secondary | ICD-10-CM | POA: Diagnosis not present

## 2020-10-18 DIAGNOSIS — F4310 Post-traumatic stress disorder, unspecified: Secondary | ICD-10-CM

## 2020-10-18 NOTE — Chronic Care Management (AMB) (Signed)
Chronic Care Management Pharmacy Note  10/18/2020 Name:  Nicole Washington MRN:  [id number] DOB:  20-Oct-1950   Subjective: Nicole Washington is an 70 y.o. year old female who is a primary patient of Washington, Nicole Adam, MD.  The CCM team was consulted for assistance with disease management and care coordination needs.    Engaged with patient by telephone for initial visit in response to provider referral for pharmacy case management and/or care coordination services.   Consent to Services:  The patient was given information about Chronic Care Management services, agreed to services, and gave verbal consent prior to initiation of services.  Please see initial visit note for detailed documentation.   Patient Care Team: Nicole Haven, MD as PCP - General (Family Medicine)    Objective:  Lab Results  Component Value Date   CREATININE 0.79 09/18/2020   CREATININE 0.91 04/30/2020   CREATININE 0.91 09/18/2019    Lab Results  Component Value Date   HGBA1C 6.6 (H) 09/18/2020   Last diabetic Eye exam: No results found for: HMDIABEYEEXA  Last diabetic Foot exam: No results found for: HMDIABFOOTEX      Component Value Date/Time   CHOL 202 (H) 09/18/2020 0956   TRIG 156.0 (H) 09/18/2020 0956   HDL 74.30 09/18/2020 0956   CHOLHDL 3 09/18/2020 0956   VLDL 31.2 09/18/2020 0956   LDLCALC 96 09/18/2020 0956    Hepatic Function Latest Ref Rng & Units 09/18/2020 03/20/2019 11/15/2018  Total Protein 6.0 - 8.3 g/dL 7.1 7.8 7.2  Albumin 3.5 - 5.2 g/dL 4.4 4.4 4.5  AST 0 - 37 U/L _0 ALT 0 - 35 U/L _1 Alk Phosphatase 39 - 117 U/L 109 117 126(H)  Total Bilirubin 0.2 - 1.2 mg/dL 0.5 0.5 0.3  Bilirubin, Direct 0.0 - 0.3 mg/dL - - -    Lab Results  Component Value Date/Time   TSH 2.31 04/30/2020 01:04 PM   TSH 1.74 09/18/2019 01:42 PM   FREET4 0.67 09/18/2019 01:42 PM   FREET4 0.72 09/16/2018 11:27 AM    CBC Latest Ref Rng & Units 09/18/2020 11/15/2018 01/19/2018  WBC 4.0 - 10.5  K/uL 6.1 7.9 7.2  Hemoglobin 12.0 - 15.0 g/dL 13.4 13.2 13.1  Hematocrit 36.0 - 46.0 % 40.0 39.9 39.5  Platelets 150.0 - 400.0 K/uL 361.0 373 336.0    Lab Results  Component Value Date/Time   VD25OH 37.89 06/02/2019 02:02 PM    Clinical ASCVD: Yes  The 10-year ASCVD risk score Mikey Bussing DC Jr., et al., 2013) is: 24.4%   Values used to calculate the score:     Age: 59 years     Sex: Female     Is Non-Hispanic African American: No     Diabetic: Yes     Tobacco smoker: No     Systolic Blood Pressure: 916 mmHg     Is BP treated: Yes     HDL Cholesterol: 74.3 mg/dL     Total Cholesterol: 202 mg/dL     Social History   Tobacco Use  Smoking Status Former   Types: Cigarettes   Quit date: 12/20/1989   Years since quitting: 30.8  Smokeless Tobacco Never   BP Readings from Last 3 Encounters:  10/08/20 128/80  09/08/20 (!) 150/93  08/29/20 (!) 146/88   Pulse Readings from Last 3 Encounters:  10/08/20 94  09/08/20 92  08/29/20 84   Wt Readings from Last 3 Encounters:  10/08/20 185 lb 9.6  oz (84.2 kg)  09/08/20 186 lb (84.4 kg)  08/29/20 187 lb 3.2 oz (84.9 kg)    Assessment: Review of patient past medical history, allergies, medications, health status, including review of consultants reports, laboratory and other test data, was performed as part of comprehensive evaluation and provision of chronic care management services.   SDOH:  (Social Determinants of Health) assessments and interventions performed:  SDOH Interventions    Flowsheet Row Most Recent Value  SDOH Interventions   Financial Strain Interventions Intervention Not Indicated       CCM Care Plan  Allergies  Allergen Reactions   Codeine Itching   Tylenol [Acetaminophen] Itching   Pneumovax 23 [Pneumococcal Vac Polyvalent] Swelling    Patient had the vaccine on 09/18/2019 and stated her arm is swollen as large as a orange and red and sore.  Nicole Washington,cma     Medications Reviewed Today     Reviewed by De Hollingshead, RPH-CPP (Pharmacist) on 10/18/20 at 1155  Med List Status: <None>   Medication Order Taking? Sig Documenting Provider Last Dose Status Informant  amLODipine (NORVASC) 5 MG tablet [id number] Yes TAKE 1 TABLET BY MOUTH EVERY DAY Nicole Haven, MD Taking Active   aspirin EC 81 MG tablet [id number] No Take 81 mg by mouth daily.  Patient not taking: Reported on 10/18/2020   [provider] Not Taking Active   atorvastatin (LIPITOR) 40 MG tablet [id number] Yes TAKE 1 TABLET BY MOUTH EVERY DAY Nicole Haven, MD Taking Active   carbamazepine (TEGRETOL) 100 MG chewable tablet [id number] Yes Chew 150 mg by mouth 2 (two) times daily. [provider] Taking Active   carvedilol (COREG) 3.125 MG tablet [id number] Yes TAKE 1 TABLET (3.125 MG TOTAL) BY MOUTH 2 (TWO) TIMES DAILY WITH A MEAL. Nicole Haven, MD Taking Active   celecoxib (CELEBREX) 200 MG capsule [id number] Yes Take 200 mg by mouth 2 (two) times daily as needed. [provider] Taking Active   clonazePAM (KLONOPIN) 0.5 MG tablet [id number] Yes TAKE TWO TABLETS BY MOUTH DAILY AS NEEDED FOR ANXIETY Nicole Haven, MD Taking Active   DULoxetine (CYMBALTA) 60 MG capsule [id number] Yes Take 60 mg by mouth 2 (two) times daily. [provider] Taking Active   losartan (COZAAR) 50 MG tablet [id number] Yes Take 1 tablet (50 mg total) by mouth daily. McLean-Scocuzza, Nino Glow, MD Taking Active   omeprazole (PRILOSEC) 20 MG capsule [id number] Yes TAKE 1 CAPSULE BY MOUTH EVERY DAY Nicole Haven, MD Taking Active   ondansetron (ZOFRAN) 4 MG tablet [id number] No Take 1 tablet (4 mg total) by mouth every 8 (eight) hours as needed.  Patient not taking: No sig reported   McLean-Scocuzza, Nino Glow, MD Not Taking Active   pregabalin (LYRICA) 50 MG capsule [id number] Yes Take 150 mg by mouth 2 (two) times daily. [provider] Taking Active   Semaglutide, 1 MG/DOSE, (OZEMPIC, 1 MG/DOSE,) 4 MG/3ML SOPN  [id number] Yes Inject 1 mg into the skin once a week. Nicole Haven, MD Taking Active            Med Note Darnelle Maffucci, Arville Lime   Fri Oct 18, 2020 11:06 AM) Completing 0.5 mg weekly  sertraline (ZOLOFT) 100 MG tablet [id number] Yes Take 100 mg by mouth daily. [provider] Taking Active   Discontinued 10/18/20 1155 (Completed Course)   traZODone (DESYREL) 100 MG tablet [id number] Yes Take 100 mg by mouth at bedtime. [provider] Taking Active  Patient Active Problem List   Diagnosis Date Noted   Prediabetes 08/14/2020   Tremor 11/01/2019   Elevated glucose 09/18/2019   Lump in neck 07/28/2019   Vitamin D deficiency 05/12/2019   Obesity (BMI 30.0-34.9) 05/12/2019   Anxiety and depression 01/17/2019   Subclinical hypothyroidism 09/16/2018   GERD (gastroesophageal reflux disease) 09/12/2018   Tingling 06/21/2018   Weight gain due to medication 05/31/2018   Elevated alkaline phosphatase level 05/31/2018   Atypical chest pain 03/11/2018   Knee pain 03/11/2018   Itching 01/19/2018   Tendon nodule 11/04/2017   Memory difficulty 05/03/2017   Twitching 05/03/2017   Allergic rhinitis 05/03/2017   Hot flashes due to menopause 02/02/2017   CAD (coronary artery disease) 02/02/2017   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   Chronic low back pain 11/13/2016   Hypertension 10/27/2016   Hyperlipidemia 10/27/2016   PTSD (post-traumatic stress disorder) 10/27/2016    Immunization History  Administered Date(s) Administered   Fluad Quad(high Dose 65+) 11/01/2019   Hep A / Hep B 11/25/2018   Hepatitis B 12/21/2018   Influenza, High Dose Seasonal PF 02/02/2017   Influenza,inj,Quad PF,6+ Mos 11/03/2017   PFIZER(Purple Top)SARS-COV-2 Vaccination 04/13/2019, 05/09/2019, 01/16/2020   Pneumococcal Conjugate-13 11/03/2017   Pneumococcal Polysaccharide-23 09/18/2019    Conditions to be addressed/monitored: Anxiety and Depression, Prediabetes  Care Plan :  Medication Management  Updates made by De Hollingshead, RPH-CPP since 10/18/2020 12:00 AM     Problem: Prediabetes      Long-Range Goal: Disease Progression Prevention   Start Date: 10/18/2020  This Visit's Progress: On track  Priority: High  Note:   Current Barriers:  Unable to achieve control of weight   Pharmacist Clinical Goal(s):  Over the next 90 days, patient will achieve improvement in weight through collaboration with PharmD and provider.   Interventions: 1:1 collaboration with Nicole Haven, MD regarding development and update of comprehensive plan of care as evidenced by provider attestation and co-signature Inter-disciplinary care team collaboration (see longitudinal plan of care) Comprehensive medication review performed; medication list updated in electronic medical record  Pre-diabetes, Obesity: Controlled per A1c, but with desire for improved weight management; current treatment: Ozempic 0.5 mg x 6 week;  Current meal patterns: breakfast: protein shake; lunch: ham and cheese with lettuce on low carb tortilla, yogurt, ; dinner: BBQ chicken w/o skin; butter beans, cream style corn; snacks: cheese and crackers; BLTs, low carb tortilla; drinks: water; avoiding pasta, rice, potatoes, bread Current exercise: little physical activity, limited by pain; Monday went to the gym for 20 minutes; setting goal of twice weekly They note that the cop[facility] is several hundred dollars at [facility], but is ~$120 at [employer] with [facility]. They confirmed that patient has met her deductible and is in regular coverage phase, has not hit coverage gap yet. They have not tried to pick up the 1 mg dose yet. Encouraged to contact Kristopher Oppenheim to have them run the prescription under insurance. Encouraged to call me if still cost concerns or if there are stocking issues that would prevent her from getting the 1 mg script when she is ready to increase. Complete supply of Ozempic 0.5 mg, then increase to  Ozempic 1 mg weekly.   Hypertension, CAD: Controlled per last office visit; current treatment: losartan 50 mg, carvedilol 3.125 mg BID, amlodipine 10 mg  Current home readings: did not discuss today Recommended to continue current regimen at this time. Will discuss home monitoring moving forward.   Hyperlipidemia: Uncontrolled, consider more  stringent goal LDL <70 given diagnosis of CAD ; current treatment: atorvastatin 40 mg daily;  Recommended to continue current regimen at this time. Consider dose increase to 80 mg moving forward  Depression/Anxiety/PTSD with Chronic Pain, Persistent Genital Arousal Disorder Managed by psychiatry; current treatment: sertraline 100 mg daily, duloxetine 60 mg BID, trazodone 100 mg QPM PRN, clonazepam 0.5 mg BID PRN; managed by Dr. Nicolasa Ducking Pain: celecoxib 200 mg BID PRN, pregabalin 150 mg BID Persistent Genital Arousal Disorder: carbamazepine 150 mg BID; managed by OBGYN Dr. Sandford Craze Monitor for s/sx serotonin syndrome with multiple serotonergic agents, including dual SSRI and SNRI. Continue to collaborate w/ Dr. Nicolasa Ducking. Continue to monitor for drug interactions with carbamazepine Recommended to continue current regimen at this time   Patient Goals/Self-Care Activities Over the next 90 days, patient will:  - take medications as prescribed check blood pressure periodically, document, and provide at future appointments  Follow Up Plan: Telephone follow up appointment with care management team member scheduled for: ~ 8 weeks      Medication Assistance: None required.  Patient affirms current coverage meets needs.  Patient's preferred pharmacy [facility]:  [id number] [place], [address]Phone: [phone]: [phone] [facility] [id number] [address]Phone: [phone]:  [phone] [place] [id number]- [place]ina [place]Phone: [phone]: [phone]   Follow Up:  Patient agrees to Care Plan and Follow-up.  Plan: Telephone follow up appointment with care management team member scheduled for:  ~ 8 weeks  Catie TDarnelle Maffucci PharmD, BPaden City COld StationClinical Pharmacist LOccidental Petroleumat [phone]

## 2020-10-18 NOTE — Patient Instructions (Signed)
Visit Information  PATIENT GOALS:  Goals Addressed               This Visit's Progress     Patient Stated     Medication Monitoring (pt-stated)        Patient Goals/Self-Care Activities Over the next 90 days, patient will:  - take medications as prescribed check blood pressure periodically, document, and provide at future appointments        Patient verbalizes understanding of instructions provided today and agrees to view in [place].   Plan: Telephone follow up appointment with care management team member scheduled for:  ~ 8 weeks  Catie Darnelle Maffucci, PharmD, [place], [place] Clinical Pharmacist [employer] at [employer] [phone]

## 2020-10-22 ENCOUNTER — Ambulatory Visit (INDEPENDENT_AMBULATORY_CARE_PROVIDER_SITE_OTHER): Payer: Medicare Other | Admitting: Dermatology

## 2020-10-22 ENCOUNTER — Other Ambulatory Visit: Payer: Self-pay

## 2020-10-22 ENCOUNTER — Encounter: Payer: Self-pay | Admitting: Dermatology

## 2020-10-22 DIAGNOSIS — L738 Other specified follicular disorders: Secondary | ICD-10-CM

## 2020-10-22 DIAGNOSIS — L814 Other melanin hyperpigmentation: Secondary | ICD-10-CM

## 2020-10-22 DIAGNOSIS — L578 Other skin changes due to chronic exposure to nonionizing radiation: Secondary | ICD-10-CM

## 2020-10-22 DIAGNOSIS — D2261 Melanocytic nevi of right upper limb, including shoulder: Secondary | ICD-10-CM | POA: Diagnosis not present

## 2020-10-22 DIAGNOSIS — Z85828 Personal history of other malignant neoplasm of skin: Secondary | ICD-10-CM

## 2020-10-22 DIAGNOSIS — D225 Melanocytic nevi of trunk: Secondary | ICD-10-CM

## 2020-10-22 DIAGNOSIS — D229 Melanocytic nevi, unspecified: Secondary | ICD-10-CM | POA: Diagnosis not present

## 2020-10-22 DIAGNOSIS — L820 Inflamed seborrheic keratosis: Secondary | ICD-10-CM | POA: Diagnosis not present

## 2020-10-22 DIAGNOSIS — L570 Actinic keratosis: Secondary | ICD-10-CM

## 2020-10-22 DIAGNOSIS — L918 Other hypertrophic disorders of the skin: Secondary | ICD-10-CM | POA: Diagnosis not present

## 2020-10-22 DIAGNOSIS — D1800 Hemangioma unspecified site: Secondary | ICD-10-CM

## 2020-10-22 DIAGNOSIS — I2510 Atherosclerotic heart disease of native coronary artery without angina pectoris: Secondary | ICD-10-CM | POA: Diagnosis not present

## 2020-10-22 DIAGNOSIS — Z1283 Encounter for screening for malignant neoplasm of skin: Secondary | ICD-10-CM

## 2020-10-22 DIAGNOSIS — L720 Epidermal cyst: Secondary | ICD-10-CM

## 2020-10-22 DIAGNOSIS — L821 Other seborrheic keratosis: Secondary | ICD-10-CM | POA: Diagnosis not present

## 2020-10-22 MED ORDER — TRETINOIN 0.05 % EX CREA: TOPICAL_CREAM | Freq: Every day | CUTANEOUS | 4 refills | Status: AC

## 2020-10-22 NOTE — Patient Instructions (Addendum)
If you have any questions or concerns for your doctor, please call our main line at [phone] and press option 4 to reach your doctor's medical assistant. If no one answers, please leave a voicemail as directed and we will return your call as soon as possible. Messages left after 4 pm will be answered the following business day.   You may also send [place] a message via [place]. We typically respond to MyChart messages within 1-2 business days.  For prescription refills, please ask your pharmacy to contact our office. Our fax number is [phone].  If you have an urgent issue when the clinic is closed that cannot wait until the next business day, you can page your doctor at the number below.    Please note that while we do our best to be available for urgent issues outside of office hours, we are not available 24/7.   If you have an urgent issue and are unable to reach [place], you may choose to seek medical care at your doctor's office, retail clinic, urgent care center, or emergency room.  If you have a medical emergency, please immediately call 911 or go to the emergency department.  Pager Numbers  - Dr. Nehemiah Massed: [phone]  - Dr. Laurence Ferrari: [phone]  - Dr. Nicole Kindred: [phone]  In the event of inclement weather, please call our main line at [phone] for an update on the status of any delays or closures.  Dermatology Medication Tips: Please keep the boxes that topical medications come in in order to help keep track of the instructions about where and how to use these. Pharmacies typically print the medication instructions only on the boxes and not directly on the medication tubes.   If your medication is too expensive, please contact our office at [phone] option 4 or send [place] a message through [place].   We are unable to tell what your co-pay for medications will be in advance as this is different depending on your insurance coverage. However, we may be able to find a substitute  medication at lower cost or fill out paperwork to get insurance to cover a needed medication.   If a prior authorization is required to get your medication covered by your insurance company, please allow [place] 1-2 business days to complete this process.  Drug prices often vary depending on where the prescription is filled and some pharmacies may offer cheaper prices.  The website www.goodrx.com contains coupons for medications through different pharmacies. The prices here do not account for what the cost may be with help from insurance (it may be cheaper with your insurance), but the website can give you the price if you did not use any insurance.  - You can print the associated coupon and take it with your prescription to the pharmacy.  - You may also stop by our office during regular business hours and pick up a GoodRx coupon card.  - If you need your prescription sent electronically to a different pharmacy, notify our office through [facility] or by phone at [phone] option 4.   Cryotherapy Aftercare  Wash gently with soap and water everyday.   Apply Vaseline and Band-Aid daily until healed.    Seborrheic Keratosis  What causes seborrheic keratoses? Seborrheic keratoses are harmless, common skin growths that first appear during adult life.  As time goes by, more growths appear.  Some people may develop a large number of them.  Seborrheic keratoses appear on both covered and uncovered body parts.  They are  not caused by sunlight.  The tendency to develop seborrheic keratoses can be inherited.  They vary in color from skin-colored to gray, brown, or even black.  They can be either smooth or have a rough, warty surface.   Seborrheic keratoses are superficial and look as if they were stuck on the skin.  Under the microscope this type of keratosis looks like layers upon layers of skin.  That is why at times the top layer may seem to fall off, but the rest of the growth remains and  re-grows.    Treatment Seborrheic keratoses do not need to be treated, but can easily be removed in the office.  Seborrheic keratoses often cause symptoms when they rub on clothing or jewelry.  Lesions can be in the way of shaving.  If they become inflamed, they can cause itching, soreness, or burning.  Removal of a seborrheic keratosis can be accomplished by freezing, burning, or surgery. If any spot bleeds, scabs, or grows rapidly, please return to have it checked, as these can be an indication of a skin cancer.

## 2020-10-22 NOTE — Progress Notes (Addendum)
New Patient Visit  Subjective  Nicole Washington is a 70 y.o. female who presents for the following: Total body skin exam (Hx of BCCs legs,L forehead, hx of SCC face, R hand dorsum) and Seborrheic Keratosis (Legs, back, irritating).   The following portions of the chart were reviewed this encounter and updated as appropriate:       Review of Systems:  No other skin or systemic complaints except as noted in HPI or Assessment and Plan.  Objective  Well appearing patient in no apparent distress; mood and affect are within normal limits.  A full examination was performed including scalp, head, eyes, ears, nose, lips, neck, chest, axillae, abdomen, back, buttocks, bilateral upper extremities, bilateral lower extremities, hands, feet, fingers, toes, fingernails, and toenails. All findings within normal limits unless otherwise noted below.  L lat thigh x 1, L med knee x 1, L med calf x 1, R ant thigh x 1, Total = 4 (4) Erythematous keratotic or waxy stuck-on papule   R ant wrist; Right spinal mid lower back  Right spinal mid lower back 6.39m pink flesh pap  R ant wrist 3.067mbrown speckled waxy pap  face Firm white paps  Left Upper Lip x 1 Pink scaly macule   Assessment & Plan   Lentigines - Scattered tan macules - Due to sun exposure - Benign-appering, observe - Recommend daily broad spectrum sunscreen SPF 30+ to sun-exposed areas, reapply every 2 hours as needed. - Call for any changes - Discussed IPL to face, [facility], info given  Seborrheic Keratoses - Stuck-on, waxy, tan-brown papules and/or plaques  - Benign-appearing - Discussed benign etiology and prognosis. - Observe - Call for any changes - Recommend Amlactin cr qd/bid or [place] rough and bumpy cream  Melanocytic Nevi - Tan-brown and/or pink-flesh-colored symmetric macules and papules - Benign appearing on exam today - Observation - Call clinic for new or  changing moles - Recommend daily use of broad spectrum spf 30+ sunscreen to sun-exposed areas.   Hemangiomas - Red papules - Discussed benign nature - Observe - Call for any changes  Actinic Damage - Chronic condition, secondary to cumulative UV/sun exposure - diffuse scaly erythematous macules with underlying dyspigmentation - Recommend daily broad spectrum sunscreen SPF 30+ to sun-exposed areas, reapply every 2 hours as needed.  - Staying in the shade or wearing long sleeves, sun glasses (UVA+UVB protection) and wide brim hats (4-inch brim around the entire circumference of the hat) are also recommended for sun protection.  - Call for new or changing lesions. - chest Skin cancer screening performed today.  History of Basal Cell Carcinoma of the Skin - No evidence of recurrence today - Recommend regular full body skin exams - Recommend daily broad spectrum sunscreen SPF 30+ to sun-exposed areas, reapply every 2 hours as needed.  - Call if any new or changing lesions are noted between office visits   Inflamed seborrheic keratosis L lat thigh x 1, L med knee x 1, L med calf x 1, R ant thigh x 1, Total = 4  Destruction of lesion - L lat thigh x 1, L med knee x 1, L med calf x 1, R ant thigh x 1, Total = 4  Destruction method: cryotherapy   Informed consent: discussed and consent obtained   Lesion destroyed using liquid nitrogen: Yes   Region frozen until ice ball extended beyond lesion: Yes   Outcome: patient tolerated procedure well with  no complications   Post-procedure details: wound care instructions given   Additional details:  Prior to procedure, discussed risks of blister formation, small wound, skin dyspigmentation, or rare scar following cryotherapy. Recommend Vaseline ointment to treated areas while healing.   Nevus (2) R ant wrist; Right spinal mid lower back  Nevus vs SK Benign-appearing.  Observation.  Call clinic for new or changing moles.  Recommend daily use of  broad spectrum spf 30+ sunscreen to sun-exposed areas.    Mil[place] face  Start Tretinoin 0.05% cr qhs to face   Topical retinoid medications like tretinoin/Retin-A, adapalene/Differin, tazarotene/Fabior, and Epiduo/Epiduo Forte can cause dryness and irritation when first started. Only apply a pea-sized amount to the entire affected area. Avoid applying it around the eyes, edges of mouth and creases at the nose. If you experience irritation, use a good moisturizer first and/or apply the medicine less often. If you are doing well with the medicine, you can increase how often you use it until you are applying every night. Be careful with sun protection while using this medication as it can make you sensitive to the sun. This medicine should not be used by pregnant women.    tretinoin (RETIN-A) 0.05 % cream - face Apply topically at bedtime. Qhs as tolerated to face for bumps  AK (actinic keratosis) Left Upper Lip x 1  Destruction of lesion - Left Upper Lip x 1  Destruction method: cryotherapy   Informed consent: discussed and consent obtained   Lesion destroyed using liquid nitrogen: Yes   Region frozen until ice ball extended beyond lesion: Yes   Outcome: patient tolerated procedure well with no complications   Post-procedure details: wound care instructions given   Additional details:  Prior to procedure, discussed risks of blister formation, small wound, skin dyspigmentation, or rare scar following cryotherapy. Recommend Vaseline ointment to treated areas while healing.   History of Squamous Cell Carcinoma of the Skin - No evidence of recurrence today - Recommend regular full body skin exams - Recommend daily broad spectrum sunscreen SPF 30+ to sun-exposed areas, reapply every 2 hours as needed.  - Call if any new or changing lesions are noted between office visits  Sebaceous Hyperplasia - Small yellow papules with a central dell - Benign - Observe  - Discussed cosmetic ED, $60 for  first lesion and $15 for each additional txted on same day.  Acrochordons (Skin Tags) - Fleshy, skin-colored pedunculated papules - Benign appearing.  - Observe. - If desired, they can be removed with an in office procedure that is not covered by insurance. - Please call the clinic if you notice any new or changing lesions.    Return in about 1 year (around 10/22/2021) for Hx of BCC, Hx of SCC, Hx of AKs.  I, Othelia Pulling, RMA, am acting as scribe for Brendolyn Patty, MD .  Documentation: I have reviewed the above documentation for accuracy and completeness, and I agree with the above.  Brendolyn Patty MD

## 2020-10-23 ENCOUNTER — Ambulatory Visit: Payer: Medicare Other | Admitting: Pharmacist

## 2020-10-23 DIAGNOSIS — I10 Essential (primary) hypertension: Secondary | ICD-10-CM

## 2020-10-23 DIAGNOSIS — E669 Obesity, unspecified: Secondary | ICD-10-CM

## 2020-10-23 DIAGNOSIS — Z8616 Personal history of COVID-19: Secondary | ICD-10-CM | POA: Diagnosis not present

## 2020-10-23 DIAGNOSIS — E785 Hyperlipidemia, unspecified: Secondary | ICD-10-CM

## 2020-10-23 DIAGNOSIS — R413 Other amnesia: Secondary | ICD-10-CM | POA: Diagnosis not present

## 2020-10-23 NOTE — Patient Instructions (Signed)
Visit Information  PATIENT GOALS:  Goals Addressed               This Visit's Progress     Patient Stated     Medication Monitoring (pt-stated)        Patient Goals/Self-Care Activities Over the next 90 days, patient will:  - take medications as prescribed check blood pressure periodically, document, and provide at future appointments         Patient verbalizes understanding of instructions provided today and agrees to view in [place].   Plan: Telephone follow up appointment with care management team member scheduled for:  ~ 6 weeks  Catie Darnelle Maffucci, PharmD, [place], [place] Clinical Pharmacist [employer] at [employer] [phone]

## 2020-10-23 NOTE — Chronic Care Management (AMB) (Signed)
Chronic Care Management Pharmacy Note  10/23/2020 Name:  Nicole Washington MRN:  [id number] DOB:  [birth year]   Subjective: Nicole Washington is an 70 y.o. year old female who is a primary patient of Sonnenberg, Angela Adam, MD.  The CCM team was consulted for assistance with disease management and care coordination needs.    Engaged with patient by telephone for  medication access  in response to provider referral for pharmacy case management and/or care coordination services.   Consent to Services:  The patient was given information about Chronic Care Management services, agreed to services, and gave verbal consent prior to initiation of services.  Please see initial visit note for detailed documentation.   Patient Care Team: Leone Haven, MD as PCP - General (Family Medicine) De Hollingshead, RPH-CPP (Pharmacist)  Objective:  Lab Results  Component Value Date   CREATININE 0.79 09/18/2020   CREATININE 0.91 04/30/2020   CREATININE 0.91 09/18/2019    Lab Results  Component Value Date   HGBA1C 6.6 (H) 09/18/2020   Last diabetic Eye exam: No results found for: HMDIABEYEEXA  Last diabetic Foot exam: No results found for: HMDIABFOOTEX      Component Value Date/Time   CHOL 202 (H) 09/18/2020 0956   TRIG 156.0 (H) 09/18/2020 0956   HDL 74.30 09/18/2020 0956   CHOLHDL 3 09/18/2020 0956   VLDL 31.2 09/18/2020 0956   LDLCALC 96 09/18/2020 0956    Hepatic Function Latest Ref Rng & Units 09/18/2020 03/20/2019 11/15/2018  Total Protein 6.0 - 8.3 g/dL 7.1 7.8 7.2  Albumin 3.5 - 5.2 g/dL 4.4 4.4 4.5  AST 0 - 37 U/L '15 17 20  ' ALT 0 - 35 U/L '20 21 22  ' Alk Phosphatase 39 - 117 U/L 109 117 126(H)  Total Bilirubin 0.2 - 1.2 mg/dL 0.5 0.5 0.3  Bilirubin, Direct 0.0 - 0.3 mg/dL - - -    Lab Results  Component Value Date/Time   TSH 2.31 04/30/2020 01:04 PM   TSH 1.74 09/18/2019 01:42 PM   FREET4 0.67 09/18/2019 01:42 PM   FREET4 0.72 09/16/2018 11:27 AM    CBC Latest Ref Rng & Units  09/18/2020 11/15/2018 01/19/2018  WBC 4.0 - 10.5 K/uL 6.1 7.9 7.2  Hemoglobin 12.0 - 15.0 g/dL 13.4 13.2 13.1  Hematocrit 36.0 - 46.0 % 40.0 39.9 39.5  Platelets 150.0 - 400.0 K/uL 361.0 373 336.0    Lab Results  Component Value Date/Time   VD25OH 37.89 06/02/2019 02:02 PM    Clinical ASCVD: No  The 10-year ASCVD risk score Mikey Bussing DC Jr., et al., 2013) is: 24.4%   Values used to calculate the score:     Age: 63 years     Sex: Female     Is Non-Hispanic African American: No     Diabetic: Yes     Tobacco smoker: No     Systolic Blood Pressure: 370 mmHg     Is BP treated: Yes     HDL Cholesterol: 74.3 mg/dL     Total Cholesterol: 202 mg/dL    Social History   Tobacco Use  Smoking Status Former   Types: Cigarettes   Quit date: 12/20/1989   Years since quitting: 30.8  Smokeless Tobacco Never   BP Readings from Last 3 Encounters:  10/08/20 128/80  09/08/20 (!) 150/93  08/29/20 (!) 146/88   Pulse Readings from Last 3 Encounters:  10/08/20 94  09/08/20 92  08/29/20 84   Wt Readings from Last 3 Encounters:  10/08/20 185 lb 9.6 oz (84.2 kg)  09/08/20 186 lb (84.4 kg)  08/29/20 187 lb 3.2 oz (84.9 kg)    Assessment: Review of patient past medical history, allergies, medications, health status, including review of consultants reports, laboratory and other test data, was performed as part of comprehensive evaluation and provision of chronic care management services.   SDOH:  (Social Determinants of Health) assessments and interventions performed:  SDOH Interventions    Flowsheet Row Most Recent Value  SDOH Interventions   Financial Strain Interventions Intervention Not Indicated       CCM Care Plan  Allergies  Allergen Reactions   Codeine Itching   Tylenol [Acetaminophen] Itching   Pneumovax 23 [Pneumococcal Vac Polyvalent] Swelling    Patient had the vaccine on 09/18/2019 and stated her arm is swollen as large as a orange and red and sore.  [name],cma     Medications  Reviewed Today     Reviewed by Larence Penning, CMA (Certified Medical Assistant) on 10/22/20 at 71  Med List Status: <None>   Medication Order Taking? Sig Documenting Provider Last Dose Status Informant  amLODipine (NORVASC) 5 MG tablet [id number] No TAKE 1 TABLET BY MOUTH EVERY DAY Leone Haven, MD Taking Active   aspirin EC 81 MG tablet [id number] No Take 81 mg by mouth daily.  Patient not taking: Reported on 10/18/2020   [provider] Not Taking Active   atorvastatin (LIPITOR) 40 MG tablet [id number] No TAKE 1 TABLET BY MOUTH EVERY DAY Leone Haven, MD Taking Active   carbamazepine (TEGRETOL) 100 MG chewable tablet [id number] No Chew 150 mg by mouth 2 (two) times daily. [provider] Taking Active   carvedilol (COREG) 3.125 MG tablet [id number] No TAKE 1 TABLET (3.125 MG TOTAL) BY MOUTH 2 (TWO) TIMES DAILY WITH A MEAL. Leone Haven, MD Taking Active   celecoxib (CELEBREX) 200 MG capsule [id number] No Take 200 mg by mouth 2 (two) times daily as needed. [provider] Taking Active   clonazePAM (KLONOPIN) 0.5 MG tablet [id number] No TAKE TWO TABLETS BY MOUTH DAILY AS NEEDED FOR ANXIETY Leone Haven, MD Taking Active   DULoxetine (CYMBALTA) 60 MG capsule [id number] No Take 60 mg by mouth 2 (two) times daily. [provider] Taking Active   losartan (COZAAR) 50 MG tablet [id number] No Take 1 tablet (50 mg total) by mouth daily. McLean-Scocuzza, Nino Glow, MD Taking Active   omeprazole (PRILOSEC) 20 MG capsule [id number] No TAKE 1 CAPSULE BY MOUTH EVERY DAY Leone Haven, MD Taking Active   ondansetron (ZOFRAN) 4 MG tablet [id number] No Take 1 tablet (4 mg total) by mouth every 8 (eight) hours as needed.  Patient not taking: No sig reported   McLean-Scocuzza, Nino Glow, MD Not Taking Active   pregabalin (LYRICA) 150 MG capsule [id number] No Take 150 mg by mouth 2 (two) times daily. [provider] Taking Active   Semaglutide, 1  MG/DOSE, (OZEMPIC, 1 MG/DOSE,) 4 MG/3ML SOPN [id number] No Inject 1 mg into the skin once a week. Leone Haven, MD Taking Active            Med Note Darnelle Maffucci, Arville Lime   Fri Oct 18, 2020 11:06 AM) Completing 0.5 mg weekly  sertraline (ZOLOFT) 100 MG tablet [id number] No Take 100 mg by mouth daily. [provider] Taking Active   traZODone (DESYREL) 100 MG tablet [id number] No Take 100 mg by mouth at bedtime. [provider] Taking Active  Patient Active Problem List   Diagnosis Date Noted   Prediabetes 08/14/2020   Tremor 11/01/2019   Elevated glucose 09/18/2019   Lump in neck 07/28/2019   Vitamin D deficiency 05/12/2019   Obesity (BMI 30.0-34.9) 05/12/2019   Anxiety and depression 01/17/2019   Subclinical hypothyroidism 09/16/2018   GERD (gastroesophageal reflux disease) 09/12/2018   Tingling 06/21/2018   Weight gain due to medication 05/31/2018   Elevated alkaline phosphatase level 05/31/2018   Atypical chest pain 03/11/2018   Knee pain 03/11/2018   Itching 01/19/2018   Tendon nodule 11/04/2017   Memory difficulty 05/03/2017   Twitching 05/03/2017   Allergic rhinitis 05/03/2017   Hot flashes due to menopause 02/02/2017   CAD (coronary artery disease) 02/02/2017   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   Chronic low back pain 11/13/2016   Hypertension 10/27/2016   Hyperlipidemia 10/27/2016   PTSD (post-traumatic stress disorder) 10/27/2016    Immunization History  Administered Date(s) Administered   Fluad Quad(high Dose 65+) 11/01/2019   Hep A / Hep B 11/25/2018   Hepatitis B 12/21/2018   Influenza, High Dose Seasonal PF 02/02/2017   Influenza,inj,Quad PF,6+ Mos 11/03/2017   PFIZER(Purple Top)SARS-COV-2 Vaccination 04/13/2019, 05/09/2019, 01/16/2020   Pneumococcal Conjugate-13 11/03/2017   Pneumococcal Polysaccharide-23 09/18/2019    Conditions to be addressed/monitored: prediabetes  Care Plan : Medication Management  Updates made  by De Hollingshead, RPH-CPP since 10/23/2020 12:00 AM     Problem: Prediabetes      Long-Range Goal: Disease Progression Prevention   Start Date: 10/18/2020  This Visit's Progress: On track  Recent Progress: On track  Priority: High  Note:   Current Barriers:  Unable to achieve control of weight   Pharmacist Clinical Goal(s):  Over the next 90 days, patient will achieve improvement in weight through collaboration with PharmD and provider.   Interventions: 1:1 collaboration with Leone Haven, MD regarding development and update of comprehensive plan of care as evidenced by provider attestation and co-signature Inter-disciplinary care team collaboration (see longitudinal plan of care) Comprehensive medication review performed; medication list updated in electronic medical record  Pre-diabetes, Obesity: Controlled per A1c, but with desire for improved weight management; current treatment: Ozempic 0.5 mg x 6 week, plan to increase to 1 mg weekly when supply is completed Current meal patterns: breakfast: protein shake; lunch: ham and cheese with lettuce on low carb tortilla, yogurt, ; dinner: BBQ chicken w/o skin; butter beans, cream style corn; snacks: cheese and crackers; BLTs, low carb tortilla; drinks: water; avoiding pasta, rice, potatoes, bread Current exercise: little physical activity, limited by pain; Monday went to the gym for 20 minutes; setting goal of twice weekly They call today to note that copay for Ozempic was >$500. Called the pharmacy. This was for a 3 month supply, a 1 month supply would be ~$150. Encouraged patient to call [facility] to ask to fill for a 1 month supply. They verbalized understanding and ability to pay that copay.   Hypertension, CAD: Controlled per last office visit; current treatment: losartan 50 mg, carvedilol 3.125 mg BID, amlodipine 10 mg  Previously recommended to continue current regimen at this time. Will discuss home monitoring moving  forward.   Hyperlipidemia: Uncontrolled, consider more stringent goal LDL <70 given diagnosis of CAD ; current treatment: atorvastatin 40 mg daily;  Previously recommended to continue current regimen at this time. Consider dose increase to 80 mg moving forward  Depression/Anxiety/PTSD with Chronic Pain, Persistent Genital Arousal Disorder Managed by psychiatry; current treatment: sertraline 100  mg daily, duloxetine 60 mg BID, trazodone 100 mg QPM PRN, clonazepam 0.5 mg BID PRN; managed by Dr. Nicolasa Ducking Pain: celecoxib 200 mg BID PRN, pregabalin 150 mg BID Persistent Genital Arousal Disorder: carbamazepine 150 mg BID; managed by OBGYN Dr. Sandford Craze Monitor for s/sx serotonin syndrome with multiple serotonergic agents, including dual SSRI and SNRI. Continue to collaborate w/ Dr. Nicolasa Ducking. Continue to monitor for drug interactions with carbamazepine Previously recommended to continue current regimen at this time   Patient Goals/Self-Care Activities Over the next 90 days, patient will:  - take medications as prescribed check blood pressure periodically, document, and provide at future appointments  Follow Up Plan: Telephone follow up appointment with care management team member scheduled for: ~ 8 weeks as previously scheduled      Medication Assistance: None required.  Patient affirms current coverage meets needs.  Patient's preferred pharmacy is:  [id number] [place], [place]Phone: [phone]: [phone] [id number] [address] [facility]Phone: [phone]: [phone] [id number]- [place]- [address]Phone: [phone]: [phone]  Follow Up:  Patient agrees to Care Plan and Follow-up.  Plan: Telephone follow up  appointment with care management team member scheduled for:  ~ 6 weeks  Catie TDarnelle Maffucci PharmD, [place] CKilaClinical Pharmacist LOcc[facility] [phone]

## 2020-11-05 DIAGNOSIS — R413 Other amnesia: Secondary | ICD-10-CM | POA: Diagnosis not present

## 2020-11-05 DIAGNOSIS — Z8616 Personal history of COVID-19: Secondary | ICD-10-CM | POA: Diagnosis not present

## 2020-11-07 DIAGNOSIS — F438 Other reactions to severe stress: Secondary | ICD-10-CM | POA: Diagnosis not present

## 2020-11-11 ENCOUNTER — Telehealth: Payer: Self-pay | Admitting: Family Medicine

## 2020-11-11 NOTE — Telephone Encounter (Signed)
Symptoms started a few weeks ago. Patient has been using an OTC shampoo for dandruff. States she thinks it may be a fungal issues instead and wants to know if Dr Caryl Bis will send in a prescription shampoo?   Patient wanting to know if this can be sent in? Does she need an appointment? Does she need a dermatology referral?   Please advise

## 2020-11-12 ENCOUNTER — Telehealth: Payer: Self-pay

## 2020-11-12 NOTE — Telephone Encounter (Signed)
She will need an appointment for an evaluation for this.  She can see any available provider for this.

## 2020-11-12 NOTE — Telephone Encounter (Signed)
Patient called regarding a flare in her scalp and being very itchy. She states she is also having a lot of dandruff, which is uncommon. Anything she can do or can be prescribed to help with this?

## 2020-11-13 ENCOUNTER — Ambulatory Visit: Payer: Medicare Other | Admitting: Dermatology

## 2020-11-13 MED ORDER — KETOCONAZOLE 2 % EX SHAM: 1.0000 "application " | MEDICATED_SHAMPOO | CUTANEOUS | 2 refills | Status: DC

## 2020-11-13 NOTE — Telephone Encounter (Signed)
Prescription sent in and patient advised.

## 2020-11-14 NOTE — Telephone Encounter (Signed)
Placed call to pt. Pt states she spoke with her dermatologist and got something prescribed.

## 2020-11-15 DIAGNOSIS — F419 Anxiety disorder, unspecified: Secondary | ICD-10-CM | POA: Diagnosis not present

## 2020-11-15 DIAGNOSIS — I2510 Atherosclerotic heart disease of native coronary artery without angina pectoris: Secondary | ICD-10-CM | POA: Diagnosis not present

## 2020-11-15 DIAGNOSIS — I10 Essential (primary) hypertension: Secondary | ICD-10-CM | POA: Diagnosis not present

## 2020-11-15 DIAGNOSIS — F32A Depression, unspecified: Secondary | ICD-10-CM | POA: Diagnosis not present

## 2020-11-15 DIAGNOSIS — E785 Hyperlipidemia, unspecified: Secondary | ICD-10-CM | POA: Diagnosis not present

## 2020-11-26 DIAGNOSIS — F39 Unspecified mood [affective] disorder: Secondary | ICD-10-CM | POA: Diagnosis not present

## 2020-11-26 DIAGNOSIS — F5105 Insomnia due to other mental disorder: Secondary | ICD-10-CM | POA: Diagnosis not present

## 2020-11-26 DIAGNOSIS — F411 Generalized anxiety disorder: Secondary | ICD-10-CM | POA: Diagnosis not present

## 2020-11-26 DIAGNOSIS — F4312 Post-traumatic stress disorder, chronic: Secondary | ICD-10-CM | POA: Diagnosis not present

## 2020-11-27 DIAGNOSIS — F4389 Other reactions to severe stress: Secondary | ICD-10-CM | POA: Diagnosis not present

## 2020-12-12 ENCOUNTER — Other Ambulatory Visit: Payer: Self-pay | Admitting: Family Medicine

## 2020-12-13 ENCOUNTER — Ambulatory Visit (INDEPENDENT_AMBULATORY_CARE_PROVIDER_SITE_OTHER): Payer: Medicare Other | Admitting: Pharmacist

## 2020-12-13 VITALS — Wt 173.0 lb

## 2020-12-13 DIAGNOSIS — E785 Hyperlipidemia, unspecified: Secondary | ICD-10-CM

## 2020-12-13 DIAGNOSIS — E669 Obesity, unspecified: Secondary | ICD-10-CM

## 2020-12-13 DIAGNOSIS — I10 Essential (primary) hypertension: Secondary | ICD-10-CM

## 2020-12-13 NOTE — Patient Instructions (Signed)
Visit Information  PATIENT GOALS:  Goals Addressed               This Visit's Progress     Patient Stated     COMPLETED: Medication Monitoring (pt-stated)        Patient Goals/Self-Care Activities Over the next 90 days, patient will:  - take medications as prescribed check blood pressure periodically, document, and provide at future appointments         Patient verbalizes understanding of instructions provided today and agrees to view in [place].   Plan: Goals of care met. Closing CCM case.   Catie Darnelle Maffucci, PharmD, [place], [place] Clinical Pharmacist [employer] at [employer] [phone]

## 2020-12-13 NOTE — Chronic Care Management (AMB) (Signed)
Chronic Care Management Pharmacy Note  12/13/2020 Name:  Nicole Washington MRN:  [id number] DOB:  09/02/1950  Subjective: Leasha Goldberger is an 70 y.o. year old female who is a primary patient of Sonnenberg, Angela Adam, MD.  The CCM team was consulted for assistance with disease management and care coordination needs.    Engaged with patient by telephone for follow up visit for pharmacy case management and/or care coordination services.   Objective:  Medications Reviewed Today     Reviewed by Larence Penning, CMA (Certified Medical Assistant) on 10/22/20 at 12  Med List Status: <None>   Medication Order Taking? Sig Documenting Provider Last Dose Status Informant  amLODipine (NORVASC) 5 MG tablet [id number] No TAKE 1 TABLET BY MOUTH EVERY DAY Leone Haven, MD Taking Active   aspirin EC 81 MG tablet [id number] No Take 81 mg by mouth daily.  Patient not taking: Reported on 10/18/2020   [provider] Not Taking Active   atorvastatin (LIPITOR) 40 MG tablet [id number] No TAKE 1 TABLET BY MOUTH EVERY DAY Leone Haven, MD Taking Active   carbamazepine (TEGRETOL) 100 MG chewable tablet [id number] No Chew 150 mg by mouth 2 (two) times daily. [provider] Taking Active   carvedilol (COREG) 3.125 MG tablet [id number] No TAKE 1 TABLET (3.125 MG TOTAL) BY MOUTH 2 (TWO) TIMES DAILY WITH A MEAL. Leone Haven, MD Taking Active   celecoxib (CELEBREX) 200 MG capsule [id number] No Take 200 mg by mouth 2 (two) times daily as needed. [provider] Taking Active   clonazePAM (KLONOPIN) 0.5 MG tablet [id number] No TAKE TWO TABLETS BY MOUTH DAILY AS NEEDED FOR ANXIETY Leone Haven, MD Taking Active   DULoxetine (CYMBALTA) 60 MG capsule [id number] No Take 60 mg by mouth 2 (two) times daily. [provider] Taking Active   losartan (COZAAR) 50 MG tablet [id number] No Take 1 tablet (50 mg total) by mouth daily. McLean-Scocuzza, Nino Glow, MD Taking Active   omeprazole  (PRILOSEC) 20 MG capsule [id number] No TAKE 1 CAPSULE BY MOUTH EVERY DAY Leone Haven, MD Taking Active   ondansetron (ZOFRAN) 4 MG tablet [id number] No Take 1 tablet (4 mg total) by mouth every 8 (eight) hours as needed.  Patient not taking: No sig reported   McLean-Scocuzza, Nino Glow, MD Not Taking Active   pregabalin (LYRICA) 150 MG capsule [id number] No Take 150 mg by mouth 2 (two) times daily. [provider] Taking Active   Semaglutide, 1 MG/DOSE, (OZEMPIC, 1 MG/DOSE,) 4 MG/3ML SOPN [id number] No Inject 1 mg into the skin once a week. Leone Haven, MD Taking Active            Med Note Darnelle Maffucci, Arville Lime   Fri Oct 18, 2020 11:06 AM) Completing 0.5 mg weekly  sertraline (ZOLOFT) 100 MG tablet [id number] No Take 100 mg by mouth daily. [provider] Taking Active   traZODone (DESYREL) 100 MG tablet [id number] No Take 100 mg by mouth at bedtime. [provider] Taking Active            Lab Results  Component Value Date   HGBA1C 6.6 (H) 09/18/2020   Wt Readings from Last 3 Encounters:  12/13/20 173 lb (78.5 kg)  10/08/20 185 lb 9.6 oz (84.2 kg)  09/08/20 186 lb (84.4 kg)     Assessment/Interventions: Review of patient past medical history, allergies, medications, health status, including review of consultants reports, laboratory and other test data, was performed as part  of comprehensive evaluation and provision of chronic care management services.   SDOH:  (Social Determinants of Health) assessments and interventions performed: Yes SDOH Interventions    Flowsheet Row Most Recent Value  SDOH Interventions   Financial Strain Interventions Intervention Not Indicated        CCM Care Plan  Review of patient past medical history, allergies, medications, health status, including review of consultants reports, laboratory and other test data, was performed as part of comprehensive evaluation and provision of chronic care management services.    Conditions to be addressed/monitored:  Prediabetes  Care Plan : Medication Management  Updates made by De Hollingshead, RPH-CPP since 12/13/2020 12:00 AM  Completed 12/13/2020   Problem: Prediabetes Resolved 12/13/2020     Long-Range Goal: Disease Progression Prevention Completed 12/13/2020  Start Date: 10/18/2020  Recent Progress: On track  Priority: High  Note:   Current Barriers:  Unable to achieve control of weight   Pharmacist Clinical Goal(s):  Over the next 90 days, patient will achieve improvement in weight through collaboration with PharmD and provider.   Interventions: 1:1 collaboration with Leone Haven, MD regarding development and update of comprehensive plan of care as evidenced by provider attestation and co-signature Inter-disciplinary care team collaboration (see longitudinal plan of care) Comprehensive medication review performed; medication list updated in electronic medical record  Pre-diabetes, Obesity: Controlled per A1c, but with desire for improved weight management; current treatment: Ozempic 1 mg once weekly  Denies GI side effects.  Current home weight: 172 lbs; baseline weight: 185 (13 lbs down from baseline weight, 7%) Current meal patterns: breakfast: protein shake; lunch: ham and cheese with lettuce on low carb tortilla, yogurt, ; dinner: BBQ chicken w/o skin; butter beans, cream style corn; snacks: cheese and crackers; BLTs, low carb tortilla; drinks: water; avoiding pasta, rice, potatoes, bread Current exercise: little physical activity, limited by pain;  Recommended to continue current regimen at this time.   Hypertension, CAD: Controlled per last office visit; current treatment: losartan 50 mg, carvedilol 3.125 mg BID, amlodipine 10 mg  Previously recommended to continue current regimen at this time.   Hyperlipidemia: Uncontrolled, consider more stringent goal LDL <70 given diagnosis of CAD; current treatment: atorvastatin 40 mg  daily;  Previously recommended to continue current regimen at this time. Consider dose increase to 80 mg moving forward  Depression/Anxiety/PTSD with Chronic Pain, Persistent Genital Arousal Disorder Managed by psychiatry; current treatment: sertraline 100 mg daily, duloxetine 60 mg BID, trazodone 100 mg QPM PRN, clonazepam 0.5 mg BID PRN; managed by Dr. Nicolasa Ducking Pain: celecoxib 200 mg BID PRN, pregabalin 150 mg BID Persistent Genital Arousal Disorder: carbamazepine 150 mg BID; managed by OBGYN Dr. Melba Coon  Monitor for s/sx serotonin syndrome with multiple serotonergic agents, including dual SSRI and SNRI. Continue to collaborate w/ Dr. Nicolasa Ducking. Continue to monitor for drug interactions with carbamazepine Today reports continued concerns with scalp itching. Has used a bottle of ketoconazole shampoo with no relief. Denies any redness, flaking, or anything she can see on her scalp associated with the itching. Wonders what next steps are. Feels it might be related to carbamazepine as this is the most recently added medication that she feels to be associated. Discussed with PCP, due to lack of rash and location, unlikely to be SJS and require emergency evaluation. Advised to talk to Dr. Melba Coon regarding her concerns with carbamazepine, however, patient is not interested in stopping this medication. Advised that she contact Dr. Nicole Kindred dermatology for next steps in treatment. Patient notes  she will call their office today.   Patient Goals/Self-Care Activities Over the next 90 days, patient will:  - take medications as prescribed check blood pressure periodically, document, and provide at future appointments  Follow Up Plan: Goals of pharmacy care met, closing CCM case       Plan: Goals of care met. Closing CCM case.   Catie Darnelle Maffucci, PharmD, [place], [place] Clinical Pharmacist [employer] at [employer] [phone]

## 2020-12-16 DIAGNOSIS — E785 Hyperlipidemia, unspecified: Secondary | ICD-10-CM

## 2020-12-16 DIAGNOSIS — I10 Essential (primary) hypertension: Secondary | ICD-10-CM | POA: Diagnosis not present

## 2020-12-17 ENCOUNTER — Other Ambulatory Visit: Payer: Self-pay

## 2020-12-17 ENCOUNTER — Ambulatory Visit (INDEPENDENT_AMBULATORY_CARE_PROVIDER_SITE_OTHER): Payer: Medicare Other | Admitting: Dermatology

## 2020-12-17 DIAGNOSIS — I2510 Atherosclerotic heart disease of native coronary artery without angina pectoris: Secondary | ICD-10-CM | POA: Diagnosis not present

## 2020-12-17 DIAGNOSIS — L820 Inflamed seborrheic keratosis: Secondary | ICD-10-CM

## 2020-12-17 DIAGNOSIS — L219 Seborrheic dermatitis, unspecified: Secondary | ICD-10-CM | POA: Diagnosis not present

## 2020-12-17 MED ORDER — CLOBETASOL PROPIONATE 0.05 % EX SHAM: MEDICATED_SHAMPOO | CUTANEOUS | 3 refills | Status: DC

## 2020-12-17 NOTE — Progress Notes (Signed)
   Follow-Up Visit   Subjective  Nicole Washington is a 70 y.o. female who presents for the following: Follow-up (Patient reports for several weeks she has some itchy at scalp. Patient has tried several over the counter treatments to use but did not help. Patient reports uses ketoconazole shampoo every day to scalp. Patient reports less of dandruff but still having a lot of redness and itchy. ).  She also has an itchy bump on her back.   The following portions of the chart were reviewed this encounter and updated as appropriate:      Review of Systems: No other skin or systemic complaints except as noted in HPI or Assessment and Plan.   Objective  Well appearing patient in no apparent distress; mood and affect are within normal limits.  A focused examination was performed including back and scalp. Relevant physical exam findings are noted in the Assessment and Plan.  Scalp Mild erythema, no scale on scalp  left lateral mid back x 1 Erythematous keratotic or waxy stuck-on papule   Assessment & Plan  Seborrheic dermatitis Scalp  With Pruritus  Seborrheic Dermatitis  -  is a chronic persistent rash characterized by pinkness and scaling most commonly of the mid face but also can occur on the scalp (dandruff), ears; mid chest, mid back and groin.  It tends to be exacerbated by stress and cooler weather.  People who have neurologic disease may experience new onset or exacerbation of existing seborrheic dermatitis.  The condition is not curable but treatable and can be controlled.   Start clobetasol shampoo massage into dry scalp daily. Leave on for 15-20 minutes. Then wet and massage into scalp and rinse.   Once itching improved then restart ketoconazole shampoo 3-4x/wk as directed (alternate with clobetasol shampoo)  Topical steroids (such as triamcinolone, fluocinolone, fluocinonide, mometasone, clobetasol, halobetasol, betamethasone, hydrocortisone) can cause thinning and lightening of  the skin if they are used for too long in the same area. Your physician has selected the right strength medicine for your problem and area affected on the body. Please use your medication only as directed by your physician to prevent side effects.    Clobetasol Propionate 0.05 % shampoo - Scalp Apply daily to dry scalp and massage leave on for 15 minutes. Get wet and massage and then rinse out. Use for up to 4 weeks.  Inflamed seborrheic keratosis left lateral mid back x 1  Destruction of lesion - left lateral mid back x 1  Destruction method: cryotherapy   Informed consent: discussed and consent obtained   Lesion destroyed using liquid nitrogen: Yes   Region frozen until ice ball extended beyond lesion: Yes   Outcome: patient tolerated procedure well with no complications   Post-procedure details: wound care instructions given   Additional details:  Prior to procedure, discussed risks of blister formation, small wound, skin dyspigmentation, or rare scar following cryotherapy. Recommend Vaseline ointment to treated areas while healing.   Return for 1 month follow up seb derm . I, Ruthell Rummage, CMA, am acting as scribe for Brendolyn Patty, MD.  Documentation: I have reviewed the above documentation for accuracy and completeness, and I agree with the above.  Brendolyn Patty MD

## 2020-12-17 NOTE — Patient Instructions (Addendum)
Topical steroids (such as triamcinolone, fluocinolone, fluocinonide, mometasone, clobetasol, halobetasol, betamethasone, hydrocortisone) can cause thinning and lightening of the skin if they are used for too long in the same area. Your physician has selected the right strength medicine for your problem and area affected on the body. Please use your medication only as directed by your physician to prevent side effects.    If you have any questions or concerns for your doctor, please call our main line at [phone] and press option 4 to reach your doctor's medical assistant. If no one answers, please leave a voicemail as directed and we will return your call as soon as possible. Messages left after 4 pm will be answered the following business day.   You may also send us a message via MyChart. We typically respond to MyChart messages within 1-2 business days.  For prescription refills, please ask your pharmacy to contact our office. Our fax number is [phone].  If you have an urgent issue when the clinic is closed that cannot wait until the next business day, you can page your doctor at the number below.    Please note that while we do our best to be available for urgent issues outside of office hours, we are not available 24/7.   If you have an urgent issue and are unable to reach us, you may choose to seek medical care at your doctor's office, retail clinic, urgent care center, or emergency room.  If you have a medical emergency, please immediately call 911 or go to the emergency department.  Pager Numbers  - Dr. Kowalski: [phone]  - Dr. Moye: [phone]  - Dr. Stewart: [phone]  In the event of inclement weather, please call our main line at [phone] for an update on the status of any delays or closures.  Dermatology Medication Tips: Please keep the boxes that topical medications come in in order to help keep track of the instructions about where and how to use  these. Pharmacies typically print the medication instructions only on the boxes and not directly on the medication tubes.   If your medication is too expensive, please contact our office at [phone] option 4 or send us a message through MyChart.   We are unable to tell what your co-pay for medications will be in advance as this is different depending on your insurance coverage. However, we may be able to find a substitute medication at lower cost or fill out paperwork to get insurance to cover a needed medication.   If a prior authorization is required to get your medication covered by your insurance company, please allow us 1-2 business days to complete this process.  Drug prices often vary depending on where the prescription is filled and some pharmacies may offer cheaper prices.  The website www.goodrx.com contains coupons for medications through different pharmacies. The prices here do not account for what the cost may be with help from insurance (it may be cheaper with your insurance), but the website can give you the price if you did not use any insurance.  - You can print the associated coupon and take it with your prescription to the pharmacy.  - You may also stop by our office during regular business hours and pick up a GoodRx coupon card.  - If you need your prescription sent electronically to a different pharmacy, notify our office through [place] MyChart or by phone at [phone] option 4.  

## 2020-12-18 ENCOUNTER — Other Ambulatory Visit: Payer: Self-pay | Admitting: Family Medicine

## 2020-12-20 ENCOUNTER — Other Ambulatory Visit: Payer: Self-pay | Admitting: Family Medicine

## 2020-12-24 ENCOUNTER — Encounter: Payer: Self-pay | Admitting: Family Medicine

## 2020-12-24 ENCOUNTER — Ambulatory Visit (INDEPENDENT_AMBULATORY_CARE_PROVIDER_SITE_OTHER): Payer: Medicare Other | Admitting: Family Medicine

## 2020-12-24 ENCOUNTER — Other Ambulatory Visit: Payer: Self-pay

## 2020-12-24 VITALS — BP 120/78 | HR 83 | Temp 98.2°F | Ht 62.0 in | Wt 174.6 lb

## 2020-12-24 DIAGNOSIS — Z23 Encounter for immunization: Secondary | ICD-10-CM

## 2020-12-24 DIAGNOSIS — L299 Pruritus, unspecified: Secondary | ICD-10-CM | POA: Diagnosis not present

## 2020-12-24 DIAGNOSIS — E669 Obesity, unspecified: Secondary | ICD-10-CM | POA: Diagnosis not present

## 2020-12-24 DIAGNOSIS — R7303 Prediabetes: Secondary | ICD-10-CM | POA: Diagnosis not present

## 2020-12-24 DIAGNOSIS — I2510 Atherosclerotic heart disease of native coronary artery without angina pectoris: Secondary | ICD-10-CM | POA: Diagnosis not present

## 2020-12-24 DIAGNOSIS — G8929 Other chronic pain: Secondary | ICD-10-CM | POA: Diagnosis not present

## 2020-12-24 DIAGNOSIS — M545 Low back pain, unspecified: Secondary | ICD-10-CM | POA: Diagnosis not present

## 2020-12-24 DIAGNOSIS — M797 Fibromyalgia: Secondary | ICD-10-CM

## 2020-12-24 LAB — POCT GLYCOSYLATED HEMOGLOBIN (HGB A1C): Hemoglobin A1C: 5.9 % — AB (ref 4.0–5.6)

## 2020-12-24 NOTE — Assessment & Plan Note (Signed)
Check A1c.  Continue diet changes.  Encouraged exercise increase.  We will continue Ozempic for weight management.

## 2020-12-24 NOTE — Assessment & Plan Note (Signed)
The patient will continue with dietary changes.  She will try to get more exercise.  We will continue with Ozempic for weight management.

## 2020-12-24 NOTE — Assessment & Plan Note (Signed)
Chronic issue.  Generally stable.  I discussed that narcotics are not an appropriate medication for this issue.  Discussed continuing Lyrica and as needed Tylenol use.

## 2020-12-24 NOTE — Assessment & Plan Note (Signed)
Chronic issue.  Generally stable.  I discussed that narcotic pain medications are not recommended treatment for this.  She will continue with her current regimen of Lyrica and as needed over-the-counter Tylenol.

## 2020-12-24 NOTE — Assessment & Plan Note (Signed)
Certainly this could be seborrheic dermatitis.  It is certainly possible that one of her medications is causing this and she believes the carbamazepine may be contributing.  I encouraged her to discuss this with the prescribing physician.

## 2020-12-24 NOTE — Patient Instructions (Addendum)
Nice to see you. Please continue with your diet changes.  You need to add in some exercise.  You can continue on the Ozempic. Please contact the prescribing physician for your carbamazepine to discuss the scalp itching.

## 2020-12-24 NOTE — Progress Notes (Signed)
Nicole Rumps, MD Phone: [phone]  Nicole Washington is a 70 y.o. female who presents today for follow-up.  Prediabetes: Patient notes she is following a low-carb high-protein diet.  She gets plenty of fruits and vegetables and lean meats.  She was walking for exercise 2 days a week though fell off on that.  She has been on Ozempic for weight loss.  She does note some polydipsia.  Obesity: She has been working on diet and exercise.  She notes no side effects with the Ozempic.  Scalp itching: She saw dermatology and they diagnosed her with seborrheic dermatitis.  They gave her clobetasol shampoo though she notes no benefit with this.  She thinks that the carbamazepine and she has reduced her dose on her own.  She plans to discuss this with the prescribing physician.  Fibromyalgia/degenerative disc issues: Patient notes she is on Lyrica for this.  She takes Tylenol as needed for issues with this.  She wonders about getting some pain medication to take with her when she goes on a car trip.  When she is in the car for hours that seems to flare things up.  Social History   Tobacco Use  Smoking Status Former   Types: Cigarettes   Quit date: 12/20/1989   Years since quitting: 31.0  Smokeless Tobacco Never    Current Outpatient Medications on File Prior to Visit  Medication Sig Dispense Refill   amLODipine (NORVASC) 5 MG tablet TAKE 1 TABLET BY MOUTH EVERY DAY 90 tablet 1   aspirin EC 81 MG tablet Take 81 mg by mouth daily.     atorvastatin (LIPITOR) 40 MG tablet TAKE 1 TABLET BY MOUTH EVERY DAY 90 tablet 1   carbamazepine (TEGRETOL) 100 MG chewable tablet Chew 150 mg by mouth 2 (two) times daily.     carvedilol (COREG) 3.125 MG tablet TAKE 1 TABLET (3.125 MG TOTAL) BY MOUTH 2 (TWO) TIMES DAILY WITH A MEAL. 180 tablet 1   celecoxib (CELEBREX) 200 MG capsule Take 200 mg by mouth 2 (two) times daily as needed.     Clobetasol Propionate 0.05 % shampoo Apply daily to dry scalp and massage leave  on for 15 minutes. Get wet and massage and then rinse out. Use for up to 4 weeks. 118 mL 3   clonazePAM (KLONOPIN) 0.5 MG tablet TAKE TWO TABLETS BY MOUTH DAILY AS NEEDED FOR ANXIETY 60 tablet 0   DULoxetine (CYMBALTA) 60 MG capsule Take 60 mg by mouth 2 (two) times daily.     ketoconazole (NIZORAL) 2 % shampoo Apply 1 application topically as directed. shampoo 3x/wk, massage into scalp and let sit several minutes prior to rinsing, use daily prn flares 120 mL 2   losartan (COZAAR) 50 MG tablet Take 1 tablet (50 mg total) by mouth daily. 90 tablet 3   omeprazole (PRILOSEC) 20 MG capsule TAKE 1 CAPSULE BY MOUTH EVERY DAY 90 capsule 1   ondansetron (ZOFRAN) 4 MG tablet Take 1 tablet (4 mg total) by mouth every 8 (eight) hours as needed. 40 tablet 0   pregabalin (LYRICA) 150 MG capsule Take 150 mg by mouth 2 (two) times daily.     Semaglutide, 1 MG/DOSE, (OZEMPIC, 1 MG/DOSE,) 4 MG/3ML SOPN Inject 1 mg into the skin once a week. 9 mL 1   sertraline (ZOLOFT) 100 MG tablet Take 100 mg by mouth daily.     traZODone (DESYREL) 100 MG tablet Take 100 mg by mouth at bedtime.     tretinoin (RETIN-A) 0.05 %  cream Apply topically at bedtime. Qhs as tolerated to face for bumps 45 g 4   No current facility-administered medications on file prior to visit.     ROS see history of present illness  Objective  Physical Exam Vitals:   12/24/20 1249  BP: 120/78  Pulse: 83  Temp: 98.2 F (36.8 C)  SpO2: 97%    BP Readings from Last 3 Encounters:  12/24/20 120/78  10/08/20 128/80  09/08/20 (!) 150/93   Wt Readings from Last 3 Encounters:  12/24/20 174 lb 9.6 oz (79.2 kg)  12/13/20 173 lb (78.5 kg)  10/08/20 185 lb 9.6 oz (84.2 kg)    Physical Exam Constitutional:      General: She is not in acute distress.    Appearance: She is not diaphoretic.  Cardiovascular:     Rate and Rhythm: Normal rate and regular rhythm.     Heart sounds: Normal heart sounds.  Pulmonary:     Effort: Pulmonary effort  is normal.     Breath sounds: Normal breath sounds.  Skin:    General: Skin is warm and dry.  Neurological:     Mental Status: She is alert.     Assessment/Plan: Please see individual problem list.  Problem List Items Addressed This Visit     Chronic low back pain    Chronic issue.  Generally stable.  I discussed that narcotic pain medications are not recommended treatment for this.  She will continue with her current regimen of Lyrica and as needed over-the-counter Tylenol.      Fibromyalgia    Chronic issue.  Generally stable.  I discussed that narcotics are not an appropriate medication for this issue.  Discussed continuing Lyrica and as needed Tylenol use.      Obesity (BMI 30.0-34.9)    The patient will continue with dietary changes.  She will try to get more exercise.  We will continue with Ozempic for weight management.      Prediabetes - Primary    Check A1c.  Continue diet changes.  Encouraged exercise increase.  We will continue Ozempic for weight management.      Relevant Orders   POCT HgB A1C (Completed)   Scalp itch    Certainly this could be seborrheic dermatitis.  It is certainly possible that one of her medications is causing this and she believes the carbamazepine may be contributing.  I encouraged her to discuss this with the prescribing physician.      Other Visit Diagnoses     Need for immunization against influenza       Relevant Orders   Flu Vaccine QUAD High Dose(Fluad) (Completed)       Return in about 3 months (around 03/26/2021) for Weight follow-up.  This visit occurred during the SARS-CoV-2 public health emergency.  Safety protocols were in place, including screening questions prior to the visit, additional usage of staff PPE, and extensive cleaning of exam room while observing appropriate contact time as indicated for disinfecting solutions.    Nicole Rumps, MD [place]

## 2020-12-25 DIAGNOSIS — F4389 Other reactions to severe stress: Secondary | ICD-10-CM | POA: Diagnosis not present

## 2020-12-30 ENCOUNTER — Telehealth: Payer: Self-pay | Admitting: Family Medicine

## 2020-12-30 NOTE — Telephone Encounter (Signed)
Patient is having constipation due to the Ozempic. Would like to know what she can do to help this situation .

## 2020-12-30 NOTE — Telephone Encounter (Signed)
Pt is calling back per previous message. Pt is going out of town tomorrow   [phone]

## 2020-12-31 NOTE — Telephone Encounter (Signed)
She could try Metamucil or MiraLAX.

## 2020-12-31 NOTE — Telephone Encounter (Signed)
Called and LVM for patient to call back.  Nicole Washington,cma

## 2020-12-31 NOTE — Telephone Encounter (Signed)
Okay to advise fiber or stool softener?

## 2021-01-16 DIAGNOSIS — Z23 Encounter for immunization: Secondary | ICD-10-CM | POA: Diagnosis not present

## 2021-01-28 ENCOUNTER — Ambulatory Visit (INDEPENDENT_AMBULATORY_CARE_PROVIDER_SITE_OTHER): Payer: Medicare Other | Admitting: Dermatology

## 2021-01-28 ENCOUNTER — Other Ambulatory Visit: Payer: Self-pay

## 2021-01-28 DIAGNOSIS — I2510 Atherosclerotic heart disease of native coronary artery without angina pectoris: Secondary | ICD-10-CM

## 2021-01-28 DIAGNOSIS — L219 Seborrheic dermatitis, unspecified: Secondary | ICD-10-CM

## 2021-01-28 DIAGNOSIS — L719 Rosacea, unspecified: Secondary | ICD-10-CM

## 2021-01-28 DIAGNOSIS — L821 Other seborrheic keratosis: Secondary | ICD-10-CM

## 2021-01-28 MED ORDER — CLOBETASOL PROPIONATE 0.05 % EX SOLN: 1.0000 "application " | Freq: Every day | CUTANEOUS | 0 refills | Status: DC

## 2021-01-28 MED ORDER — METRONIDAZOLE 0.75 % EX CREA: TOPICAL_CREAM | Freq: Two times a day (BID) | CUTANEOUS | 2 refills | Status: AC

## 2021-01-28 NOTE — Patient Instructions (Addendum)
Start clobetasol 0.05% solution to spot treat affected areas as needed for itch. Avoid applying to face, groin, and axilla. Use as directed. Long-term use can cause thinning of the skin.  Continue clobetasol shampoo decreasing to a few times a week, alternating with ketoconazole shampoo.   Topical steroids (such as triamcinolone, fluocinolone, fluocinonide, mometasone, clobetasol, halobetasol, betamethasone, hydrocortisone) can cause thinning and lightening of the skin if they are used for too long in the same area. Your physician has selected the right strength medicine for your problem and area affected on the body. Please use your medication only as directed by your physician to prevent side effects.   Rosacea is a chronic progressive skin condition usually affecting the face of adults, causing redness and/or acne bumps. It is treatable but not curable. It sometimes affects the eyes (ocular rosacea) as well. It may respond to topical and/or systemic medication and can flare with stress, sun exposure, alcohol, exercise and some foods.  Daily application of broad spectrum spf 30+ sunscreen to face is recommended to reduce flares.  Start metronidazole 0.75% cream 1-2 times daily.   Recommend Zeasorb AF powder daily for rash.   If You Need Anything After Your Visit  If you have any questions or concerns for your doctor, please call our main line at [phone] and press option 4 to reach your doctor's medical assistant. If no one answers, please leave a voicemail as directed and we will return your call as soon as possible. Messages left after 4 pm will be answered the following business day.   You may also send [place] a message via [place]. We typically respond to MyChart messages within 1-2 business days.  For prescription refills, please ask your pharmacy to contact our office. Our fax number is [phone].  If you have an urgent issue when the clinic is closed that cannot wait until the next  business day, you can page your doctor at the number below.    Please note that while we do our best to be available for urgent issues outside of office hours, we are not available 24/7.   If you have an urgent issue and are unable to reach [place], you may choose to seek medical care at your doctor's office, retail clinic, urgent care center, or emergency room.  If you have a medical emergency, please immediately call 911 or go to the emergency department.  Pager Numbers  - Dr. Nehemiah Massed: [phone]  - Dr. Laurence Ferrari: [phone]  - Dr. Nicole Kindred: [phone]  In the event of inclement weather, please call our main line at [phone] for an update on the status of any delays or closures.  Dermatology Medication Tips: Please keep the boxes that topical medications come in in order to help keep track of the instructions about where and how to use these. Pharmacies typically print the medication instructions only on the boxes and not directly on the medication tubes.   If your medication is too expensive, please contact our office at [phone] option 4 or send [place] a message through [place].   We are unable to tell what your co-pay for medications will be in advance as this is different depending on your insurance coverage. However, we may be able to find a substitute medication at lower cost or fill out paperwork to get insurance to cover a needed medication.   If a prior authorization is required to get your medication covered by your insurance company, please allow [place] 1-2 business days to complete this process.  Drug prices often vary depending on where the prescription is filled and some pharmacies may offer cheaper prices.  The website www.goodrx.com contains coupons for medications through different pharmacies. The prices here do not account for what the cost may be with help from insurance (it may be cheaper with your insurance), but the website can give you the price if you did not use  any insurance.  - You can print the associated coupon and take it with your prescription to the pharmacy.  - You may also stop by our office during regular business hours and pick up a GoodRx coupon card.  - If you need your prescription sent electronically to a different pharmacy, notify our office through [facility] or by phone at [phone] option 4.     Si Usted Necesita Algo Despus de Su Visita  Tambin puede enviarnos un mensaje a travs de Pharmacist, community. Por lo general respondemos a los mensajes de MyChart en el transcurso de 1 a 2 das hbiles.  Para renovar recetas, por favor pida a su farmacia que se ponga en contacto con nuestra oficina. Harland Dingwall de fax es [place] [phone].  Si tiene un asunto urgente cuando la clnica est cerrada y que no puede esperar hasta el siguiente da hbil, puede llamar/localizar a su doctor(a) al nmero que aparece a continuacin.   Por favor, tenga en cuenta que aunque hacemos todo lo posible para estar disponibles para asuntos urgentes fuera del horario de [place], no estamos disponibles las 24 horas del da, los 7 das de la [place].   Si tiene un problema urgente y no puede comunicarse con nosotros, puede optar por buscar atencin mdica  en el consultorio de su doctor(a), en una clnica privada, en un centro de atencin urgente o en una sala de emergencias.  Si tiene Engineering geologist, por favor llame inmediatamente al 911 o vaya a la sala de emergencias.  Nmeros de bper  - Dr. Nehemiah Massed: [phone]  - Dra. Moye: [phone]  - Dra. Nicole Kindred: [phone]  En caso de inclemencias del [place], por favor llame a Johnsie Kindred principal al [phone] para una actualizacin sobre el [place] de cualquier retraso o cierre.  Consejos para la medicacin en dermatologa: Por favor, guarde las cajas en las que vienen los medicamentos de uso tpico para ayudarle a seguir las instrucciones sobre dnde y cmo usarlos. Las farmacias  generalmente imprimen las instrucciones del medicamento slo en las cajas y no directamente en los tubos del [place].   Si su medicamento es muy caro, por favor, pngase en contacto con Zigmund Daniel llamando al [phone] y presione la opcin 4 o envenos un mensaje a travs de Pharmacist, community.   No podemos decirle cul ser su copago por los medicamentos por adelantado ya que esto es diferente dependiendo de la cobertura de su seguro. Sin embargo, es posible que podamos encontrar un medicamento sustituto a Electrical engineer un formulario para que el seguro cubra el medicamento que se considera necesario.   Si se requiere una autorizacin previa para que su compaa de seguros [place] su medicamento, por favor permtanos de 1 a 2 das hbiles para completar este proceso.  Los precios de los medicamentos varan con frecuencia dependiendo del Environmental consultant de dnde se surte la receta y alguna farmacias pueden ofrecer precios ms baratos.  El sitio web www.goodrx.com tiene cupones para medicamentos de Airline pilot. Los precios aqu no tienen en cuenta lo que podra costar con la ayuda del seguro (puede ser ms  barato con su seguro), pero el sitio web puede darle el precio si no Field seismologist.  - Puede imprimir el cupn correspondiente y llevarlo con su receta a la farmacia.  - Tambin puede pasar por nuestra oficina durante el horario de atencin regular y Charity fundraiser una tarjeta de cupones de GoodRx.  - Si necesita que su receta se enve electrnicamente a una farmacia diferente, informe a nuestra oficina a travs de MyChart de [place] o por telfono llamando al [phone] y presione la opcin 4.

## 2021-01-28 NOTE — Progress Notes (Signed)
° °  Follow-Up Visit   Subjective  Nicole Washington is a 70 y.o. female who presents for the following: Follow-up (Patient here today for 6 week seb derm follow up. Currently she is using clobetasol shampoo daily, improved but still has some itching. Patient feels it could be related to a medication and when she follows up with prescribing MD she will see if there are any alternatives. ) and Rash (Patient with a rash and itching under breasts. She is using over the counter cortisone. ).   The following portions of the chart were reviewed this encounter and updated as appropriate:       Review of Systems:  No other skin or systemic complaints except as noted in HPI or Assessment and Plan.  Objective  Well appearing patient in no apparent distress; mood and affect are within normal limits.  A focused examination was performed including face, scalp, inframammary, abdomen. Relevant physical exam findings are noted in the Assessment and Plan.  Scalp Scalp is clear today  face Inflammatory papules at chin and right medial cheek   Assessment & Plan  Seborrheic dermatitis Scalp  Improved  Start clobetasol 0.05% solution to spot treat affected areas qd/bid as needed for itch. Avoid applying to face, groin, and axilla. Use as directed. Long-term use can cause thinning of the skin.  Continue clobetasol shampoo decreasing to a few times a week, alternating with ketoconazole shampoo.   Topical steroids (such as triamcinolone, fluocinolone, fluocinonide, mometasone, clobetasol, halobetasol, betamethasone, hydrocortisone) can cause thinning and lightening of the skin if they are used for too long in the same area. Your physician has selected the right strength medicine for your problem and area affected on the body. Please use your medication only as directed by your physician to prevent side effects.    clobetasol (TEMOVATE) 0.05 % external solution - Scalp Apply 1 application topically daily. Spot  treat affected areas as needed for itch. Avoid applying to face, groin, and axilla. Use as directed. Long-term use can cause thinning of the skin.  Related Medications Clobetasol Propionate 0.05 % shampoo Apply daily to dry scalp and massage leave on for 15 minutes. Get wet and massage and then rinse out. Use for up to 4 weeks.  Rosacea face  Rosacea is a chronic progressive skin condition usually affecting the face of adults, causing redness and/or acne bumps. It is treatable but not curable. It sometimes affects the eyes (ocular rosacea) as well. It may respond to topical and/or systemic medication and can flare with stress, sun exposure, alcohol, exercise and some foods.  Daily application of broad spectrum spf 30+ sunscreen to face is recommended to reduce flares.  Start metronidazole 0.75% cream 1-2 times daily.   metroNIDAZOLE (METROCREAM) 0.75 % cream - face Apply topically 2 (two) times daily.  Seborrheic Keratoses - Stuck-on, waxy, tan-brown papules inframammary - Benign-appearing. - Discussed benign etiology and prognosis. Discussed cryotherapy if spot(s) become irritated or inflamed.  - Observe - Call for any changes  Return if symptoms worsen or fail to improve.  Graciella Belton, RMA, am acting as scribe for Brendolyn Patty, MD .  Documentation: I have reviewed the above documentation for accuracy and completeness, and I agree with the above.  Brendolyn Patty MD

## 2021-02-11 ENCOUNTER — Telehealth: Payer: Self-pay

## 2021-02-11 NOTE — Telephone Encounter (Signed)
What medication is this for?

## 2021-02-11 NOTE — Telephone Encounter (Signed)
So sorry I thought I attached it. The Klonopin.

## 2021-02-12 MED ORDER — CLONAZEPAM 0.5 MG PO TABS: ORAL_TABLET | ORAL | 0 refills | Status: DC

## 2021-02-12 NOTE — Telephone Encounter (Signed)
Sent to pharmacy 

## 2021-02-12 NOTE — Addendum Note (Signed)
Addended by: Leone Haven on: 02/12/2021 09:32 AM   Modules accepted: Orders

## 2021-02-13 ENCOUNTER — Telehealth: Payer: Self-pay | Admitting: Family Medicine

## 2021-02-13 NOTE — Telephone Encounter (Signed)
Patient called in states want to speak with Catie. She has some questions about medication. Please call patient back

## 2021-02-13 NOTE — Telephone Encounter (Signed)
Called patient. Reports Ozempic copay has gone up. Discussed concepts of the Medicare Coverage Gap. Encouraged her to call [employer] to discuss.   Also reports constipation. I encouraged increased hydration, fiber intake, physical activity, over the counter stool softeners as needed. Discussed that given the fact this medication is primarily being used for weight management and prevention of progression to diabetes, and not treatment of diabetes, it may also be appropriate to consider discontinuation. She reports she will contemplate.

## 2021-02-24 DIAGNOSIS — F5105 Insomnia due to other mental disorder: Secondary | ICD-10-CM | POA: Diagnosis not present

## 2021-02-24 DIAGNOSIS — F4312 Post-traumatic stress disorder, chronic: Secondary | ICD-10-CM | POA: Diagnosis not present

## 2021-02-24 DIAGNOSIS — F411 Generalized anxiety disorder: Secondary | ICD-10-CM | POA: Diagnosis not present

## 2021-02-24 DIAGNOSIS — F39 Unspecified mood [affective] disorder: Secondary | ICD-10-CM | POA: Diagnosis not present

## 2021-02-25 ENCOUNTER — Other Ambulatory Visit: Payer: Self-pay | Admitting: Family Medicine

## 2021-02-25 DIAGNOSIS — Z1231 Encounter for screening mammogram for malignant neoplasm of breast: Secondary | ICD-10-CM

## 2021-03-06 ENCOUNTER — Other Ambulatory Visit: Payer: Self-pay | Admitting: Family Medicine

## 2021-03-06 DIAGNOSIS — F419 Anxiety disorder, unspecified: Secondary | ICD-10-CM | POA: Diagnosis not present

## 2021-03-06 DIAGNOSIS — G894 Chronic pain syndrome: Secondary | ICD-10-CM | POA: Diagnosis not present

## 2021-03-06 DIAGNOSIS — N951 Menopausal and female climacteric states: Secondary | ICD-10-CM | POA: Diagnosis not present

## 2021-03-13 ENCOUNTER — Telehealth: Payer: Self-pay | Admitting: Internal Medicine

## 2021-03-13 ENCOUNTER — Telehealth: Payer: Self-pay | Admitting: Family Medicine

## 2021-03-13 NOTE — Telephone Encounter (Signed)
Pt called in stating that her ozempic refill is over 600 dollars and she does not want to pay that. Pt wants to stop taking the medicine because of the cost. Pt want to know if provider want to start her on a new medication or stop the ozempic all together

## 2021-03-13 NOTE — Telephone Encounter (Signed)
Pt called in stating that her ozempic refill is over 600 dollars and she does not want to pay that. Pt wants to stop taking the medicine because of the cost. Pt want to know if provider want to start her on a new medication or stop the ozempic all together Jarman Litton,cma

## 2021-03-13 NOTE — Telephone Encounter (Signed)
Does pt want to try sample (if so log sample out) until Dr. Chauncey Cruel comes back or see catie or google a coupon Dr.Kiylee Thoreson McLean-Scocuzza

## 2021-03-14 NOTE — Telephone Encounter (Signed)
See other message

## 2021-03-14 NOTE — Telephone Encounter (Signed)
Patient called and note was read to her. Patient would like to discuss this with Dr Caryl Bis at her next appointment in 2 weeks.

## 2021-03-14 NOTE — Telephone Encounter (Signed)
I called and spoke with the patient and she stated she would rather wait and see the provider on 04/02/2021 to discuss trying a new medication.  Nicole [place],cma

## 2021-03-14 NOTE — Telephone Encounter (Signed)
She can certainly stop the ozempic. She is on it for weight loss.  Would she want to see the pharmacist to determine if she may qualify for assistance on weight loss medication or if there would be other weight loss medications that would be appropriate for her?

## 2021-03-21 ENCOUNTER — Other Ambulatory Visit: Payer: Self-pay | Admitting: Family Medicine

## 2021-04-02 ENCOUNTER — Encounter: Payer: Self-pay | Admitting: Family Medicine

## 2021-04-02 ENCOUNTER — Ambulatory Visit (INDEPENDENT_AMBULATORY_CARE_PROVIDER_SITE_OTHER): Payer: Medicare Other | Admitting: Family Medicine

## 2021-04-02 ENCOUNTER — Other Ambulatory Visit: Payer: Self-pay

## 2021-04-02 VITALS — BP 120/80 | HR 100 | Temp 98.4°F | Ht 62.0 in | Wt 172.8 lb

## 2021-04-02 DIAGNOSIS — E038 Other specified hypothyroidism: Secondary | ICD-10-CM | POA: Diagnosis not present

## 2021-04-02 DIAGNOSIS — G5600 Carpal tunnel syndrome, unspecified upper limb: Secondary | ICD-10-CM | POA: Insufficient documentation

## 2021-04-02 DIAGNOSIS — R7303 Prediabetes: Secondary | ICD-10-CM | POA: Diagnosis not present

## 2021-04-02 DIAGNOSIS — E669 Obesity, unspecified: Secondary | ICD-10-CM | POA: Diagnosis not present

## 2021-04-02 DIAGNOSIS — G5603 Carpal tunnel syndrome, bilateral upper limbs: Secondary | ICD-10-CM | POA: Diagnosis not present

## 2021-04-02 LAB — HEMOGLOBIN A1C: Hgb A1c MFr Bld: 6.2 % (ref 4.6–6.5)

## 2021-04-02 LAB — TSH: TSH: 1.83 u[IU]/mL (ref 0.35–5.50)

## 2021-04-02 NOTE — Assessment & Plan Note (Signed)
I encouraged her to continue her exercise.  Discussed reduction of sugary food intake.

## 2021-04-02 NOTE — Assessment & Plan Note (Signed)
Check TSH 

## 2021-04-02 NOTE — Patient Instructions (Signed)
Nice to see you. We will check lab work today. Please go to a medical supply store or look on [employer] and get cock up splints for both of your wrist.  You should wear these at night.  If the tingling in your hands does not improve with wearing those please let me know. Please continue with walking and try to reduce your sugar intake.

## 2021-04-02 NOTE — Progress Notes (Signed)
Tommi Rumps, MD Phone: [phone]  Nicole Washington is a 71 y.o. female who presents today for f/u.  Obesity: Patient is no longer on Ozempic given cost.  She notes it also stopped curbing her appetite.  Her diet is been terrible since the holidays.  She has been eating lots of sugary foods.  She walks 3 times a week.  Prediabetes: Notes polydipsia.  She attributes this to dry mouth.  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  No skin changes.  No heat or cold intolerance.  Tingling hands: Patient notes tingling in her palms intermittently for the past month.  She notes no numbness or weakness.  She occasionally drops things though notes that been going on a long time.  She did type for work.  Social History   Tobacco Use  Smoking Status Former   Types: Cigarettes   Quit date: 12/20/1989   Years since quitting: 31.3  Smokeless Tobacco Never    Current Outpatient Medications on File Prior to Visit  Medication Sig Dispense Refill   amLODipine (NORVASC) 5 MG tablet TAKE 1 TABLET BY MOUTH EVERY DAY 90 tablet 1   aspirin EC 81 MG tablet Take 81 mg by mouth daily.     atorvastatin (LIPITOR) 40 MG tablet TAKE 1 TABLET BY MOUTH EVERY DAY 90 tablet 1   carbamazepine (TEGRETOL) 100 MG chewable tablet Chew 150 mg by mouth 2 (two) times daily.     carvedilol (COREG) 3.125 MG tablet TAKE 1 TABLET (3.125 MG TOTAL) BY MOUTH 2 (TWO) TIMES DAILY WITH A MEAL. 180 tablet 1   celecoxib (CELEBREX) 200 MG capsule Take 200 mg by mouth 2 (two) times daily as needed.     clobetasol (TEMOVATE) 0.05 % external solution Apply 1 application topically daily. Spot treat affected areas as needed for itch. Avoid applying to face, groin, and axilla. Use as directed. Long-term use can cause thinning of the skin. 50 mL 0   Clobetasol Propionate 0.05 % shampoo Apply daily to dry scalp and massage leave on for 15 minutes. Get wet and massage and then rinse out. Use for up to 4 weeks. 118 mL 3    clonazePAM (KLONOPIN) 0.5 MG tablet TAKE TWO TABLETS BY MOUTH DAILY AS NEEDED FOR ANXIETY 60 tablet 0   DULoxetine (CYMBALTA) 60 MG capsule Take 60 mg by mouth 2 (two) times daily.     ketoconazole (NIZORAL) 2 % shampoo Apply 1 application topically as directed. shampoo 3x/wk, massage into scalp and let sit several minutes prior to rinsing, use daily prn flares 120 mL 2   losartan (COZAAR) 50 MG tablet Take 1 tablet (50 mg total) by mouth daily. 90 tablet 3   metroNIDAZOLE (METROCREAM) 0.75 % cream Apply topically 2 (two) times daily. 45 g 2   omeprazole (PRILOSEC) 20 MG capsule TAKE 1 CAPSULE BY MOUTH EVERY DAY 90 capsule 1   ondansetron (ZOFRAN) 4 MG tablet Take 1 tablet (4 mg total) by mouth every 8 (eight) hours as needed. 40 tablet 0   pregabalin (LYRICA) 150 MG capsule Take 150 mg by mouth 2 (two) times daily.     Semaglutide, 1 MG/DOSE, (OZEMPIC, 1 MG/DOSE,) 4 MG/3ML SOPN Inject 1 mg into the skin once a week. 9 mL 1   sertraline (ZOLOFT) 100 MG tablet Take 100 mg by mouth daily.     traZODone (DESYREL) 100 MG tablet Take 100 mg by mouth at bedtime.     tretinoin (RETIN-A) 0.05 % cream Apply topically at  bedtime. Qhs as tolerated to face for bumps 45 g 4   No current facility-administered medications on file prior to visit.     ROS see history of present illness  Objective  Physical Exam Vitals:   04/02/21 1323  BP: 120/80  Pulse: 100  Temp: 98.4 F (36.9 C)  SpO2: 96%    BP Readings from Last 3 Encounters:  04/02/21 120/80  12/24/20 120/78  10/08/20 128/80   Wt Readings from Last 3 Encounters:  04/02/21 172 lb 12.8 oz (78.4 kg)  12/24/20 174 lb 9.6 oz (79.2 kg)  12/13/20 173 lb (78.5 kg)    Physical Exam Constitutional:      General: She is not in acute distress.    Appearance: She is not diaphoretic.  Cardiovascular:     Rate and Rhythm: Normal rate and regular rhythm.     Heart sounds: Normal heart sounds.  Pulmonary:     Effort: Pulmonary effort is normal.      Breath sounds: Normal breath sounds.  Musculoskeletal:     Comments: Positive Phalen's bilaterally, negative Tinel's bilaterally  Skin:    General: Skin is warm and dry.  Neurological:     Mental Status: She is alert.     Comments: 5/5 strength in bilateral biceps, triceps, grip, quads, hamstrings, plantar and dorsiflexion, sensation to light touch intact in bilateral UE and LE, normal gait     Assessment/Plan: Please see individual problem list.  Problem List Items Addressed This Visit     Carpal tunnel syndrome    The patient likely has carpal tunnel syndrome bilateral wrists.  Discussed using cock-up splints which she can get at a medical supply store or order from [employer].  She will use these nightly.  If she is not improving over the next several weeks she will let us know.  I did discuss the potential for seeing a surgeon if not improving.      Obesity (BMI 30.0-34.9)    I encouraged her to continue her exercise.  Discussed reduction of sugary food intake.      Prediabetes    Check A1c.  She will continue to work on diet and exercise.  Discussed the potential for starting on a different medication if her A1c is in the diabetic range.      Relevant Orders   HgB A1c   Subclinical hypothyroidism - Primary    Check TSH.      Relevant Orders   TSH     Return in about 3 months (around 06/30/2021) for Weight follow-up.  This visit occurred during the SARS-CoV-2 public health emergency.  Safety protocols were in place, including screening questions prior to the visit, additional usage of staff PPE, and extensive cleaning of exam room while observing appropriate contact time as indicated for disinfecting solutions.   I have spent 32 minutes in the care of this patient regarding chart review, history taking, completion of exam, discussion of plan, placing orders.   Tommi Rumps, MD [place]

## 2021-04-02 NOTE — Assessment & Plan Note (Signed)
The patient likely has carpal tunnel syndrome bilateral wrists.  Discussed using cock-up splints which she can get at a medical supply store or order from [employer].  She will use these nightly.  If she is not improving over the next several weeks she will let us know.  I did discuss the potential for seeing a surgeon if not improving.

## 2021-04-02 NOTE — Assessment & Plan Note (Signed)
Check A1c.  She will continue to work on diet and exercise.  Discussed the potential for starting on a different medication if her A1c is in the diabetic range.

## 2021-04-03 ENCOUNTER — Ambulatory Visit
Admission: RE | Admit: 2021-04-03 | Discharge: 2021-04-03 | Disposition: A | Discharge status: Home / Self Care | Payer: Medicare Other | Source: Ambulatory Visit | Attending: Family Medicine | Admitting: Family Medicine

## 2021-04-03 DIAGNOSIS — Z1231 Encounter for screening mammogram for malignant neoplasm of breast: Secondary | ICD-10-CM | POA: Insufficient documentation

## 2021-04-09 DIAGNOSIS — M1712 Unilateral primary osteoarthritis, left knee: Secondary | ICD-10-CM | POA: Diagnosis not present

## 2021-04-09 DIAGNOSIS — M19012 Primary osteoarthritis, left shoulder: Secondary | ICD-10-CM | POA: Diagnosis not present

## 2021-04-24 ENCOUNTER — Ambulatory Visit: Payer: Self-pay | Admitting: Pharmacist

## 2021-04-24 NOTE — Chronic Care Management (AMB) (Signed)
?  Chronic Care Management  ? ?Note ? ?04/24/2021 ?Name: Caldonia Leap MRN: [id number] DOB: 06-28-1950 ? ? ? ?Closing pharmacy CCM case at this time. Patient has clinic contact information for future questions or concerns.  ? ?Catie Darnelle Maffucci, PharmD, [place], CPP ?Clinical Pharmacist ?Therapist, music at [employer] ?[phone] ? ?

## 2021-05-14 ENCOUNTER — Other Ambulatory Visit: Payer: Self-pay | Admitting: Family Medicine

## 2021-05-30 ENCOUNTER — Other Ambulatory Visit: Payer: Self-pay | Admitting: Dermatology

## 2021-05-30 ENCOUNTER — Other Ambulatory Visit: Payer: Self-pay | Admitting: Internal Medicine

## 2021-05-30 DIAGNOSIS — I10 Essential (primary) hypertension: Secondary | ICD-10-CM

## 2021-05-30 DIAGNOSIS — L219 Seborrheic dermatitis, unspecified: Secondary | ICD-10-CM

## 2021-06-05 DIAGNOSIS — F39 Unspecified mood [affective] disorder: Secondary | ICD-10-CM | POA: Diagnosis not present

## 2021-06-05 DIAGNOSIS — F4312 Post-traumatic stress disorder, chronic: Secondary | ICD-10-CM | POA: Diagnosis not present

## 2021-06-05 DIAGNOSIS — F5105 Insomnia due to other mental disorder: Secondary | ICD-10-CM | POA: Diagnosis not present

## 2021-06-05 DIAGNOSIS — F411 Generalized anxiety disorder: Secondary | ICD-10-CM | POA: Diagnosis not present

## 2021-06-23 ENCOUNTER — Ambulatory Visit (INDEPENDENT_AMBULATORY_CARE_PROVIDER_SITE_OTHER): Payer: Medicare Other | Admitting: Family Medicine

## 2021-06-23 ENCOUNTER — Telehealth: Payer: Self-pay | Admitting: Family Medicine

## 2021-06-23 ENCOUNTER — Encounter: Payer: Self-pay | Admitting: Family Medicine

## 2021-06-23 DIAGNOSIS — J988 Other specified respiratory disorders: Secondary | ICD-10-CM

## 2021-06-23 DIAGNOSIS — J40 Bronchitis, not specified as acute or chronic: Secondary | ICD-10-CM | POA: Insufficient documentation

## 2021-06-23 DIAGNOSIS — B9789 Other viral agents as the cause of diseases classified elsewhere: Secondary | ICD-10-CM | POA: Diagnosis not present

## 2021-06-23 HISTORY — DX: Bronchitis, not specified as acute or chronic: J40

## 2021-06-23 LAB — POC COVID19 BINAXNOW: SARS Coronavirus 2 Ag: NEGATIVE

## 2021-06-23 MED ORDER — HYDROCOD POLI-CHLORPHE POLI ER 10-8 MG/5ML PO SUER: 5.0000 mL | Freq: Two times a day (BID) | ORAL | 0 refills | Status: DC | PRN

## 2021-06-23 NOTE — Telephone Encounter (Signed)
Sent to pharmacy 

## 2021-06-23 NOTE — Telephone Encounter (Signed)
[facility] called stating that they need a new cough prescription called in because there was a technical error which gave them two prescriptions and it made them delete both of them ?

## 2021-06-23 NOTE — Progress Notes (Signed)
?Tommi Rumps, MD ?Phone: [phone] ? ?Nicole Washington is a 71 y.o. female who presents today for same-day visit. ? ?Cough: Patient notes onset of symptoms on 06/17/2021.  Started with cough.  She has since developed some nasal congestion with clear nasal discharge.  She had some rhinorrhea over the last day or so.  She notes no fevers, shortness of breath, or postnasal drip.  Some ear fullness.  She has been using over-the-counter cough and cold medications with no benefit.  She had a negative home COVID test on 06/18/2021 and 06/21/2021.  She feels as though she is getting worse.  No known COVID exposures. ? ?Social History  ? ?Tobacco Use  ?Smoking Status Former  ? Types: Cigarettes  ? Quit date: 12/20/1989  ? Years since quitting: 31.5  ?Smokeless Tobacco Never  ? ? ?Current Outpatient Medications on File Prior to Visit  ?Medication Sig Dispense Refill  ? amLODipine (NORVASC) 5 MG tablet TAKE 1 TABLET BY MOUTH EVERY DAY 90 tablet 1  ? aspirin EC 81 MG tablet Take 81 mg by mouth daily.    ? atorvastatin (LIPITOR) 40 MG tablet TAKE 1 TABLET BY MOUTH EVERY DAY 90 tablet 1  ? carvedilol (COREG) 3.125 MG tablet TAKE 1 TABLET (3.125 MG TOTAL) BY MOUTH 2 (TWO) TIMES DAILY WITH A MEAL. 180 tablet 1  ? clobetasol (TEMOVATE) 0.05 % external solution APPLY 1 APPLICATION TOPICALLY DAILY. SPOT TREAT AFFECTED AREAS AS NEEDED FOR ITCH. AVOID APPLYING TO FACE, GROIN, AND AXILLA. USE AS DIRECTED. LONG-TERM USE CAN CAUSE THINNING OF THE SKIN. 50 mL 0  ? Clobetasol Propionate 0.05 % shampoo Apply daily to dry scalp and massage leave on for 15 minutes. Get wet and massage and then rinse out. Use for up to 4 weeks. 118 mL 3  ? clonazePAM (KLONOPIN) 0.5 MG tablet TAKE TWO TABLETS BY MOUTH DAILY AS NEEDED FOR ANXIETY 60 tablet 0  ? DULoxetine (CYMBALTA) 60 MG capsule Take 60 mg by mouth 2 (two) times daily.    ? ketoconazole (NIZORAL) 2 % shampoo Apply 1 application topically as directed. shampoo 3x/wk, massage into scalp and let sit  several minutes prior to rinsing, use daily prn flares 120 mL 2  ? losartan (COZAAR) 50 MG tablet TAKE 1 TABLET BY MOUTH EVERY DAY 90 tablet 3  ? metroNIDAZOLE (METROCREAM) 0.75 % cream Apply topically 2 (two) times daily. 45 g 2  ? omeprazole (PRILOSEC) 20 MG capsule TAKE 1 CAPSULE BY MOUTH EVERY DAY 90 capsule 1  ? pregabalin (LYRICA) 150 MG capsule Take 150 mg by mouth 2 (two) times daily.    ? sertraline (ZOLOFT) 100 MG tablet Take 100 mg by mouth daily.    ? traZODone (DESYREL) 100 MG tablet Take 100 mg by mouth at bedtime.    ? tretinoin (RETIN-A) 0.05 % cream Apply topically at bedtime. Qhs as tolerated to face for bumps 45 g 4  ? amitriptyline (ELAVIL) 10 MG tablet amitriptyline 10 mg tablet ? Take 1 tablet twice a day by oral route for 90 days.    ? ?No current facility-administered medications on file prior to visit.  ? ? ? ?ROS see history of present illness ? ?Objective ? ?Physical Exam ?Vitals:  ? 06/23/21 1521  ?BP: 130/80  ?Pulse: 98  ?Temp: 99.2 ?F (37.3 ?C)  ?SpO2: 96%  ? ? ?BP Readings from Last 3 Encounters:  ?06/23/21 130/80  ?04/02/21 120/80  ?12/24/20 120/78  ? ?Wt Readings from Last 3 Encounters:  ?06/23/21 173  lb 3.2 oz (78.6 kg)  ?04/02/21 172 lb 12.8 oz (78.4 kg)  ?12/24/20 174 lb 9.6 oz (79.2 kg)  ? ? ?Physical Exam ?Constitutional:   ?   General: She is not in acute distress. ?   Appearance: She is not diaphoretic.  ?HENT:  ?   Right Ear: Tympanic membrane normal.  ?   Left Ear: Tympanic membrane normal.  ?Cardiovascular:  ?   Rate and Rhythm: Normal rate and regular rhythm.  ?   Heart sounds: Normal heart sounds.  ?Pulmonary:  ?   Effort: Pulmonary effort is normal.  ?   Breath sounds: Normal breath sounds.  ?Lymphadenopathy:  ?   Cervical: No cervical adenopathy.  ?Skin: ?   General: Skin is warm and dry.  ?Neurological:  ?   Mental Status: She is alert.  ? ? ? ?Assessment/Plan: Please see individual problem list. ? ?Problem List Items Addressed This Visit   ? ? Viral respiratory  illness  ?  Patient symptoms are likely viral in nature.  Rapid COVID test today was negative.  We will treat her cough with Tussionex.  She was counseled on the risk of drowsiness with this medication and advised not to drive if she is drowsy while taking this.  Discussed if she is not improving over the next few days she can let us know and we would consider antibiotics at that point.  If she develops any worsening symptoms she will contact us right away. ? ?  ?  ? Relevant Medications  ? chlorpheniramine-HYDROcodone (TUSSIONEX PENNKINETIC ER) 10-8 MG/5ML  ? Other Relevant Orders  ? POC COVID-19 (Completed)  ? ? ? ?Return if symptoms worsen or fail to improve. ? ? ?Tommi Rumps, MD ?[place] ? ?

## 2021-06-23 NOTE — Assessment & Plan Note (Addendum)
Patient symptoms are likely viral in nature.  Rapid COVID test today was negative.  We will treat her cough with Tussionex.  She was counseled on the risk of drowsiness with this medication and advised not to drive if she is drowsy while taking this.  Discussed if she is not improving over the next few days she can let us know and we would consider antibiotics at that point.  If she develops any worsening symptoms she will contact us right away. ?

## 2021-06-23 NOTE — Patient Instructions (Signed)
Nice to see you. ?You likely have a viral respiratory illness.  You should start to improve in the next few days. ?We will treat your cough with Tussionex.  This could make you drowsy.  If it makes you excessively drowsy please discontinue using it.  Please do not drive if you are drowsy while taking this. ?If your symptoms are not improving over the next few days please let us know. ?

## 2021-06-25 ENCOUNTER — Telehealth: Payer: Self-pay

## 2021-06-25 MED ORDER — AMOXICILLIN-POT CLAVULANATE 875-125 MG PO TABS: 1.0000 | ORAL_TABLET | Freq: Two times a day (BID) | ORAL | 0 refills | Status: DC

## 2021-06-25 NOTE — Telephone Encounter (Signed)
Noted.  I sent Augmentin to the pharmacy for her to start on.  If she is not improving within 4 to 5 days of taking this she should let us know.  If she has any worsening symptoms she needs to be reevaluated. ?

## 2021-06-25 NOTE — Telephone Encounter (Signed)
I called and spoke with the patient and informed her that the provider sent in a antibiotic for her and if her symptoms does not improve let us know but if she gets worse she needs to be seen again and she understood. Cortnee Steinmiller,cma  ?

## 2021-06-25 NOTE — Telephone Encounter (Signed)
Patient called to say she saw Dr. Caryl Bis on Monday of this week (06/23/2021), and he said if she wasn't better by Thursday, to give [place] a call.  Patient states she knows it's Wednesday, but she isn't getting any better and she doesn't feel like she can go through another night like this.  She still has a terrible cough and congestion.  Please call. ?

## 2021-07-02 ENCOUNTER — Ambulatory Visit (INDEPENDENT_AMBULATORY_CARE_PROVIDER_SITE_OTHER): Payer: Medicare Other | Admitting: Family Medicine

## 2021-07-02 ENCOUNTER — Encounter: Payer: Self-pay | Admitting: Family Medicine

## 2021-07-02 ENCOUNTER — Ambulatory Visit (INDEPENDENT_AMBULATORY_CARE_PROVIDER_SITE_OTHER): Payer: Medicare Other

## 2021-07-02 VITALS — BP 90/60 | HR 98 | Temp 98.1°F | Ht 62.0 in | Wt 172.8 lb

## 2021-07-02 DIAGNOSIS — I10 Essential (primary) hypertension: Secondary | ICD-10-CM | POA: Diagnosis not present

## 2021-07-02 DIAGNOSIS — R059 Cough, unspecified: Secondary | ICD-10-CM | POA: Diagnosis not present

## 2021-07-02 DIAGNOSIS — J40 Bronchitis, not specified as acute or chronic: Secondary | ICD-10-CM | POA: Diagnosis not present

## 2021-07-02 DIAGNOSIS — G8929 Other chronic pain: Secondary | ICD-10-CM

## 2021-07-02 DIAGNOSIS — M545 Low back pain, unspecified: Secondary | ICD-10-CM

## 2021-07-02 DIAGNOSIS — R7303 Prediabetes: Secondary | ICD-10-CM

## 2021-07-02 MED ORDER — AZITHROMYCIN 250 MG PO TABS: ORAL_TABLET | ORAL | 0 refills | Status: AC

## 2021-07-02 NOTE — Assessment & Plan Note (Signed)
Given persistent symptoms we will check a chest x-ray.  We will trial azithromycin as well.  If not improving she will let us know. ?

## 2021-07-02 NOTE — Assessment & Plan Note (Signed)
She will continue to see her specialist. 

## 2021-07-02 NOTE — Progress Notes (Signed)
?Tommi Rumps, MD ?Phone: [phone] ? ?Nicole Washington is a 71 y.o. female who presents today for follow-up. ? ?HYPERTENSION ?Disease Monitoring ?Home BP Monitoring not checking chest pain-no.    Dyspnea-no ?Medications ?Compliance-taking amlodipine, Coreg, losartan.  Edema-no. ?BMET ?   ?Component Value Date/Time  ? NA 134 (L) 09/18/2020 0956  ? NA 140 11/15/2018 1503  ? K 4.2 09/18/2020 0956  ? CL 94 (L) 09/18/2020 0956  ? CO2 29 09/18/2020 0956  ? GLUCOSE 104 (H) 09/18/2020 0956  ? BUN 18 09/18/2020 0956  ? BUN 17 11/15/2018 1503  ? CREATININE 0.79 09/18/2020 0956  ? CALCIUM 9.6 09/18/2020 0956  ? GFRNONAA 66 11/15/2018 1503  ? GFRAA 76 11/15/2018 1503  ? ?Obesity: Diets not been so good.  She notes its been better over the past week.  Has not been able to exercise much recently. ? ?Respiratory infection: Patient notes she just completed Augmentin.  Her cough is not any better.  She has occasional rhinorrhea and postnasal drip.  Cough is somewhat productive.  She notes a little shortness of breath since she has had the cough only if she is doing something. ? ?Chronic back pain: She has seen orthopedics and they plan to do an injection and burn a nerve to see if that will help. ? ?Social History  ? ?Tobacco Use  ?Smoking Status Former  ? Types: Cigarettes  ? Quit date: 12/20/1989  ? Years since quitting: 31.5  ?Smokeless Tobacco Never  ? ? ?Current Outpatient Medications on File Prior to Visit  ?Medication Sig Dispense Refill  ? amitriptyline (ELAVIL) 10 MG tablet amitriptyline 10 mg tablet ? Take 1 tablet twice a day by oral route for 90 days.    ? amLODipine (NORVASC) 5 MG tablet TAKE 1 TABLET BY MOUTH EVERY DAY 90 tablet 1  ? amoxicillin-clavulanate (AUGMENTIN) 875-125 MG tablet Take 1 tablet by mouth 2 (two) times daily. 14 tablet 0  ? aspirin EC 81 MG tablet Take 81 mg by mouth daily.    ? atorvastatin (LIPITOR) 40 MG tablet TAKE 1 TABLET BY MOUTH EVERY DAY 90 tablet 1  ? carvedilol (COREG) 3.125 MG tablet  TAKE 1 TABLET (3.125 MG TOTAL) BY MOUTH 2 (TWO) TIMES DAILY WITH A MEAL. 180 tablet 1  ? chlorpheniramine-HYDROcodone (TUSSIONEX PENNKINETIC ER) 10-8 MG/5ML Take 5 mLs by mouth every 12 (twelve) hours as needed for cough. 115 mL 0  ? clonazePAM (KLONOPIN) 0.5 MG tablet TAKE TWO TABLETS BY MOUTH DAILY AS NEEDED FOR ANXIETY 60 tablet 0  ? DULoxetine (CYMBALTA) 60 MG capsule Take 60 mg by mouth 2 (two) times daily.    ? losartan (COZAAR) 50 MG tablet TAKE 1 TABLET BY MOUTH EVERY DAY 90 tablet 3  ? metroNIDAZOLE (METROCREAM) 0.75 % cream Apply topically 2 (two) times daily. 45 g 2  ? omeprazole (PRILOSEC) 20 MG capsule TAKE 1 CAPSULE BY MOUTH EVERY DAY 90 capsule 1  ? pregabalin (LYRICA) 150 MG capsule Take 150 mg by mouth 2 (two) times daily.    ? sertraline (ZOLOFT) 100 MG tablet Take 100 mg by mouth daily.    ? traZODone (DESYREL) 100 MG tablet Take 100 mg by mouth at bedtime.    ? tretinoin (RETIN-A) 0.05 % cream Apply topically at bedtime. Qhs as tolerated to face for bumps 45 g 4  ? ?No current facility-administered medications on file prior to visit.  ? ? ? ?ROS see history of present illness ? ?Objective ? ?Physical Exam ?Vitals:  ?  07/02/21 1445  ?BP: 90/60  ?Pulse: 98  ?Temp: 98.1 ?F (36.7 ?C)  ?SpO2: 94%  ? ? ?BP Readings from Last 3 Encounters:  ?07/02/21 90/60  ?06/23/21 130/80  ?04/02/21 120/80  ? ?Wt Readings from Last 3 Encounters:  ?07/02/21 172 lb 12.8 oz (78.4 kg)  ?06/23/21 173 lb 3.2 oz (78.6 kg)  ?04/02/21 172 lb 12.8 oz (78.4 kg)  ? ? ?Physical Exam ?Constitutional:   ?   General: She is not in acute distress. ?   Appearance: She is not diaphoretic.  ?Cardiovascular:  ?   Rate and Rhythm: Normal rate and regular rhythm.  ?   Heart sounds: Normal heart sounds.  ?Pulmonary:  ?   Effort: Pulmonary effort is normal.  ?   Breath sounds: Normal breath sounds.  ?Skin: ?   General: Skin is warm and dry.  ?Neurological:  ?   Mental Status: She is alert.  ? ? ? ?Assessment/Plan: Please see individual  problem list. ? ?Problem List Items Addressed This Visit   ? ? Hypertension (Chronic)  ?  Well-controlled.  Blood pressure was 98/68 on recheck.  She will continue amlodipine 5 mg daily, carvedilol 3.125 mg twice daily, and losartan 50 mg daily.  Check lab work. ? ?  ?  ? Relevant Orders  ? Basic Metabolic Panel (BMET)  ? Prediabetes (Chronic)  ?  Check A1c. ? ?  ?  ? Relevant Orders  ? HgB A1c  ? Bronchitis - Primary  ?  Given persistent symptoms we will check a chest x-ray.  We will trial azithromycin as well.  If not improving she will let us know. ? ?  ?  ? Relevant Medications  ? azithromycin (ZITHROMAX) 250 MG tablet  ? Other Relevant Orders  ? DG Chest 2 View  ? Chronic low back pain  ?  She will continue to see her specialist. ? ?  ?  ? ? ? ? ?Return in about 3 months (around 10/02/2021). ? ? ?Tommi Rumps, MD ?[place] ? ?

## 2021-07-02 NOTE — Patient Instructions (Signed)
Nice to see you. ?We will treat you with a different antibiotic and get an x-ray today. ?

## 2021-07-02 NOTE — Assessment & Plan Note (Signed)
Well-controlled.  Blood pressure was 98/68 on recheck.  She will continue amlodipine 5 mg daily, carvedilol 3.125 mg twice daily, and losartan 50 mg daily.  Check lab work. ?

## 2021-07-02 NOTE — Assessment & Plan Note (Signed)
Check A1c. 

## 2021-07-03 LAB — BASIC METABOLIC PANEL
BUN: 17 mg/dL (ref 6–23)
CO2: 29 mEq/L (ref 19–32)
Calcium: 9.5 mg/dL (ref 8.4–10.5)
Chloride: 99 mEq/L (ref 96–112)
Creatinine, Ser: 0.91 mg/dL (ref 0.40–1.20)
GFR: 63.31 mL/min (ref >60.00)
Glucose, Bld: 108 mg/dL — ABNORMAL HIGH (ref 70–99)
Potassium: 4.2 mEq/L (ref 3.5–5.1)
Sodium: 138 mEq/L (ref 135–145)

## 2021-07-03 LAB — HEMOGLOBIN A1C: Hgb A1c MFr Bld: 6.4 % (ref 4.6–6.5)

## 2021-07-15 DIAGNOSIS — M47816 Spondylosis without myelopathy or radiculopathy, lumbar region: Secondary | ICD-10-CM | POA: Diagnosis not present

## 2021-07-16 ENCOUNTER — Telehealth: Payer: Self-pay | Admitting: Family Medicine

## 2021-07-16 NOTE — Telephone Encounter (Signed)
Spoke with patient she declined AWV  

## 2021-07-29 ENCOUNTER — Other Ambulatory Visit: Payer: Self-pay | Admitting: Family Medicine

## 2021-08-14 DIAGNOSIS — M1712 Unilateral primary osteoarthritis, left knee: Secondary | ICD-10-CM | POA: Diagnosis not present

## 2021-08-26 ENCOUNTER — Other Ambulatory Visit: Payer: Self-pay | Admitting: Family Medicine

## 2021-09-12 DIAGNOSIS — F4312 Post-traumatic stress disorder, chronic: Secondary | ICD-10-CM | POA: Diagnosis not present

## 2021-09-12 DIAGNOSIS — F5105 Insomnia due to other mental disorder: Secondary | ICD-10-CM | POA: Diagnosis not present

## 2021-09-12 DIAGNOSIS — F39 Unspecified mood [affective] disorder: Secondary | ICD-10-CM | POA: Diagnosis not present

## 2021-09-12 DIAGNOSIS — F411 Generalized anxiety disorder: Secondary | ICD-10-CM | POA: Diagnosis not present

## 2021-10-01 ENCOUNTER — Ambulatory Visit: Payer: Medicare Other | Admitting: Dermatology

## 2021-10-07 DIAGNOSIS — M47816 Spondylosis without myelopathy or radiculopathy, lumbar region: Secondary | ICD-10-CM | POA: Diagnosis not present

## 2021-10-09 DIAGNOSIS — M25512 Pain in left shoulder: Secondary | ICD-10-CM | POA: Diagnosis not present

## 2021-10-13 ENCOUNTER — Other Ambulatory Visit: Payer: Self-pay | Admitting: Family Medicine

## 2021-10-15 DIAGNOSIS — M47896 Other spondylosis, lumbar region: Secondary | ICD-10-CM | POA: Diagnosis not present

## 2021-10-15 DIAGNOSIS — M6281 Muscle weakness (generalized): Secondary | ICD-10-CM | POA: Diagnosis not present

## 2021-10-15 DIAGNOSIS — M5136 Other intervertebral disc degeneration, lumbar region: Secondary | ICD-10-CM | POA: Diagnosis not present

## 2021-10-23 DIAGNOSIS — M5136 Other intervertebral disc degeneration, lumbar region: Secondary | ICD-10-CM | POA: Diagnosis not present

## 2021-10-23 DIAGNOSIS — M6281 Muscle weakness (generalized): Secondary | ICD-10-CM | POA: Diagnosis not present

## 2021-10-23 DIAGNOSIS — M47896 Other spondylosis, lumbar region: Secondary | ICD-10-CM | POA: Diagnosis not present

## 2021-10-28 ENCOUNTER — Ambulatory Visit (INDEPENDENT_AMBULATORY_CARE_PROVIDER_SITE_OTHER): Payer: Medicare Other | Admitting: Dermatology

## 2021-10-28 ENCOUNTER — Encounter: Payer: Self-pay | Admitting: Dermatology

## 2021-10-28 DIAGNOSIS — D2261 Melanocytic nevi of right upper limb, including shoulder: Secondary | ICD-10-CM | POA: Diagnosis not present

## 2021-10-28 DIAGNOSIS — L820 Inflamed seborrheic keratosis: Secondary | ICD-10-CM

## 2021-10-28 DIAGNOSIS — L814 Other melanin hyperpigmentation: Secondary | ICD-10-CM | POA: Diagnosis not present

## 2021-10-28 DIAGNOSIS — L720 Epidermal cyst: Secondary | ICD-10-CM | POA: Diagnosis not present

## 2021-10-28 DIAGNOSIS — Z1283 Encounter for screening for malignant neoplasm of skin: Secondary | ICD-10-CM | POA: Diagnosis not present

## 2021-10-28 DIAGNOSIS — L821 Other seborrheic keratosis: Secondary | ICD-10-CM | POA: Diagnosis not present

## 2021-10-28 DIAGNOSIS — L739 Follicular disorder, unspecified: Secondary | ICD-10-CM

## 2021-10-28 DIAGNOSIS — L578 Other skin changes due to chronic exposure to nonionizing radiation: Secondary | ICD-10-CM

## 2021-10-28 DIAGNOSIS — D1800 Hemangioma unspecified site: Secondary | ICD-10-CM

## 2021-10-28 DIAGNOSIS — D229 Melanocytic nevi, unspecified: Secondary | ICD-10-CM

## 2021-10-28 DIAGNOSIS — Z85828 Personal history of other malignant neoplasm of skin: Secondary | ICD-10-CM | POA: Diagnosis not present

## 2021-10-28 NOTE — Patient Instructions (Addendum)
Scalp: Recommend using Ketoconazole 2% shampoo alternating with Clobetasol shampoo. Call for refills  Start Clobetasol solution apply twice daily to affected areas on scalp as needed for itching.   Avoid applying to face, groin, and axilla. Use as directed. Long-term use can cause thinning of the skin.  Topical steroids (such as triamcinolone, fluocinolone, fluocinonide, mometasone, clobetasol, halobetasol, betamethasone, hydrocortisone) can cause thinning and lightening of the skin if they are used for too long in the same area. Your physician has selected the right strength medicine for your problem and area affected on the body. Please use your medication only as directed by your physician to prevent side effects.   Cryotherapy Aftercare  Wash gently with soap and water everyday.   Apply Vaseline daily until healed.    Recommend daily broad spectrum sunscreen SPF 30+ to sun-exposed areas, reapply every 2 hours as needed. Call for new or changing lesions.  Staying in the shade or wearing long sleeves, sun glasses (UVA+UVB protection) and wide brim hats (4-inch brim around the entire circumference of the hat) are also recommended for sun protection.    Melanoma ABCDEs  Melanoma is the most dangerous type of skin cancer, and is the leading cause of death from skin disease.  You are more likely to develop melanoma if you: Have light-colored skin, light-colored eyes, or red or blond hair Spend a lot of time in the sun Tan regularly, either outdoors or in a tanning bed Have had blistering sunburns, especially during childhood Have a close family member who has had a melanoma Have atypical moles or large birthmarks  Early detection of melanoma is key since treatment is typically straightforward and cure rates are extremely high if we catch it early.   The first sign of melanoma is often a change in a mole or a new dark spot.  The ABCDE system is a way of remembering the signs of  melanoma.  A for asymmetry:  The two halves do not match. B for border:  The edges of the growth are irregular. C for color:  A mixture of colors are present instead of an even brown color. D for diameter:  Melanomas are usually (but not always) greater than 55m - the size of a pencil eraser. E for evolution:  The spot keeps changing in size, shape, and color.  Please check your skin once per month between visits. You can use a small mirror in front and a large mirror behind you to keep an eye on the back side or your body.   If you see any new or changing lesions before your next follow-up, please call to schedule a visit.  Please continue daily skin protection including broad spectrum sunscreen SPF 30+ to sun-exposed areas, reapplying every 2 hours as needed when you're outdoors.   Staying in the shade or wearing long sleeves, sun glasses (UVA+UVB protection) and wide brim hats (4-inch brim around the entire circumference of the hat) are also recommended for sun protection.     Due to recent changes in healthcare laws, you may see results of your pathology and/or laboratory studies on MyChart before the doctors have had a chance to review them. We understand that in some cases there may be results that are confusing or concerning to you. Please understand that not all results are received at the same time and often the doctors may need to interpret multiple results in order to provide you with the best plan of care or course of treatment. Therefore,  we ask that you please give [place] 2 business days to thoroughly review all your results before contacting the office for clarification. Should we see a critical lab result, you will be contacted sooner.   If You Need Anything After Your Visit  If you have any questions or concerns for your doctor, please call our main line at [phone] and press option 4 to reach your doctor's medical assistant. If no one answers, please leave a voicemail as  directed and we will return your call as soon as possible. Messages left after 4 pm will be answered the following business day.   You may also send [place] a message via [place]. We typically respond to MyChart messages within 1-2 business days.  For prescription refills, please ask your pharmacy to contact our office. Our fax number is [phone].  If you have an urgent issue when the clinic is closed that cannot wait until the next business day, you can page your doctor at the number below.    Please note that while we do our best to be available for urgent issues outside of office hours, we are not available 24/7.   If you have an urgent issue and are unable to reach [place], you may choose to seek medical care at your doctor's office, retail clinic, urgent care center, or emergency room.  If you have a medical emergency, please immediately call 911 or go to the emergency department.  Pager Numbers  - Dr. Nehemiah Massed: [phone]  - Dr. Laurence Ferrari: [phone]  - Dr. Nicole Kindred: [phone]  In the event of inclement weather, please call our main line at [phone] for an update on the status of any delays or closures.  Dermatology Medication Tips: Please keep the boxes that topical medications come in in order to help keep track of the instructions about where and how to use these. Pharmacies typically print the medication instructions only on the boxes and not directly on the medication tubes.   If your medication is too expensive, please contact our office at [phone] option 4 or send [place] a message through [place].   We are unable to tell what your co-pay for medications will be in advance as this is different depending on your insurance coverage. However, we may be able to find a substitute medication at lower cost or fill out paperwork to get insurance to cover a needed medication.   If a prior authorization is required to get your medication covered by your insurance company, please  allow [place] 1-2 business days to complete this process.  Drug prices often vary depending on where the prescription is filled and some pharmacies may offer cheaper prices.  The website www.goodrx.com contains coupons for medications through different pharmacies. The prices here do not account for what the cost may be with help from insurance (it may be cheaper with your insurance), but the website can give you the price if you did not use any insurance.  - You can print the associated coupon and take it with your prescription to the pharmacy.  - You may also stop by our office during regular business hours and pick up a GoodRx coupon card.  - If you need your prescription sent electronically to a different pharmacy, notify our office through [facility] or by phone at [phone] option 4.     Si Usted Necesita Algo Despus de Su Visita  Tambin puede enviarnos un mensaje a travs de Pharmacist, community. Por lo general respondemos a los mensajes de [employer]  en el transcurso de 1 a 2 das hbiles.  Para renovar recetas, por favor pida a su farmacia que se ponga en contacto con nuestra oficina. Harland Dingwall de fax es [place] [phone].  Si tiene un asunto urgente cuando la clnica est cerrada y que no puede esperar hasta el siguiente da hbil, puede llamar/localizar a su doctor(a) al nmero que aparece a continuacin.   Por favor, tenga en cuenta que aunque hacemos todo lo posible para estar disponibles para asuntos urgentes fuera del horario de [place], no estamos disponibles las 24 horas del da, los 7 das de la [place].   Si tiene un problema urgente y no puede comunicarse con nosotros, puede optar por buscar atencin mdica  en el consultorio de su doctor(a), en una clnica privada, en un centro de atencin urgente o en una sala de emergencias.  Si tiene Engineering geologist, por favor llame inmediatamente al 911 o vaya a la sala de emergencias.  Nmeros de bper  - Dr. Nehemiah Massed:  [phone]  - Dra. Moye: [phone]  - Dra. Nicole Kindred: [phone]  En caso de inclemencias del [place], por favor llame a Johnsie Kindred principal al [phone] para una actualizacin sobre el [place] de cualquier retraso o cierre.  Consejos para la medicacin en dermatologa: Por favor, guarde las cajas en las que vienen los medicamentos de uso tpico para ayudarle a seguir las instrucciones sobre dnde y cmo usarlos. Las farmacias generalmente imprimen las instrucciones del medicamento slo en las cajas y no directamente en los tubos del [place].   Si su medicamento es muy caro, por favor, pngase en contacto con Zigmund Daniel llamando al [phone] y presione la opcin 4 o envenos un mensaje a travs de Pharmacist, community.   No podemos decirle cul ser su copago por los medicamentos por adelantado ya que esto es diferente dependiendo de la cobertura de su seguro. Sin embargo, es posible que podamos encontrar un medicamento sustituto a Electrical engineer un formulario para que el seguro cubra el medicamento que se considera necesario.   Si se requiere una autorizacin previa para que su compaa de seguros [place] su medicamento, por favor permtanos de 1 a 2 das hbiles para completar este proceso.  Los precios de los medicamentos varan con frecuencia dependiendo del Environmental consultant de dnde se surte la receta y alguna farmacias pueden ofrecer precios ms baratos.  El sitio web www.goodrx.com tiene cupones para medicamentos de Airline pilot. Los precios aqu no tienen en cuenta lo que podra costar con la ayuda del seguro (puede ser ms barato con su seguro), pero el sitio web puede darle el precio si no utiliz Research scientist (physical sciences).  - Puede imprimir el cupn correspondiente y llevarlo con su receta a la farmacia.  - Tambin puede pasar por nuestra oficina durante el horario de atencin regular y Charity fundraiser una tarjeta de cupones de GoodRx.  - Si necesita que su receta se enve electrnicamente a  una farmacia diferente, informe a nuestra oficina a travs de MyChart de [place] o por telfono llamando al [phone] y presione la opcin 4.

## 2021-10-28 NOTE — Progress Notes (Signed)
Follow-Up Visit   Subjective  Nicole Washington is a 71 y.o. female who presents for the following: Annual Exam (HxSCC, HxBCC). The patient presents for Total-Body Skin Exam (TBSE) for skin cancer screening and mole check.  The patient has spots, moles and lesions to be evaluated, some may be new or changing and the patient has concerns that these could be cancer.  She has several spots on face she would like removed.   The following portions of the chart were reviewed this encounter and updated as appropriate:      Review of Systems: No other skin or systemic complaints except as noted in HPI or Assessment and Plan.   Objective  Well appearing patient in no apparent distress; mood and affect are within normal limits.  A full examination was performed including scalp, head, eyes, ears, nose, lips, neck, chest, axillae, abdomen, back, buttocks, bilateral upper extremities, bilateral lower extremities, hands, feet, fingers, toes, fingernails, and toenails. All findings within normal limits unless otherwise noted below.  left pretibia Light pink-tan waxy macule  Scalp Scattered pink follicular based papules  Right Anterior Wrist R ant wrist 3.37m brown speckled waxy pap  right cheek x 2, right upper cutaneous lip x1 (3) Smooth white papule(s).      Right Medial Cheek x10 (10) Stuck-on, waxy, tan-brown papules and plaques -- Discussed benign etiology and prognosis.       Assessment & Plan   History of Squamous Cell Carcinoma of the Skin - No evidence of recurrence today - Recommend regular full body skin exams - Recommend daily broad spectrum sunscreen SPF 30+ to sun-exposed areas, reapply every 2 hours as needed.  - Call if any new or changing lesions are noted between office visits  History of Basal Cell Carcinoma of the Skin - No evidence of recurrence today - Recommend regular full body skin exams - Recommend daily broad spectrum sunscreen SPF 30+ to sun-exposed  areas, reapply every 2 hours as needed.  - Call if any new or changing lesions are noted between office visits   Lentigines - Scattered tan macules - Due to sun exposure - Benign-appearing, observe - Recommend daily broad spectrum sunscreen SPF 30+ to sun-exposed areas, reapply every 2 hours as needed. - Call for any changes  Seborrheic Keratoses - Stuck-on, waxy, tan-brown papules and/or plaques  - Benign-appearing - Discussed benign etiology and prognosis. - Observe - Call for any changes  Melanocytic Nevi - Tan-brown and/or pink-flesh-colored symmetric macules and papules - Benign appearing on exam today - Observation - Call clinic for new or changing moles - Recommend daily use of broad spectrum spf 30+ sunscreen to sun-exposed areas.   Hemangiomas - Red papules - Discussed benign nature - Observe - Call for any changes  Actinic Damage - Chronic condition, secondary to cumulative UV/sun exposure - diffuse scaly erythematous macules with underlying dyspigmentation - Recommend daily broad spectrum sunscreen SPF 30+ to sun-exposed areas, reapply every 2 hours as needed.  - Staying in the shade or wearing long sleeves, sun glasses (UVA+UVB protection) and wide brim hats (4-inch brim around the entire circumference of the hat) are also recommended for sun protection.  - Call for new or changing lesions.  Skin cancer screening performed today.    Inflamed seborrheic keratosis left pretibia  Benign, observe. asymptomatic  Reassured benign age-related growth.  Recommend observation.  Discussed cryotherapy if spot(s) become irritated or inflamed.    Folliculitis Scalp  Chronic and persistent condition with duration or expected duration  over one year. Condition is symptomatic/ bothersome to patient. Not currently at goal.   Recommend using Ketoconazole 2% shampoo alternating with Clobetasol shampoo. Call for refills  Start Clobetasol solution apply twice daily to  affected areas on scalp as needed for itching.   Avoid applying to face, groin, and axilla. Use as directed. Long-term use can cause thinning of the skin.  Topical steroids (such as triamcinolone, fluocinolone, fluocinonide, mometasone, clobetasol, halobetasol, betamethasone, hydrocortisone) can cause thinning and lightening of the skin if they are used for too long in the same area. Your physician has selected the right strength medicine for your problem and area affected on the body. Please use your medication only as directed by your physician to prevent side effects.    Nevus Right Anterior Wrist  Vrs SK, Benign-appearing.  Observation.  Call clinic for new or changing lesions.  Recommend daily use of broad spectrum spf 30+ sunscreen to sun-exposed areas.    Milia (3) right cheek x 2, right upper cutaneous lip x1  Discussed cosmetic procedure (extraction), noncovered.  $60 for 1st lesion and $15 for each additional lesion if done on the same day.  Maximum charge $350.  One touch-up treatment included no charge. Discussed risks of treatment including dyspigmentation, small scar, and/or recurrence. Recommend daily broad spectrum sunscreen SPF 30+/photoprotection to treated areas once healed.   Acne/Milia surgery - right cheek x 2, right upper cutaneous lip x1 Procedure risks and benefits were discussed with the patient and verbal consent was obtained. Following prep of the skin on the face with an alcohol swab, extraction of milia was performed with a comedone extractor following superficial incision made over their surfaces with a #15 surgical blade. Capillary hemostasis was achieved with 20% aluminum chloride solution. Vaseline ointment was applied to each site. The patient tolerated the procedure well.  Seborrheic keratosis (10) Right Medial Cheek x10  Asymptomatic. Reassured benign age-related growth.  Recommend observation. Pt would like removed  Discussed cosmetic procedure  (cryotherapy), noncovered.  $60 for 1st lesion and $15 for each additional lesion if done on the same day.  Maximum charge $350.  One touch-up treatment included no charge. Discussed risks of treatment including dyspigmentation, small scar, and/or recurrence. Recommend daily broad spectrum sunscreen SPF 30+/photoprotection to treated areas once healed.   Destruction of lesion - Right Medial Cheek x10  Destruction method: cryotherapy   Informed consent: discussed and consent obtained   Lesion destroyed using liquid nitrogen: Yes   Region frozen until ice ball extended beyond lesion: Yes   Outcome: patient tolerated procedure well with no complications   Post-procedure details: wound care instructions given   Additional details:  Prior to procedure, discussed risks of blister formation, small wound, skin dyspigmentation, or rare scar following cryotherapy. Recommend Vaseline ointment to treated areas while healing.    Return in about 1 year (around 10/29/2022) for TBSE, HxBCC, HxSCC; folliculitis recheck, SK recheck in 2 months.  I, Emelia Salisbury, CMA, am acting as scribe for Brendolyn Patty, MD.  Documentation: I have reviewed the above documentation for accuracy and completeness, and I agree with the above.  Brendolyn Patty MD

## 2021-10-30 DIAGNOSIS — M47896 Other spondylosis, lumbar region: Secondary | ICD-10-CM | POA: Diagnosis not present

## 2021-10-30 DIAGNOSIS — M5136 Other intervertebral disc degeneration, lumbar region: Secondary | ICD-10-CM | POA: Diagnosis not present

## 2021-10-30 DIAGNOSIS — M6281 Muscle weakness (generalized): Secondary | ICD-10-CM | POA: Diagnosis not present

## 2021-11-13 DIAGNOSIS — M47896 Other spondylosis, lumbar region: Secondary | ICD-10-CM | POA: Diagnosis not present

## 2021-11-13 DIAGNOSIS — M6281 Muscle weakness (generalized): Secondary | ICD-10-CM | POA: Diagnosis not present

## 2021-11-13 DIAGNOSIS — M5136 Other intervertebral disc degeneration, lumbar region: Secondary | ICD-10-CM | POA: Diagnosis not present

## 2021-11-20 DIAGNOSIS — M47896 Other spondylosis, lumbar region: Secondary | ICD-10-CM | POA: Diagnosis not present

## 2021-11-20 DIAGNOSIS — M5136 Other intervertebral disc degeneration, lumbar region: Secondary | ICD-10-CM | POA: Diagnosis not present

## 2021-11-20 DIAGNOSIS — M6281 Muscle weakness (generalized): Secondary | ICD-10-CM | POA: Diagnosis not present

## 2021-12-04 ENCOUNTER — Other Ambulatory Visit: Payer: Self-pay | Admitting: Family Medicine

## 2021-12-04 DIAGNOSIS — M5136 Other intervertebral disc degeneration, lumbar region: Secondary | ICD-10-CM | POA: Diagnosis not present

## 2021-12-04 DIAGNOSIS — M47816 Spondylosis without myelopathy or radiculopathy, lumbar region: Secondary | ICD-10-CM | POA: Diagnosis not present

## 2021-12-05 ENCOUNTER — Other Ambulatory Visit: Payer: Self-pay | Admitting: Family Medicine

## 2021-12-09 DIAGNOSIS — F411 Generalized anxiety disorder: Secondary | ICD-10-CM | POA: Diagnosis not present

## 2021-12-09 DIAGNOSIS — F4312 Post-traumatic stress disorder, chronic: Secondary | ICD-10-CM | POA: Diagnosis not present

## 2021-12-09 DIAGNOSIS — F39 Unspecified mood [affective] disorder: Secondary | ICD-10-CM | POA: Diagnosis not present

## 2021-12-09 DIAGNOSIS — F5105 Insomnia due to other mental disorder: Secondary | ICD-10-CM | POA: Diagnosis not present

## 2021-12-10 DIAGNOSIS — H35371 Puckering of macula, right eye: Secondary | ICD-10-CM | POA: Diagnosis not present

## 2021-12-10 DIAGNOSIS — H2513 Age-related nuclear cataract, bilateral: Secondary | ICD-10-CM | POA: Diagnosis not present

## 2021-12-10 DIAGNOSIS — H35372 Puckering of macula, left eye: Secondary | ICD-10-CM | POA: Diagnosis not present

## 2021-12-11 DIAGNOSIS — M545 Low back pain, unspecified: Secondary | ICD-10-CM | POA: Diagnosis not present

## 2021-12-18 DIAGNOSIS — E6609 Other obesity due to excess calories: Secondary | ICD-10-CM | POA: Diagnosis not present

## 2021-12-18 DIAGNOSIS — G4733 Obstructive sleep apnea (adult) (pediatric): Secondary | ICD-10-CM | POA: Diagnosis not present

## 2021-12-18 DIAGNOSIS — K146 Glossodynia: Secondary | ICD-10-CM | POA: Diagnosis not present

## 2021-12-18 DIAGNOSIS — Z6832 Body mass index (BMI) 32.0-32.9, adult: Secondary | ICD-10-CM | POA: Diagnosis not present

## 2021-12-18 DIAGNOSIS — F4310 Post-traumatic stress disorder, unspecified: Secondary | ICD-10-CM | POA: Diagnosis not present

## 2021-12-18 DIAGNOSIS — M797 Fibromyalgia: Secondary | ICD-10-CM | POA: Diagnosis not present

## 2021-12-22 DIAGNOSIS — M47816 Spondylosis without myelopathy or radiculopathy, lumbar region: Secondary | ICD-10-CM | POA: Diagnosis not present

## 2021-12-23 ENCOUNTER — Ambulatory Visit (INDEPENDENT_AMBULATORY_CARE_PROVIDER_SITE_OTHER): Payer: Medicare Other | Admitting: Dermatology

## 2021-12-23 DIAGNOSIS — L814 Other melanin hyperpigmentation: Secondary | ICD-10-CM

## 2021-12-23 DIAGNOSIS — L578 Other skin changes due to chronic exposure to nonionizing radiation: Secondary | ICD-10-CM

## 2021-12-23 DIAGNOSIS — L821 Other seborrheic keratosis: Secondary | ICD-10-CM

## 2021-12-23 NOTE — Progress Notes (Signed)
Follow-Up Visit   Subjective  Nicole Washington is a 71 y.o. female who presents for the following: Seborrheic Keratosis (Hx of sk at right cheek. Patient treated with cryotherapy at last appointment. Here to have areas rechecked. Patient would like to discuss other treatments that can help with age spots and discoloration at face. ).   The patient has spots, moles and lesions to be evaluated, some may be new or changing and the patient has concerns that these could be cancer.   The following portions of the chart were reviewed this encounter and updated as appropriate:      Review of Systems: No other skin or systemic complaints except as noted in HPI or Assessment and Plan.   Objective  Well appearing patient in no apparent distress; mood and affect are within normal limits.  A focused examination was performed including face. Relevant physical exam findings are noted in the Assessment and Plan.  face and right anterior wrist Tan waxy macules and papules at face and 3 mm waxy tan brown papule at right anterior wrist. R medial cheek is clear, compared to baseline photos  face Scattered tan macules at face   Assessment & Plan  Seborrheic keratosis face and right anterior wrist  Reassured benign age-related growth.  Recommend observation.  Discussed cryotherapy if spot(s) become irritated or inflamed.  Has improved when compared to photos, s/p cosmetic cryotherapy at right cheek.    Recommend cryotherapy for any other raised Sks she would like treated.    Lentigo face  Informational pamphlet given to patient on BBL Discussed the treatment option of BBL/laser. Used to treat flat brown spots and discoloration of face.  Typically we recommend 1-3 treatment sessions about 5-8 weeks apart for best results.  The patient's condition may require "maintenance treatments" in the future.  The fee for BBL / laser treatments is $350 per treatment session for the whole face.  A fee can be  quoted for other parts of the body. Insurance typically does not pay for BBL/laser treatments and therefore the fee is an out-of-pocket cost.  Pt interested in scheduling.  Recommend daily broad spectrum sunscreen SPF 30+ to sun-exposed areas, reapply every 2 hours as needed. Call for new or changing lesions.  Staying in the shade or wearing long sleeves, sun glasses (UVA+UVB protection) and wide brim hats (4-inch brim around the entire circumference of the hat) are also recommended for sun protection.    Seborrheic Keratoses - Stuck-on, waxy, tan-brown papules and/or plaques  - Benign-appearing - Discussed benign etiology and prognosis. - Observe - Call for any changes  Lentigines - Scattered tan macules - Due to sun exposure - Benign-appearing, observe - Recommend daily broad spectrum sunscreen SPF 30+ to sun-exposed areas, reapply every 2 hours as needed. - Call for any changes   Actinic Damage - chronic, secondary to cumulative UV radiation exposure/sun exposure over time - diffuse scaly erythematous macules with underlying dyspigmentation - Recommend daily broad spectrum sunscreen SPF 30+ to sun-exposed areas, reapply every 2 hours as needed.  - Recommend staying in the shade or wearing long sleeves, sun glasses (UVA+UVB protection) and wide brim hats (4-inch brim around the entire circumference of the hat). - Call for new or changing lesions.  Return in about 2 months (around 02/22/2022) for BBL face. I, Ruthell Rummage, CMA, am acting as scribe for Brendolyn Patty, MD.  Documentation: I have reviewed the above documentation for accuracy and completeness, and I agree with the above.  Baxter Flattery  Nicole Kindred MD

## 2021-12-23 NOTE — Patient Instructions (Addendum)
Recommend if you would like treatment before January  Nicole Washington  at [place]      Discussed the treatment option of BBL/laser.  Typically we recommend 1-3 treatment sessions about 5-8 weeks apart for best results.  The patient's condition may require "maintenance treatments" in the future.  The fee for BBL / laser treatments is $350 per treatment session for the whole face.  A fee can be quoted for other parts of the body. Insurance typically does not pay for BBL/laser treatments and therefore the fee is an out-of-pocket cost.    Seborrheic Keratosis  What causes seborrheic keratoses? Seborrheic keratoses are harmless, common skin growths that first appear during adult life.  As time goes by, more growths appear.  Some people may develop a large number of them.  Seborrheic keratoses appear on both covered and uncovered body parts.  They are not caused by sunlight.  The tendency to develop seborrheic keratoses can be inherited.  They vary in color from skin-colored to gray, brown, or even black.  They can be either smooth or have a rough, warty surface.   Seborrheic keratoses are superficial and look as if they were stuck on the skin.  Under the microscope this type of keratosis looks like layers upon layers of skin.  That is why at times the top layer may seem to fall off, but the rest of the growth remains and re-grows.    Treatment Seborrheic keratoses do not need to be treated, but can easily be removed in the office.  Seborrheic keratoses often cause symptoms when they rub on clothing or jewelry.  Lesions can be in the way of shaving.  If they become inflamed, they can cause itching, soreness, or burning.  Removal of a seborrheic keratosis can be accomplished by freezing, burning, or surgery. If any spot bleeds, scabs, or grows rapidly, please return to have it checked, as these can be an indication of a skin cancer.   Due to recent changes in healthcare  laws, you may see results of your pathology and/or laboratory studies on MyChart before the doctors have had a chance to review them. We understand that in some cases there may be results that are confusing or concerning to you. Please understand that not all results are received at the same time and often the doctors may need to interpret multiple results in order to provide you with the best plan of care or course of treatment. Therefore, we ask that you please give [place] 2 business days to thoroughly review all your results before contacting the office for clarification. Should we see a critical lab result, you will be contacted sooner.   If You Need Anything After Your Visit  If you have any questions or concerns for your doctor, please call our main line at [phone] and press option 4 to reach your doctor's medical assistant. If no one answers, please leave a voicemail as directed and we will return your call as soon as possible. Messages left after 4 pm will be answered the following business day.   You may also send [place] a message via [place]. We typically respond to MyChart messages within 1-2 business days.  For prescription refills, please ask your pharmacy to contact our office. Our fax number is [phone].  If you have an urgent issue when the clinic is closed that cannot wait until the next business day, you can page your doctor at the number below.  Please note that while we do our best to be available for urgent issues outside of office hours, we are not available 24/7.   If you have an urgent issue and are unable to reach [place], you may choose to seek medical care at your doctor's office, retail clinic, urgent care center, or emergency room.  If you have a medical emergency, please immediately call 911 or go to the emergency department.  Pager Numbers  - Dr. Nehemiah Massed: [phone]  - Dr. Laurence Ferrari: [phone]  - Dr. Nicole Kindred: [phone]  In the event of inclement weather,  please call our main line at [phone] for an update on the status of any delays or closures.  Dermatology Medication Tips: Please keep the boxes that topical medications come in in order to help keep track of the instructions about where and how to use these. Pharmacies typically print the medication instructions only on the boxes and not directly on the medication tubes.   If your medication is too expensive, please contact our office at [phone] option 4 or send [place] a message through [place].   We are unable to tell what your co-pay for medications will be in advance as this is different depending on your insurance coverage. However, we may be able to find a substitute medication at lower cost or fill out paperwork to get insurance to cover a needed medication.   If a prior authorization is required to get your medication covered by your insurance company, please allow [place] 1-2 business days to complete this process.  Drug prices often vary depending on where the prescription is filled and some pharmacies may offer cheaper prices.  The website www.goodrx.com contains coupons for medications through different pharmacies. The prices here do not account for what the cost may be with help from insurance (it may be cheaper with your insurance), but the website can give you the price if you did not use any insurance.  - You can print the associated coupon and take it with your prescription to the pharmacy.  - You may also stop by our office during regular business hours and pick up a GoodRx coupon card.  - If you need your prescription sent electronically to a different pharmacy, notify our office through [facility] or by phone at [phone] option 4.     Si Usted Necesita Algo Despus de Su Visita  Tambin puede enviarnos un mensaje a travs de Pharmacist, community. Por lo general respondemos a los mensajes de MyChart en el transcurso de 1 a 2 das hbiles.  Para renovar recetas, por favor  pida a su farmacia que se ponga en contacto con nuestra oficina. Harland Dingwall de fax es [place] [phone].  Si tiene un asunto urgente cuando la clnica est cerrada y que no puede esperar hasta el siguiente da hbil, puede llamar/localizar a su doctor(a) al nmero que aparece a continuacin.   Por favor, tenga en cuenta que aunque hacemos todo lo posible para estar disponibles para asuntos urgentes fuera del horario de [place], no estamos disponibles las 24 horas del da, los 7 das de la [place].   Si tiene un problema urgente y no puede comunicarse con nosotros, puede optar por buscar atencin mdica  en el consultorio de su doctor(a), en una clnica privada, en un centro de atencin urgente o en una sala de emergencias.  Si tiene Engineering geologist, por favor llame inmediatamente al 911 o vaya a la sala de emergencias.  Nmeros de bper  - Dr. Nehemiah Massed:  [phone]  - Dra. Moye: [phone]  - Dra. Nicole Kindred: [phone]  En caso de inclemencias del [place], por favor llame a Johnsie Kindred principal al [phone] para una actualizacin sobre el [place] de cualquier retraso o cierre.  Consejos para la medicacin en dermatologa: Por favor, guarde las cajas en las que vienen los medicamentos de uso tpico para ayudarle a seguir las instrucciones sobre dnde y cmo usarlos. Las farmacias generalmente imprimen las instrucciones del medicamento slo en las cajas y no directamente en los tubos del [place].   Si su medicamento es muy caro, por favor, pngase en contacto con Zigmund Daniel llamando al [phone] y presione la opcin 4 o envenos un mensaje a travs de Pharmacist, community.   No podemos decirle cul ser su copago por los medicamentos por adelantado ya que esto es diferente dependiendo de la cobertura de su seguro. Sin embargo, es posible que podamos encontrar un medicamento sustituto a Electrical engineer un formulario para que el seguro cubra el medicamento que se considera  necesario.   Si se requiere una autorizacin previa para que su compaa de seguros [place] su medicamento, por favor permtanos de 1 a 2 das hbiles para completar este proceso.  Los precios de los medicamentos varan con frecuencia dependiendo del Environmental consultant de dnde se surte la receta y alguna farmacias pueden ofrecer precios ms baratos.  El sitio web www.goodrx.com tiene cupones para medicamentos de Airline pilot. Los precios aqu no tienen en cuenta lo que podra costar con la ayuda del seguro (puede ser ms barato con su seguro), pero el sitio web puede darle el precio si no utiliz Research scientist (physical sciences).  - Puede imprimir el cupn correspondiente y llevarlo con su receta a la farmacia.  - Tambin puede pasar por nuestra oficina durante el horario de atencin regular y Charity fundraiser una tarjeta de cupones de GoodRx.  - Si necesita que su receta se enve electrnicamente a una farmacia diferente, informe a nuestra oficina a travs de MyChart de [place] o por telfono llamando al [phone] y presione la opcin 4.

## 2022-01-02 ENCOUNTER — Ambulatory Visit: Payer: Medicare Other | Admitting: Family Medicine

## 2022-01-13 ENCOUNTER — Other Ambulatory Visit: Payer: Self-pay | Admitting: Family Medicine

## 2022-01-19 DIAGNOSIS — M47816 Spondylosis without myelopathy or radiculopathy, lumbar region: Secondary | ICD-10-CM | POA: Diagnosis not present

## 2022-01-21 ENCOUNTER — Encounter: Payer: Self-pay | Admitting: Family Medicine

## 2022-01-21 ENCOUNTER — Ambulatory Visit (INDEPENDENT_AMBULATORY_CARE_PROVIDER_SITE_OTHER): Payer: Medicare Other | Admitting: Family Medicine

## 2022-01-21 VITALS — BP 116/70 | HR 81 | Temp 98.4°F | Ht 62.0 in | Wt 175.0 lb

## 2022-01-21 DIAGNOSIS — E785 Hyperlipidemia, unspecified: Secondary | ICD-10-CM | POA: Diagnosis not present

## 2022-01-21 DIAGNOSIS — K146 Glossodynia: Secondary | ICD-10-CM

## 2022-01-21 DIAGNOSIS — Z23 Encounter for immunization: Secondary | ICD-10-CM

## 2022-01-21 DIAGNOSIS — I10 Essential (primary) hypertension: Secondary | ICD-10-CM

## 2022-01-21 DIAGNOSIS — R7303 Prediabetes: Secondary | ICD-10-CM | POA: Diagnosis not present

## 2022-01-21 NOTE — Assessment & Plan Note (Signed)
Check lipid panel.  Continue Lipitor 40 mg daily.

## 2022-01-21 NOTE — Assessment & Plan Note (Signed)
Continue clonazepam 0.5 mg twice daily as needed burning mouth syndrome

## 2022-01-21 NOTE — Assessment & Plan Note (Signed)
Check A1c. 

## 2022-01-21 NOTE — Assessment & Plan Note (Signed)
Well-controlled.  She will continue amlodipine 5 mg daily, carvedilol 3.125 mg twice daily, and losartan 50 mg daily.

## 2022-01-21 NOTE — Progress Notes (Signed)
Tommi Rumps, MD Phone: [phone]  Nicole Washington is a 71 y.o. female who presents today for follow-up.  Hypertension: She is not checking her blood pressure at home.  She is taking amlodipine, carvedilol, and losartan.  No chest pain, shortness breath, or edema.  Hyperlipidemia: She is taking Lipitor.  Burning mouth syndrome: Patient notes the clonazepam helps with this.  She sporadically uses it.  If she has not taken it in a while and she takes it she will have a little drowsiness.  She knows not to drive if she is taking the clonazepam and gets drowsy.  She does report having some injections in her back this week that have been beneficial.  Social History   Tobacco Use  Smoking Status Former   Types: Cigarettes   Quit date: 12/20/1989   Years since quitting: 32.1  Smokeless Tobacco Never    Current Outpatient Medications on File Prior to Visit  Medication Sig Dispense Refill   amitriptyline (ELAVIL) 10 MG tablet amitriptyline 10 mg tablet  Take 1 tablet twice a day by oral route for 90 days.     amLODipine (NORVASC) 5 MG tablet TAKE 1 TABLET BY MOUTH EVERY DAY 90 tablet 1   atorvastatin (LIPITOR) 40 MG tablet TAKE 1 TABLET BY MOUTH EVERY DAY 90 tablet 1   carvedilol (COREG) 3.125 MG tablet TAKE 1 TABLET BY MOUTH TWICE A DAY WITH FOOD 180 tablet 1   clonazePAM (KLONOPIN) 0.5 MG tablet TAKE TWO TABLETS BY MOUTH DAILY AS NEEDED FOR ANXIETY 60 tablet 0   DULoxetine (CYMBALTA) 60 MG capsule Take 60 mg by mouth 2 (two) times daily.     losartan (COZAAR) 50 MG tablet TAKE 1 TABLET BY MOUTH EVERY DAY 90 tablet 3   metroNIDAZOLE (METROCREAM) 0.75 % cream Apply topically 2 (two) times daily. 45 g 2   omeprazole (PRILOSEC) 20 MG capsule TAKE 1 CAPSULE BY MOUTH EVERY DAY 90 capsule 1   pregabalin (LYRICA) 150 MG capsule Take 150 mg by mouth 2 (two) times daily.     sertraline (ZOLOFT) 100 MG tablet Take 100 mg by mouth daily.     traZODone (DESYREL) 100 MG tablet Take 100 mg by mouth  at bedtime.     No current facility-administered medications on file prior to visit.     ROS see history of present illness  Objective  Physical Exam Vitals:   01/21/22 1608  BP: 116/70  Pulse: 81  Temp: 98.4 F (36.9 C)  SpO2: 98%    BP Readings from Last 3 Encounters:  01/21/22 116/70  07/02/21 90/60  06/23/21 130/80   Wt Readings from Last 3 Encounters:  01/21/22 175 lb (79.4 kg)  07/02/21 172 lb 12.8 oz (78.4 kg)  06/23/21 173 lb 3.2 oz (78.6 kg)    Physical Exam Constitutional:      General: She is not in acute distress.    Appearance: She is not diaphoretic.  Cardiovascular:     Rate and Rhythm: Normal rate and regular rhythm.     Heart sounds: Normal heart sounds.  Pulmonary:     Effort: Pulmonary effort is normal.     Breath sounds: Normal breath sounds.  Skin:    General: Skin is warm and dry.  Neurological:     Mental Status: She is alert.      Assessment/Plan: Please see individual problem list.  Problem List Items Addressed This Visit     Burning mouth syndrome (Chronic)    Continue clonazepam 0.5 mg  twice daily as needed burning mouth syndrome      Hyperlipidemia (Chronic)    Check lipid panel.  Continue Lipitor 40 mg daily.      Relevant Orders   Comp Met (CMET)   Lipid panel   Hypertension - Primary (Chronic)    Well-controlled.  She will continue amlodipine 5 mg daily, carvedilol 3.125 mg twice daily, and losartan 50 mg daily.      Relevant Orders   Comp Met (CMET)   Lipid panel   Prediabetes (Chronic)    Check A1c.      Relevant Orders   HgB A1c   Other Visit Diagnoses     Need for immunization against influenza       Relevant Orders   Flu Vaccine QUAD High Dose(Fluad) (Completed)        Health Maintenance: I encouraged the patient to get the RSV vaccine and updated COVID-vaccine at the pharmacy.  She was given her flu vaccine today.  We will check with her pharmacy regarding the dates of her Shingrix  vaccine.  Return in about 6 months (around 07/23/2022).   Tommi Rumps, MD [place]

## 2022-01-22 LAB — COMPREHENSIVE METABOLIC PANEL
ALT: 17 U/L (ref 0–35)
AST: 15 U/L (ref 0–37)
Albumin: 4.3 g/dL (ref 3.5–5.2)
Alkaline Phosphatase: 110 U/L (ref 39–117)
BUN: 18 mg/dL (ref 6–23)
CO2: 29 mEq/L (ref 19–32)
Calcium: 9.2 mg/dL (ref 8.4–10.5)
Chloride: 99 mEq/L (ref 96–112)
Creatinine, Ser: 0.87 mg/dL (ref 0.40–1.20)
GFR: 66.56 mL/min (ref >60.00)
Glucose, Bld: 79 mg/dL (ref 70–99)
Potassium: 3.8 mEq/L (ref 3.5–5.1)
Sodium: 136 mEq/L (ref 135–145)
Total Bilirubin: 0.4 mg/dL (ref 0.2–1.2)
Total Protein: 6.8 g/dL (ref 6.0–8.3)

## 2022-01-22 LAB — LIPID PANEL
Cholesterol: 170 mg/dL (ref 0–200)
HDL: 63 mg/dL (ref >39.00)
LDL Cholesterol: 79 mg/dL (ref 0–99)
NonHDL: 107.11
Total CHOL/HDL Ratio: 3
Triglycerides: 143 mg/dL (ref 0.0–149.0)
VLDL: 28.6 mg/dL (ref 0.0–40.0)

## 2022-01-22 LAB — HEMOGLOBIN A1C: Hgb A1c MFr Bld: 6.4 % (ref 4.6–6.5)

## 2022-01-26 DIAGNOSIS — H2512 Age-related nuclear cataract, left eye: Secondary | ICD-10-CM | POA: Diagnosis not present

## 2022-02-05 ENCOUNTER — Encounter: Payer: Self-pay | Admitting: Ophthalmology

## 2022-02-18 DIAGNOSIS — M47816 Spondylosis without myelopathy or radiculopathy, lumbar region: Secondary | ICD-10-CM | POA: Diagnosis not present

## 2022-02-18 DIAGNOSIS — M25511 Pain in right shoulder: Secondary | ICD-10-CM | POA: Diagnosis not present

## 2022-02-19 NOTE — Discharge Instructions (Signed)

## 2022-02-23 ENCOUNTER — Ambulatory Visit
Admission: RE | Admit: 2022-02-23 | Discharge: 2022-02-23 | Disposition: A | Discharge status: Home / Self Care | Payer: Medicare Other | Attending: Ophthalmology | Admitting: Ophthalmology

## 2022-02-23 ENCOUNTER — Other Ambulatory Visit: Payer: Self-pay | Admitting: Family Medicine

## 2022-02-23 ENCOUNTER — Ambulatory Visit: Payer: Medicare Other | Admitting: Anesthesiology

## 2022-02-23 ENCOUNTER — Encounter: Payer: Self-pay | Admitting: Ophthalmology

## 2022-02-23 ENCOUNTER — Encounter
Admission: RE | Disposition: A | Discharge status: Home / Self Care | Payer: Self-pay | Source: Home / Self Care | Attending: Ophthalmology

## 2022-02-23 ENCOUNTER — Other Ambulatory Visit: Payer: Self-pay

## 2022-02-23 DIAGNOSIS — Z87891 Personal history of nicotine dependence: Secondary | ICD-10-CM | POA: Insufficient documentation

## 2022-02-23 DIAGNOSIS — E039 Hypothyroidism, unspecified: Secondary | ICD-10-CM | POA: Insufficient documentation

## 2022-02-23 DIAGNOSIS — G4730 Sleep apnea, unspecified: Secondary | ICD-10-CM | POA: Diagnosis not present

## 2022-02-23 DIAGNOSIS — F418 Other specified anxiety disorders: Secondary | ICD-10-CM | POA: Insufficient documentation

## 2022-02-23 DIAGNOSIS — E1136 Type 2 diabetes mellitus with diabetic cataract: Secondary | ICD-10-CM | POA: Diagnosis not present

## 2022-02-23 DIAGNOSIS — I10 Essential (primary) hypertension: Secondary | ICD-10-CM | POA: Insufficient documentation

## 2022-02-23 DIAGNOSIS — I2510 Atherosclerotic heart disease of native coronary artery without angina pectoris: Secondary | ICD-10-CM | POA: Insufficient documentation

## 2022-02-23 DIAGNOSIS — H2512 Age-related nuclear cataract, left eye: Secondary | ICD-10-CM | POA: Diagnosis not present

## 2022-02-23 DIAGNOSIS — K219 Gastro-esophageal reflux disease without esophagitis: Secondary | ICD-10-CM | POA: Insufficient documentation

## 2022-02-23 HISTORY — DX: Sleep apnea, unspecified: G47.30

## 2022-02-23 HISTORY — PX: CATARACT EXTRACTION W/PHACO: SHX586

## 2022-02-23 SURGERY — PHACOEMULSIFICATION, CATARACT, WITH IOL INSERTION
Anesthesia: Monitor Anesthesia Care | Laterality: Left

## 2022-02-23 MED ORDER — SIGHTPATH DOSE#1 BSS IO SOLN
INTRAOCULAR | Status: DC | PRN
  Administered 2022-02-23: 15 mL

## 2022-02-23 MED ORDER — ONDANSETRON HCL 4 MG/2ML IJ SOLN: 4.0000 mg | Freq: Once | INTRAMUSCULAR | Status: DC | PRN

## 2022-02-23 MED ORDER — SIGHTPATH DOSE#1 NA HYALUR & NA CHOND-NA HYALUR IO KIT
PACK | INTRAOCULAR | Status: DC | PRN
  Administered 2022-02-23: 1 via OPHTHALMIC

## 2022-02-23 MED ORDER — FENTANYL CITRATE (PF) 100 MCG/2ML IJ SOLN
INTRAMUSCULAR | Status: DC | PRN
  Administered 2022-02-23: 50 ug via INTRAVENOUS
  Administered 2022-02-23 (×2): 25 ug via INTRAVENOUS

## 2022-02-23 MED ORDER — SIGHTPATH DOSE#1 BSS IO SOLN
INTRAOCULAR | Status: DC | PRN
  Administered 2022-02-23: 83 mL via OPHTHALMIC

## 2022-02-23 MED ORDER — LACTATED RINGERS IV SOLN: INTRAVENOUS | Status: DC

## 2022-02-23 MED ORDER — FENTANYL CITRATE PF 50 MCG/ML IJ SOSY: 25.0000 ug | PREFILLED_SYRINGE | INTRAMUSCULAR | Status: DC | PRN

## 2022-02-23 MED ORDER — MOXIFLOXACIN HCL 0.5 % OP SOLN
OPHTHALMIC | Status: DC | PRN
  Administered 2022-02-23: .2 mL via OPHTHALMIC

## 2022-02-23 MED ORDER — MIDAZOLAM HCL 2 MG/2ML IJ SOLN
INTRAMUSCULAR | Status: DC | PRN
  Administered 2022-02-23: 2 mg via INTRAVENOUS

## 2022-02-23 MED ORDER — TETRACAINE HCL 0.5 % OP SOLN
1.0000 [drp] | OPHTHALMIC | Status: DC | PRN
  Administered 2022-02-23 (×3): 1 [drp] via OPHTHALMIC

## 2022-02-23 MED ORDER — ARMC OPHTHALMIC DILATING DROPS
1.0000 | OPHTHALMIC | Status: DC | PRN
  Administered 2022-02-23 (×3): 1 via OPHTHALMIC

## 2022-02-23 MED ORDER — LIDOCAINE HCL (PF) 2 % IJ SOLN
INTRAOCULAR | Status: DC | PRN
  Administered 2022-02-23: 1 mL via INTRAOCULAR

## 2022-02-23 SURGICAL SUPPLY — 13 items
CATARACT SUITE SIGHTPATH (MISCELLANEOUS) ×1 IMPLANT
DISSECTOR HYDRO NUCLEUS 50X22 (MISCELLANEOUS) ×1 IMPLANT
FEE CATARACT SUITE SIGHTPATH (MISCELLANEOUS) ×1 IMPLANT
GLOVE SURG GAMMEX PI TX LF 7.5 (GLOVE) ×1 IMPLANT
GLOVE SURG SYN 8.5  E (GLOVE) ×1
GLOVE SURG SYN 8.5 E (GLOVE) ×1 IMPLANT
GLOVE SURG SYN 8.5 PF PI (GLOVE) ×1 IMPLANT
LENS IOL TECNIS EYHANCE 21.5 (Intraocular Lens) IMPLANT
NDL FILTER BLUNT 18X1 1/2 (NEEDLE) ×1 IMPLANT
NEEDLE FILTER BLUNT 18X1 1/2 (NEEDLE) ×1 IMPLANT
SYR 3ML LL SCALE MARK (SYRINGE) ×1 IMPLANT
SYR 5ML LL (SYRINGE) ×1 IMPLANT
WATER STERILE IRR 250ML POUR (IV SOLUTION) ×1 IMPLANT

## 2022-02-23 NOTE — Transfer of Care (Signed)
Immediate Anesthesia Transfer of Care Note  Patient: Nicole Washington  Procedure(s) Performed: CATARACT EXTRACTION PHACO AND INTRAOCULAR LENS PLACEMENT (IOC) LEFT  3.78  00:31.8 (Left)  Patient Location: PACU  Anesthesia Type: MAC  Level of Consciousness: awake, alert  and patient cooperative  Airway and Oxygen Therapy: Patient Spontanous Breathing and Patient connected to supplemental oxygen  Post-op Assessment: Post-op Vital signs reviewed, Patient's Cardiovascular Status Stable, Respiratory Function Stable, Patent Airway and No signs of Nausea or vomiting  Post-op Vital Signs: Reviewed and stable  Complications: No notable events documented.

## 2022-02-23 NOTE — H&P (Signed)
[facility]   Primary Care Physician:  Leone Haven, MD Ophthalmologist: Dr. Benay Pillow  Pre-Procedure History & Physical: HPI:  Nicole Washington is a 72 y.o. female here for cataract surgery.   Past Medical History:  Diagnosis Date   Actinic keratosis    Basal cell carcinoma    Legs, L forehead, txted in past in [place]   Burning mouth syndrome    CAD (coronary artery disease)    Cancer ([place])    basal and squamous cell   Depression    GERD (gastroesophageal reflux disease)    Hyperlipidemia    Hypertension    Sleep apnea    CPAP   Squamous cell carcinoma of skin    R hand dorsum, L forehead, txted in [place]   Thyroid disease     Past Surgical History:  Procedure Laterality Date   BREAST BIOPSY Right yrs ago   benign, marker placed   CESAREAN SECTION      Prior to Admission medications   Medication Sig Start Date End Date Taking? Authorizing Provider  amitriptyline (ELAVIL) 10 MG tablet amitriptyline 10 mg tablet  Take 1 tablet twice a day by oral route for 90 days.   Yes [provider]  amLODipine (NORVASC) 5 MG tablet TAKE 1 TABLET BY MOUTH EVERY DAY 12/04/21  Yes Leone Haven, MD  atorvastatin (LIPITOR) 40 MG tablet TAKE 1 TABLET BY MOUTH EVERY DAY 12/04/21  Yes Leone Haven, MD  carvedilol (COREG) 3.125 MG tablet TAKE 1 TABLET BY MOUTH TWICE A DAY WITH FOOD 01/14/22  Yes Leone Haven, MD  clonazePAM (KLONOPIN) 0.5 MG tablet TAKE TWO TABLETS BY MOUTH DAILY AS NEEDED FOR ANXIETY 12/08/21  Yes Leone Haven, MD  DULoxetine (CYMBALTA) 60 MG capsule Take 60 mg by mouth 2 (two) times daily.   Yes [provider]  losartan (COZAAR) 50 MG tablet TAKE 1 TABLET BY MOUTH EVERY DAY 05/30/21  Yes Leone Haven, MD  omeprazole (PRILOSEC) 20 MG capsule TAKE 1 CAPSULE BY MOUTH EVERY DAY 01/13/22  Yes Leone Haven, MD  pregabalin (LYRICA) 150 MG capsule Take 150 mg by mouth 2 (two) times daily. 07/27/19  Yes [provider]  sertraline (ZOLOFT) 100 MG tablet Take 100 mg by mouth daily. 04/20/18  Yes [provider]  traZODone (DESYREL) 100 MG tablet Take 100 mg by mouth at bedtime.   Yes [provider]    Allergies as of 12/15/2021 - Review Complete 10/28/2021  Allergen Reaction Noted   Codeine Itching 10/27/2016   Tylenol [acetaminophen] Itching 10/27/2016   Pneumovax 23 [pneumococcal vac polyvalent] Swelling 09/19/2019    Family History  Problem Relation Age of Onset   Cerebral aneurysm Mother 41   COPD Father    Cancer Brother        ? colon   Lupus Other    Heart Problems Paternal Grandfather    Breast cancer Neg Hx     Social History   Socioeconomic History   Marital status: Married    Spouse name: Not on file   Number of children: Not on file   Years of education: Not on file   Highest education level: Not on file  Occupational History   Not on file  Tobacco Use   Smoking status: Former    Types: Cigarettes    Quit date: 12/20/1989    Years since quitting: 32.2   Smokeless tobacco: Never  Vaping Use   Vaping Use:  Never used  Substance and Sexual Activity   Alcohol use: Yes    Comment: 4 times a year   Drug use: No   Sexual activity: Not on file  Other Topics Concern   Not on file  Social History Narrative   Not on file   Social Determinants of Health   Financial Resource Strain: Low Risk  (12/13/2020)   Overall Financial Resource Strain (CARDIA)    Difficulty of Paying Living Expenses: Not hard at all  Food Insecurity: No Food Insecurity (07/17/2020)   Hunger Vital Sign    Worried About Running Out of Food in the Last Year: Never true    [place] in the Last Year: Never true  Transportation Needs: No Transportation Needs (07/17/2020)   PRAPARE - Hydrologist (Medical): No    Lack of Transportation (Non-Medical): No  Physical Activity: Unknown (07/17/2020)   Exercise Vital Sign    Days of Exercise per Week:  0 days    Minutes of Exercise per Session: Not on file  Stress: No Stress Concern Present (07/17/2020)   [place]    Feeling of Stress : Not at all  Social Connections: Not on file  Intimate Partner Violence: Not At Risk (07/17/2020)   Humiliation, Afraid, Rape, and Kick questionnaire    Fear of Current or Ex-Partner: No    Emotionally Abused: No    Physically Abused: No    Sexually Abused: No    Review of Systems: See HPI, otherwise negative ROS  Physical Exam: BP 139/85   Pulse 83   Temp (!) 97.1 F (36.2 C) (Temporal)   Resp 18   Ht '5\' 2"'$  (1.575 m)   Wt 80.7 kg   SpO2 93%   BMI 32.56 kg/m  General:   Alert, cooperative in NAD Head:  Normocephalic and atraumatic. Respiratory:  Normal work of breathing. Cardiovascular:  RRR  Impression/Plan: Nicole Washington is here for cataract surgery.  Risks, benefits, limitations, and alternatives regarding cataract surgery have been reviewed with the patient.  Questions have been answered.  All parties agreeable.   Benay Pillow, MD  02/23/2022, 7:51 AM

## 2022-02-23 NOTE — Anesthesia Postprocedure Evaluation (Signed)
Anesthesia Post Note  Patient: Nicole Washington  Procedure(s) Performed: CATARACT EXTRACTION PHACO AND INTRAOCULAR LENS PLACEMENT (IOC) LEFT  3.78  00:31.8 (Left)  Patient location during evaluation: PACU Anesthesia Type: MAC Level of consciousness: awake and alert Pain management: pain level controlled Vital Signs Assessment: post-procedure vital signs reviewed and stable Respiratory status: spontaneous breathing, nonlabored ventilation, respiratory function stable and patient connected to nasal cannula oxygen Cardiovascular status: blood pressure returned to baseline and stable Postop Assessment: no apparent nausea or vomiting Anesthetic complications: no   No notable events documented.   Last Vitals:  Vitals:   02/23/22 0822 02/23/22 0825  BP: 118/77 120/72  Pulse: 85 85  Resp: 14 13  Temp: 36.7 C 36.7 C  SpO2: 94% 93%    Last Pain:  Vitals:   02/23/22 0825  TempSrc:   PainSc: 0-No pain                 Molli Barrows

## 2022-02-23 NOTE — Anesthesia Preprocedure Evaluation (Signed)
Anesthesia Evaluation  Patient identified by MRN, date of birth, ID band Patient awake    Reviewed: Allergy & Precautions, H&P , NPO status , Patient's Chart, lab work & pertinent test results, reviewed documented beta blocker date and time   Airway Mallampati: II  TM Distance: >3 FB Neck ROM: full    Dental no notable dental hx. (+) Teeth Intact   Pulmonary sleep apnea and Continuous Positive Airway Pressure Ventilation , former smoker   Pulmonary exam normal breath sounds clear to auscultation       Cardiovascular Exercise Tolerance: Good hypertension, On Medications + CAD   Rhythm:regular Rate:Normal     Neuro/Psych   Anxiety Depression     Neuromuscular disease  negative psych ROS   GI/Hepatic Neg liver ROS,GERD  Medicated,,  Endo/Other  diabetesHypothyroidism    Renal/GU      Musculoskeletal   Abdominal   Peds  Hematology negative hematology ROS (+)   Anesthesia Other Findings   Reproductive/Obstetrics negative OB ROS                             Anesthesia Physical Anesthesia Plan  ASA: 3  Anesthesia Plan: MAC   Post-op Pain Management:    Induction:   PONV Risk Score and Plan:   Airway Management Planned:   Additional Equipment:   Intra-op Plan:   Post-operative Plan:   Informed Consent: I have reviewed the patients History and Physical, chart, labs and discussed the procedure including the risks, benefits and alternatives for the proposed anesthesia with the patient or authorized representative who has indicated his/her understanding and acceptance.       Plan Discussed with: CRNA  Anesthesia Plan Comments:        Anesthesia Quick Evaluation

## 2022-02-23 NOTE — Op Note (Signed)
OPERATIVE NOTE  Nicole Washington [id number] 02/23/2022   PREOPERATIVE DIAGNOSIS:  Nuclear sclerotic cataract left eye.  H25.12   POSTOPERATIVE DIAGNOSIS:    Nuclear sclerotic cataract left eye.     PROCEDURE:  Phacoemusification with posterior chamber intraocular lens placement of the left eye   LENS:   Implant Name Type Inv. Item Serial No. Manufacturer Lot No. LRB No. Used Action  LENS IOL TECNIS EYHANCE 21.5 - [id number] Intraocular Lens LENS IOL TECNIS EYHANCE 21.5 [id number] [employer]  Left 1 Implanted      Procedure(s) with comments: CATARACT EXTRACTION PHACO AND INTRAOCULAR LENS PLACEMENT (IOC) LEFT  3.78  00:31.8 (Left) - Sleep apnea  DIB00 +21.5   ULTRASOUND TIME: 0 minutes 31 seconds.  CDE 3.78   SURGEON:  Benay Pillow, MD, MPH   ANESTHESIA:  Topical with tetracaine drops augmented with 1% preservative-free intracameral lidocaine.  ESTIMATED BLOOD LOSS: <1 mL   COMPLICATIONS:  None.   DESCRIPTION OF PROCEDURE:  The patient was identified in the holding room and transported to the operating room and placed in the supine position under the operating microscope.  The left eye was identified as the operative eye and it was prepped and draped in the usual sterile ophthalmic fashion.   A 1.0 millimeter clear-corneal paracentesis was made at the 5:00 position. 0.5 ml of preservative-free 1% lidocaine with epinephrine was injected into the anterior chamber.  The anterior chamber was filled with viscoelastic.  A 2.4 millimeter keratome was used to make a near-clear corneal incision at the 2:00 position.  A curvilinear capsulorrhexis was made with a cystotome and capsulorrhexis forceps.  Balanced salt solution was used to hydrodissect and hydrodelineate the nucleus.   Phacoemulsification was then used in stop and chop fashion to remove the lens nucleus and epinucleus.  The remaining cortex was then removed using the irrigation and aspiration handpiece. Viscoelastic was then placed into  the capsular bag to distend it for lens placement.  A lens was then injected into the capsular bag.  The remaining viscoelastic was aspirated.   Wounds were hydrated with balanced salt solution.  The anterior chamber was inflated to a physiologic pressure with balanced salt solution.  Intracameral vigamox 0.1 mL undiltued was injected into the eye and a drop placed onto the ocular surface.  No wound leaks were noted.  The patient was taken to the recovery room in stable condition without complications of anesthesia or surgery  Benay Pillow 02/23/2022, 8:21 AM

## 2022-02-24 ENCOUNTER — Encounter: Payer: Self-pay | Admitting: Ophthalmology

## 2022-02-24 ENCOUNTER — Other Ambulatory Visit: Payer: Self-pay

## 2022-02-24 NOTE — Anesthesia Preprocedure Evaluation (Addendum)
Anesthesia Evaluation  Patient identified by MRN, date of birth, ID band Patient awake    Reviewed: Allergy & Precautions, NPO status , Patient's Chart, lab work & pertinent test results  History of Anesthesia Complications Negative for: history of anesthetic complications  Airway Mallampati: III   Neck ROM: Full    Dental no notable dental hx.    Pulmonary sleep apnea and Continuous Positive Airway Pressure Ventilation , former smoker (quit 1991)   Pulmonary exam normal breath sounds clear to auscultation       Cardiovascular hypertension, + CAD  Normal cardiovascular exam Rhythm:Regular Rate:Normal     Neuro/Psych  PSYCHIATRIC DISORDERS (PTSD) Anxiety Depression    negative neurological ROS     GI/Hepatic ,GERD  ,,  Endo/Other  Hypothyroidism  Obesity; prediabetes  Renal/GU negative Renal ROS     Musculoskeletal  (+)  Fibromyalgia -  Abdominal   Peds  Hematology negative hematology ROS (+)   Anesthesia Other Findings   Reproductive/Obstetrics                             Anesthesia Physical Anesthesia Plan  ASA: 2  Anesthesia Plan: MAC   Post-op Pain Management:    Induction: Intravenous  PONV Risk Score and Plan: 2 and Treatment may vary due to age or medical condition, Midazolam and TIVA  Airway Management Planned: Natural Airway and Nasal Cannula  Additional Equipment:   Intra-op Plan:   Post-operative Plan:   Informed Consent: I have reviewed the patients History and Physical, chart, labs and discussed the procedure including the risks, benefits and alternatives for the proposed anesthesia with the patient or authorized representative who has indicated his/her understanding and acceptance.     Dental advisory given  Plan Discussed with: CRNA  Anesthesia Plan Comments: (LMA/GETA backup discussed.  Patient consented for risks of anesthesia including but not limited  to:  - adverse reactions to medications - damage to eyes, teeth, lips or other oral mucosa - nerve damage due to positioning  - sore throat or hoarseness - damage to heart, brain, nerves, lungs, other parts of body or loss of life  Informed patient about role of CRNA in peri- and intra-operative care.  Patient voiced understanding.)        Anesthesia Quick Evaluation

## 2022-03-06 NOTE — Discharge Instructions (Signed)
   Cataract Surgery, Care After ? ?This sheet gives you information about how to care for yourself after your surgery.  Your ophthalmologist may also give you more specific instructions.  If you have problems or questions, contact your doctor at [facility], [phone]. ? ?What can I expect after the surgery? ?It is common to have: ?Itching ?Foreign body sensation (feels like a grain of sand in the eye) ?Watery discharge (excess tearing) ?Sensitivity to light and touch ?Bruising in or around the eye ?Mild blurred vision ? ?Follow these instructions at home: ?Do not touch or rub your eyes. ?You may be told to wear a protective shield or sunglasses to protect your eyes. ?Do not put a contact lens in the operative eye unless your doctor approves. ?Keep the lids and face clean and dry. ?Do not allow water to hit you directly in the face while showering. ?Keep soap and shampoo out of your eyes. ?Do not use eye makeup for 1 week. ? ?Check your eye every day for signs of infection.  Watch for: ?Redness, swelling, or pain. ?Fluid, blood or pus. ?Worsening vision. ?Worsening sensitivity to light or touch. ? ?Activity: ?During the first day, avoid bending over and reading.  You may resume reading and bending the next day. ?Do not drive or use heavy machinery for at least 24 hours. ?Avoid strenuous activities for 1 week.  Activities such as walking, treadmill, exercise bike, and climbing stairs are okay. ?Do not lift heavy (>20 pound) objects for 1 week. ?Do not do yardwork, gardening, or dirty housework (mopping, cleaning bathrooms, vacuuming, etc.) for 1 week. ?Do not swim or use a hot tub for 2 weeks. ?Ask your doctor when you can return to work. ? ?General Instructions: ?Take or apply prescription and over-the-counter medicines as directed by your doctor, including eyedrops and ointments. ?Resume medications discontinued prior to surgery, unless told otherwise by your doctor. ?Keep all follow up appointments as  scheduled. ? ?Contact a health care provider if: ?You have increased bruising around your eye. ?You have pain that is not helped with medication. ?You have a fever. ?You have fluid, pus, or blood coming from your eye or incision. ?Your sensitivity to light gets worse. ?You have spots (floaters) of flashing lights in your vision. ?You have nausea or vomiting. ? ?Go to the nearest emergency room or call 911 if: ?You have sudden loss of vision. ?You have severe, worsening eye pain. ? ?

## 2022-03-09 ENCOUNTER — Other Ambulatory Visit: Payer: Self-pay

## 2022-03-09 ENCOUNTER — Encounter
Admission: RE | Disposition: A | Discharge status: Home / Self Care | Payer: Self-pay | Source: Home / Self Care | Attending: Ophthalmology

## 2022-03-09 ENCOUNTER — Ambulatory Visit: Payer: Medicare Other | Admitting: Anesthesiology

## 2022-03-09 ENCOUNTER — Encounter: Payer: Self-pay | Admitting: Ophthalmology

## 2022-03-09 ENCOUNTER — Ambulatory Visit
Admission: RE | Admit: 2022-03-09 | Discharge: 2022-03-09 | Disposition: A | Discharge status: Home / Self Care | Payer: Medicare Other | Attending: Ophthalmology | Admitting: Ophthalmology

## 2022-03-09 DIAGNOSIS — Z87891 Personal history of nicotine dependence: Secondary | ICD-10-CM | POA: Diagnosis not present

## 2022-03-09 DIAGNOSIS — I2510 Atherosclerotic heart disease of native coronary artery without angina pectoris: Secondary | ICD-10-CM | POA: Diagnosis not present

## 2022-03-09 DIAGNOSIS — E039 Hypothyroidism, unspecified: Secondary | ICD-10-CM | POA: Diagnosis not present

## 2022-03-09 DIAGNOSIS — M797 Fibromyalgia: Secondary | ICD-10-CM | POA: Diagnosis not present

## 2022-03-09 DIAGNOSIS — H2511 Age-related nuclear cataract, right eye: Secondary | ICD-10-CM | POA: Insufficient documentation

## 2022-03-09 DIAGNOSIS — I10 Essential (primary) hypertension: Secondary | ICD-10-CM | POA: Diagnosis not present

## 2022-03-09 DIAGNOSIS — K219 Gastro-esophageal reflux disease without esophagitis: Secondary | ICD-10-CM | POA: Diagnosis not present

## 2022-03-09 DIAGNOSIS — G4730 Sleep apnea, unspecified: Secondary | ICD-10-CM | POA: Insufficient documentation

## 2022-03-09 DIAGNOSIS — R7303 Prediabetes: Secondary | ICD-10-CM

## 2022-03-09 HISTORY — PX: CATARACT EXTRACTION W/PHACO: SHX586

## 2022-03-09 SURGERY — PHACOEMULSIFICATION, CATARACT, WITH IOL INSERTION
Anesthesia: Monitor Anesthesia Care | Site: Eye | Laterality: Right

## 2022-03-09 MED ORDER — ARMC OPHTHALMIC DILATING DROPS
1.0000 | OPHTHALMIC | Status: DC | PRN
  Administered 2022-03-09 (×3): 1 via OPHTHALMIC

## 2022-03-09 MED ORDER — MIDAZOLAM HCL 2 MG/2ML IJ SOLN
INTRAMUSCULAR | Status: DC | PRN
  Administered 2022-03-09: 1 mg via INTRAVENOUS

## 2022-03-09 MED ORDER — ACETAMINOPHEN 160 MG/5ML PO SOLN: 325.0000 mg | ORAL | Status: DC | PRN

## 2022-03-09 MED ORDER — TETRACAINE HCL 0.5 % OP SOLN
1.0000 [drp] | OPHTHALMIC | Status: DC | PRN
  Administered 2022-03-09 (×3): 1 [drp] via OPHTHALMIC

## 2022-03-09 MED ORDER — FENTANYL CITRATE (PF) 100 MCG/2ML IJ SOLN
INTRAMUSCULAR | Status: DC | PRN
  Administered 2022-03-09: 50 ug via INTRAVENOUS

## 2022-03-09 MED ORDER — SIGHTPATH DOSE#1 NA HYALUR & NA CHOND-NA HYALUR IO KIT
PACK | INTRAOCULAR | Status: DC | PRN
  Administered 2022-03-09: 1 via OPHTHALMIC

## 2022-03-09 MED ORDER — SIGHTPATH DOSE#1 BSS IO SOLN
INTRAOCULAR | Status: DC | PRN
  Administered 2022-03-09: 94 mL via OPHTHALMIC

## 2022-03-09 MED ORDER — SIGHTPATH DOSE#1 BSS IO SOLN
INTRAOCULAR | Status: DC | PRN
  Administered 2022-03-09: 15 mL

## 2022-03-09 MED ORDER — ONDANSETRON HCL 4 MG/2ML IJ SOLN: 4.0000 mg | Freq: Once | INTRAMUSCULAR | Status: DC | PRN

## 2022-03-09 MED ORDER — LACTATED RINGERS IV SOLN: INTRAVENOUS | Status: DC

## 2022-03-09 MED ORDER — ACETAMINOPHEN 325 MG PO TABS: 650.0000 mg | ORAL_TABLET | Freq: Once | ORAL | Status: DC | PRN

## 2022-03-09 MED ORDER — LIDOCAINE HCL (PF) 2 % IJ SOLN
INTRAOCULAR | Status: DC | PRN
  Administered 2022-03-09: 1 mL via INTRAOCULAR

## 2022-03-09 MED ORDER — MOXIFLOXACIN HCL 0.5 % OP SOLN
OPHTHALMIC | Status: DC | PRN
  Administered 2022-03-09: .2 mL via OPHTHALMIC

## 2022-03-09 SURGICAL SUPPLY — 13 items
CATARACT SUITE SIGHTPATH (MISCELLANEOUS) ×1 IMPLANT
DISSECTOR HYDRO NUCLEUS 50X22 (MISCELLANEOUS) ×1 IMPLANT
FEE CATARACT SUITE SIGHTPATH (MISCELLANEOUS) ×1 IMPLANT
GLOVE SURG GAMMEX PI TX LF 7.5 (GLOVE) ×1 IMPLANT
GLOVE SURG SYN 8.5  E (GLOVE) ×1
GLOVE SURG SYN 8.5 E (GLOVE) ×1 IMPLANT
GLOVE SURG SYN 8.5 PF PI (GLOVE) ×1 IMPLANT
LENS IOL TECNIS EYHANCE 21.5 (Intraocular Lens) IMPLANT
NDL FILTER BLUNT 18X1 1/2 (NEEDLE) ×1 IMPLANT
NEEDLE FILTER BLUNT 18X1 1/2 (NEEDLE) ×1 IMPLANT
SYR 3ML LL SCALE MARK (SYRINGE) ×1 IMPLANT
SYR 5ML LL (SYRINGE) ×1 IMPLANT
WATER STERILE IRR 250ML POUR (IV SOLUTION) ×1 IMPLANT

## 2022-03-09 NOTE — Anesthesia Postprocedure Evaluation (Signed)
Anesthesia Post Note  Patient: Nicole Washington  Procedure(s) Performed: CATARACT EXTRACTION PHACO AND INTRAOCULAR LENS PLACEMENT (IOC) RIGHT  4.31  00:37.6 (Right: Eye)  Patient location during evaluation: PACU Anesthesia Type: MAC Level of consciousness: awake and alert, oriented and patient cooperative Pain management: pain level controlled Vital Signs Assessment: post-procedure vital signs reviewed and stable Respiratory status: spontaneous breathing, nonlabored ventilation and respiratory function stable Cardiovascular status: blood pressure returned to baseline and stable Postop Assessment: adequate PO intake Anesthetic complications: no   No notable events documented.   Last Vitals:  Vitals:   03/09/22 1320 03/09/22 1454  BP: 122/83 127/80  Pulse: 86 92  Resp: 18 14  Temp: (!) 36.4 C (!) 36.3 C  SpO2: 95% 94%    Last Pain:  Vitals:   03/09/22 1454  TempSrc:   PainSc: 0-No pain                 Darrin Nipper

## 2022-03-09 NOTE — Transfer of Care (Signed)
Immediate Anesthesia Transfer of Care Note  Patient: Nicole Washington  Procedure(s) Performed: CATARACT EXTRACTION PHACO AND INTRAOCULAR LENS PLACEMENT (IOC) RIGHT  4.31  00:37.6 (Right: Eye)  Patient Location: PACU  Anesthesia Type: MAC  Level of Consciousness: awake, alert  and patient cooperative  Airway and Oxygen Therapy: Patient Spontanous Breathing and Patient connected to supplemental oxygen  Post-op Assessment: Post-op Vital signs reviewed, Patient's Cardiovascular Status Stable, Respiratory Function Stable, Patent Airway and No signs of Nausea or vomiting  Post-op Vital Signs: Reviewed and stable  Complications: No notable events documented.

## 2022-03-09 NOTE — Op Note (Signed)
OPERATIVE NOTE  Nicole Washington [id number] 03/09/2022   PREOPERATIVE DIAGNOSIS:  Nuclear sclerotic cataract right eye.  H25.11   POSTOPERATIVE DIAGNOSIS:    Nuclear sclerotic cataract right eye.     PROCEDURE:  Phacoemusification with posterior chamber intraocular lens placement of the right eye   LENS:   Implant Name Type Inv. Item Serial No. Manufacturer Lot No. LRB No. Used Action  LENS IOL TECNIS EYHANCE 21.5 - [id number] Intraocular Lens LENS IOL TECNIS EYHANCE 21.5 [id number] [employer]  Right 1 Implanted       Procedure(s) with comments: CATARACT EXTRACTION PHACO AND INTRAOCULAR LENS PLACEMENT (IOC) RIGHT  4.31  00:37.6 (Right) - Sleep apnea  DIB00 +21.5   ULTRASOUND TIME: 0 minutes 37 seconds.  CDE 4.31   SURGEON:  Benay Pillow, MD, MPH  ANESTHESIOLOGIST: Anesthesiologist: Darrin Nipper, MD CRNA: Hilbert Odor, CRNA   ANESTHESIA:  Topical with tetracaine drops augmented with 1% preservative-free intracameral lidocaine.  ESTIMATED BLOOD LOSS: less than 1 mL.   COMPLICATIONS:  None.   DESCRIPTION OF PROCEDURE:  The patient was identified in the holding room and transported to the operating room and placed in the supine position under the operating microscope.  The right eye was identified as the operative eye and it was prepped and draped in the usual sterile ophthalmic fashion.   A 1.0 millimeter clear-corneal paracentesis was made at the 10:30 position. 0.5 ml of preservative-free 1% lidocaine with epinephrine was injected into the anterior chamber.  The anterior chamber was filled with viscoelastic.  A 2.4 millimeter keratome was used to make a near-clear corneal incision at the 8:00 position.  A curvilinear capsulorrhexis was made with a cystotome and capsulorrhexis forceps.  Balanced salt solution was used to hydrodissect and hydrodelineate the nucleus.   Phacoemulsification was then used in stop and chop fashion to remove the lens nucleus and epinucleus.  The  remaining cortex was then removed using the irrigation and aspiration handpiece. Viscoelastic was then placed into the capsular bag to distend it for lens placement.  A lens was then injected into the capsular bag.  The remaining viscoelastic was aspirated.   Wounds were hydrated with balanced salt solution.  The anterior chamber was inflated to a physiologic pressure with balanced salt solution.   Intracameral vigamox 0.1 mL undiluted was injected into the eye and a drop placed onto the ocular surface.  No wound leaks were noted.  The patient was taken to the recovery room in stable condition without complications of anesthesia or surgery  Benay Pillow 03/09/2022, 2:54 PM

## 2022-03-09 NOTE — H&P (Signed)
[facility]   Primary Care Physician:  Leone Haven, MD Ophthalmologist: Dr. Benay Pillow  Pre-Procedure History & Physical: HPI:  Nicole Washington is a 72 y.o. female here for cataract surgery.   Past Medical History:  Diagnosis Date   Actinic keratosis    Basal cell carcinoma    Legs, L forehead, txted in past in [place]   Burning mouth syndrome    CAD (coronary artery disease)    Cancer ([place])    basal and squamous cell   Depression    GERD (gastroesophageal reflux disease)    Hyperlipidemia    Hypertension    Sleep apnea    CPAP   Squamous cell carcinoma of skin    R hand dorsum, L forehead, txted in [place]   Thyroid disease     Past Surgical History:  Procedure Laterality Date   BREAST BIOPSY Right yrs ago   benign, marker placed   CATARACT EXTRACTION W/PHACO Left 02/23/2022   Procedure: CATARACT EXTRACTION PHACO AND INTRAOCULAR LENS PLACEMENT ([place]) LEFT  3.78  00:31.8;  Surgeon: Eulogio Bear, MD;  Location: [facility];  Service: Ophthalmology;  Laterality: Left;  Sleep apnea   CESAREAN SECTION      Prior to Admission medications   Medication Sig Start Date End Date Taking? Authorizing Provider  amitriptyline (ELAVIL) 10 MG tablet amitriptyline 10 mg tablet  Take 1 tablet twice a day by oral route for 90 days.   Yes [provider]  amLODipine (NORVASC) 5 MG tablet TAKE 1 TABLET BY MOUTH EVERY DAY 12/04/21  Yes Leone Haven, MD  atorvastatin (LIPITOR) 40 MG tablet TAKE 1 TABLET BY MOUTH EVERY DAY 12/04/21  Yes Leone Haven, MD  carvedilol (COREG) 3.125 MG tablet TAKE 1 TABLET BY MOUTH TWICE A DAY WITH FOOD 01/14/22  Yes Leone Haven, MD  clonazePAM (KLONOPIN) 0.5 MG tablet TAKE TWO TABLETS BY MOUTH DAILY AS NEEDED FOR ANXIETY 02/23/22  Yes Leone Haven, MD  losartan (COZAAR) 50 MG tablet TAKE 1 TABLET BY MOUTH EVERY DAY 05/30/21  Yes Leone Haven, MD  omeprazole (PRILOSEC) 20 MG capsule TAKE 1 CAPSULE BY  MOUTH EVERY DAY 01/13/22  Yes Leone Haven, MD  pregabalin (LYRICA) 150 MG capsule Take 150 mg by mouth 2 (two) times daily. 07/27/19  Yes [provider]  sertraline (ZOLOFT) 100 MG tablet Take 100 mg by mouth daily. 04/20/18  Yes [provider]  traZODone (DESYREL) 100 MG tablet Take 100 mg by mouth at bedtime.   Yes [provider]  DULoxetine (CYMBALTA) 60 MG capsule Take 60 mg by mouth 2 (two) times daily.    [provider]    Allergies as of 12/15/2021 - Review Complete 10/28/2021  Allergen Reaction Noted   Codeine Itching 10/27/2016   Tylenol [acetaminophen] Itching 10/27/2016   Pneumovax 23 [pneumococcal vac polyvalent] Swelling 09/19/2019    Family History  Problem Relation Age of Onset   Cerebral aneurysm Mother 31   COPD Father    Cancer Brother        ? colon   Lupus Other    Heart Problems Paternal Grandfather    Breast cancer Neg Hx     Social History   Socioeconomic History   Marital status: Married    Spouse name: Not on file   Number of children: Not on file   Years of education: Not on file   Highest education level: Not on file  Occupational History  Not on file  Tobacco Use   Smoking status: Former    Types: Cigarettes    Quit date: 12/20/1989    Years since quitting: 32.2   Smokeless tobacco: Never  Vaping Use   Vaping Use: Never used  Substance and Sexual Activity   Alcohol use: Yes    Comment: 4 times a year   Drug use: No   Sexual activity: Not on file  Other Topics Concern   Not on file  Social History Narrative   Not on file   Social Determinants of Health   Financial Resource Strain: Low Risk  (12/13/2020)   Overall Financial Resource Strain (CARDIA)    Difficulty of Paying Living Expenses: Not hard at all  Food Insecurity: No Food Insecurity (07/17/2020)   Hunger Vital Sign    Worried About Running Out of Food in the Last Year: Never true    [place] in the Last Year: Never true   Transportation Needs: No Transportation Needs (07/17/2020)   PRAPARE - Hydrologist (Medical): No    Lack of Transportation (Non-Medical): No  Physical Activity: Unknown (07/17/2020)   Exercise Vital Sign    Days of Exercise per Week: 0 days    Minutes of Exercise per Session: Not on file  Stress: No Stress Concern Present (07/17/2020)   [place]    Feeling of Stress : Not at all  Social Connections: Not on file  Intimate Partner Violence: Not At Risk (07/17/2020)   Humiliation, Afraid, Rape, and Kick questionnaire    Fear of Current or Ex-Partner: No    Emotionally Abused: No    Physically Abused: No    Sexually Abused: No    Review of Systems: See HPI, otherwise negative ROS  Physical Exam: BP 122/83   Pulse 86   Temp (!) 97.5 F (36.4 C) (Temporal)   Resp 18   Ht '5\' 2"'$  (1.575 m)   Wt 81.6 kg   SpO2 95%   BMI 32.92 kg/m  General:   Alert, cooperative in NAD Head:  Normocephalic and atraumatic. Respiratory:  Normal work of breathing. Cardiovascular:  RRR  Impression/Plan: Nicole Washington is here for cataract surgery.  Risks, benefits, limitations, and alternatives regarding cataract surgery have been reviewed with the patient.  Questions have been answered.  All parties agreeable.   Benay Pillow, MD  03/09/2022, 2:22 PM

## 2022-03-11 ENCOUNTER — Encounter: Payer: Self-pay | Admitting: Ophthalmology

## 2022-03-12 DIAGNOSIS — F411 Generalized anxiety disorder: Secondary | ICD-10-CM | POA: Diagnosis not present

## 2022-03-12 DIAGNOSIS — F4312 Post-traumatic stress disorder, chronic: Secondary | ICD-10-CM | POA: Diagnosis not present

## 2022-03-12 DIAGNOSIS — F39 Unspecified mood [affective] disorder: Secondary | ICD-10-CM | POA: Diagnosis not present

## 2022-03-12 DIAGNOSIS — F5105 Insomnia due to other mental disorder: Secondary | ICD-10-CM | POA: Diagnosis not present

## 2022-04-06 ENCOUNTER — Ambulatory Visit (INDEPENDENT_AMBULATORY_CARE_PROVIDER_SITE_OTHER): Payer: Medicare Other | Admitting: Dermatology

## 2022-04-06 VITALS — BP 122/79 | HR 87

## 2022-04-06 DIAGNOSIS — L814 Other melanin hyperpigmentation: Secondary | ICD-10-CM

## 2022-04-06 DIAGNOSIS — B009 Herpesviral infection, unspecified: Secondary | ICD-10-CM

## 2022-04-06 DIAGNOSIS — I781 Nevus, non-neoplastic: Secondary | ICD-10-CM

## 2022-04-06 MED ORDER — VALACYCLOVIR HCL 1 G PO TABS: 1000.0000 mg | ORAL_TABLET | ORAL | 6 refills | Status: AC

## 2022-04-06 NOTE — Patient Instructions (Signed)
Due to recent changes in healthcare laws, you may see results of your pathology and/or laboratory studies on MyChart before the doctors have had a chance to review them. We understand that in some cases there may be results that are confusing or concerning to you. Please understand that not all results are received at the same time and often the doctors may need to interpret multiple results in order to provide you with the best plan of care or course of treatment. Therefore, we ask that you please give us 2 business days to thoroughly review all your results before contacting the office for clarification. Should we see a critical lab result, you will be contacted sooner.   If You Need Anything After Your Visit  If you have any questions or concerns for your doctor, please call our main line at [phone] and press option 4 to reach your doctor's medical assistant. If no one answers, please leave a voicemail as directed and we will return your call as soon as possible. Messages left after 4 pm will be answered the following business day.   You may also send us a message via MyChart. We typically respond to MyChart messages within 1-2 business days.  For prescription refills, please ask your pharmacy to contact our office. Our fax number is [phone].  If you have an urgent issue when the clinic is closed that cannot wait until the next business day, you can page your doctor at the number below.    Please note that while we do our best to be available for urgent issues outside of office hours, we are not available 24/7.   If you have an urgent issue and are unable to reach us, you may choose to seek medical care at your doctor's office, retail clinic, urgent care center, or emergency room.  If you have a medical emergency, please immediately call 911 or go to the emergency department.  Pager Numbers  - Dr. Kowalski: [phone]  - Dr. Moye: [phone]  - Dr. Stewart:  [phone]  In the event of inclement weather, please call our main line at [phone] for an update on the status of any delays or closures.  Dermatology Medication Tips: Please keep the boxes that topical medications come in in order to help keep track of the instructions about where and how to use these. Pharmacies typically print the medication instructions only on the boxes and not directly on the medication tubes.   If your medication is too expensive, please contact our office at [phone] option 4 or send us a message through MyChart.   We are unable to tell what your co-pay for medications will be in advance as this is different depending on your insurance coverage. However, we may be able to find a substitute medication at lower cost or fill out paperwork to get insurance to cover a needed medication.   If a prior authorization is required to get your medication covered by your insurance company, please allow us 1-2 business days to complete this process.  Drug prices often vary depending on where the prescription is filled and some pharmacies may offer cheaper prices.  The website www.goodrx.com contains coupons for medications through different pharmacies. The prices here do not account for what the cost may be with help from insurance (it may be cheaper with your insurance), but the website can give you the price if you did not use any insurance.  - You can print the associated coupon and take it with   your prescription to the pharmacy.  - You may also stop by our office during regular business hours and pick up a GoodRx coupon card.  - If you need your prescription sent electronically to a different pharmacy, notify our office through [facility] MyChart or by phone at [phone] option 4.     Si Usted Necesita Algo Despus de Su Visita  Tambin puede enviarnos un mensaje a travs de MyChart. Por lo general respondemos a los mensajes de MyChart en el transcurso de 1 a 2  das hbiles.  Para renovar recetas, por favor pida a su farmacia que se ponga en contacto con nuestra oficina. Nuestro nmero de fax es el [phone].  Si tiene un asunto urgente cuando la clnica est cerrada y que no puede esperar hasta el siguiente da hbil, puede llamar/localizar a su doctor(a) al nmero que aparece a continuacin.   Por favor, tenga en cuenta que aunque hacemos todo lo posible para estar disponibles para asuntos urgentes fuera del horario de oficina, no estamos disponibles las 24 horas del da, los 7 das de la semana.   Si tiene un problema urgente y no puede comunicarse con nosotros, puede optar por buscar atencin mdica  en el consultorio de su doctor(a), en una clnica privada, en un centro de atencin urgente o en una sala de emergencias.  Si tiene una emergencia mdica, por favor llame inmediatamente al 911 o vaya a la sala de emergencias.  Nmeros de bper  - Dr. Kowalski: [phone]  - Dra. Moye: [phone]  - Dra. Stewart: [phone]  En caso de inclemencias del tiempo, por favor llame a nuestra lnea principal al [phone] para una actualizacin sobre el estado de cualquier retraso o cierre.  Consejos para la medicacin en dermatologa: Por favor, guarde las cajas en las que vienen los medicamentos de uso tpico para ayudarle a seguir las instrucciones sobre dnde y cmo usarlos. Las farmacias generalmente imprimen las instrucciones del medicamento slo en las cajas y no directamente en los tubos del medicamento.   Si su medicamento es muy caro, por favor, pngase en contacto con nuestra oficina llamando al [phone] y presione la opcin 4 o envenos un mensaje a travs de MyChart.   No podemos decirle cul ser su copago por los medicamentos por adelantado ya que esto es diferente dependiendo de la cobertura de su seguro. Sin embargo, es posible que podamos encontrar un medicamento sustituto a menor costo o llenar un formulario para que el  seguro cubra el medicamento que se considera necesario.   Si se requiere una autorizacin previa para que su compaa de seguros cubra su medicamento, por favor permtanos de 1 a 2 das hbiles para completar este proceso.  Los precios de los medicamentos varan con frecuencia dependiendo del lugar de dnde se surte la receta y alguna farmacias pueden ofrecer precios ms baratos.  El sitio web www.goodrx.com tiene cupones para medicamentos de diferentes farmacias. Los precios aqu no tienen en cuenta lo que podra costar con la ayuda del seguro (puede ser ms barato con su seguro), pero el sitio web puede darle el precio si no utiliz ningn seguro.  - Puede imprimir el cupn correspondiente y llevarlo con su receta a la farmacia.  - Tambin puede pasar por nuestra oficina durante el horario de atencin regular y recoger una tarjeta de cupones de GoodRx.  - Si necesita que su receta se enve electrnicamente a una farmacia diferente, informe a nuestra oficina a travs de MyChart de [place]   o por telfono llamando al [phone] y presione la opcin 4.  

## 2022-04-06 NOTE — Progress Notes (Signed)
Follow-Up Visit   Subjective  Nicole Washington is a 72 y.o. female who presents for the following: Procedure (Patient here today to treat face with BBL. ).  She also has new cold sores develop on lower lip.    The following portions of the chart were reviewed this encounter and updated as appropriate:       Review of Systems:  No other skin or systemic complaints except as noted in HPI or Assessment and Plan.  Objective  Well appearing patient in no apparent distress; mood and affect are within normal limits.  A focused examination was performed including face. Relevant physical exam findings are noted in the Assessment and Plan.  face Brown macules face           lower lip Clustered crusted vesicles x 2 lower lip   face Dilated vessels mid face at cheeks       Assessment & Plan  Lentigines face  BBL today Laser kit and gel cooling mask given to patient.  Photorejuvenation - face Prior to the procedure, the patient's past medical history, medications, allergies, and the rare but potential risks and complications were reviewed with the patient and a signed consent was obtained.  Pre and post treatment care was discussed and instructions provided.   [place] BBL - 04/06/22 1400      Patient Details   Skin Type: II    Anesthestic Cream Applied: No    Photo Takes: Yes    Consent Signed: Yes      Treatment Details   Date: 04/06/22    Treatment #: 1    Area: face    Filter: 1st Pass;2nd Pass       2nd Pass   Location: F    Device: 515 filter    BBL j/cm2: 12    PW Msec Sec: 10    Cooling Temp: 25    Pulses: 77    15x45: this crystal used    15x15: this crystal used      Patient tolerated the procedure well.   Nancy Fetter avoidance was stressed. The patient will call with any problems, questions or concerns prior to their next appointment.    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 Condition is bothersome/symptomatic for patient. Currently resolving.   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  It is infectious and can be spread from person to person by direct contact.  It is not curable, but is treatable with topical and oral medication.  Start Valtrex 1 gram 2 po at onset of symptoms then 2 po 12 hours later, start today  Perioral area including chin/upper lip not treated today with laser  valACYclovir (VALTREX) 1000 MG tablet - lower lip Take 1 tablet (1,000 mg total) by mouth as directed. At onset of fever blister symptoms take 2 tablets po then 12 hours later take 2 tablets po  Telangiectasia face  BBL today  Photorejuvenation - face Prior to the procedure, the patient's past medical history, medications, allergies, and the rare but potential risks and complications were reviewed with the patient and a signed consent was obtained.  Pre and post treatment care was discussed and instructions provided.   [place] BBL - 04/06/22 1400      Patient Details  Skin Type: II    Anesthestic Cream Applied: No    Photo Takes: Yes    Consent Signed: Yes      Treatment Details   Date: 04/06/22    Treatment #: 1    Area: face    Filter: 1st Pass;2nd Pass      1st Pass   Location: F    Device: 560 filter    BBL j/cm2: 26    PW Msec Sec: 27    Cooling Temp: 20    Pulses: 59    62m: this crystal used     Patient tolerated the procedure well.   SNancy Fetteravoidance was stressed. The patient will call with any problems, questions or concerns prior to their next appointment.     Return in about 1 month (around 05/05/2022) for BBL f/u.  I, SOthelia Pulling RMA, am acting as scribe for TBrendolyn Patty MD .  Documentation: I have reviewed the above documentation for accuracy and  completeness, and I agree with the above.  TBrendolyn PattyMD

## 2022-04-09 DIAGNOSIS — Z961 Presence of intraocular lens: Secondary | ICD-10-CM | POA: Diagnosis not present

## 2022-04-22 ENCOUNTER — Other Ambulatory Visit: Payer: Self-pay | Admitting: Family Medicine

## 2022-05-06 ENCOUNTER — Encounter: Payer: Self-pay | Admitting: Dermatology

## 2022-05-06 ENCOUNTER — Ambulatory Visit (INDEPENDENT_AMBULATORY_CARE_PROVIDER_SITE_OTHER): Payer: Medicare Other | Admitting: Dermatology

## 2022-05-06 VITALS — BP 114/79 | HR 91

## 2022-05-06 DIAGNOSIS — I781 Nevus, non-neoplastic: Secondary | ICD-10-CM

## 2022-05-06 DIAGNOSIS — L820 Inflamed seborrheic keratosis: Secondary | ICD-10-CM | POA: Diagnosis not present

## 2022-05-06 DIAGNOSIS — L814 Other melanin hyperpigmentation: Secondary | ICD-10-CM

## 2022-05-06 DIAGNOSIS — L719 Rosacea, unspecified: Secondary | ICD-10-CM

## 2022-05-06 NOTE — Patient Instructions (Addendum)
Cryotherapy Aftercare  Wash gently with soap and water everyday.   Apply Vaseline and Band-Aid daily until healed.     Due to recent changes in healthcare laws, you may see results of your pathology and/or laboratory studies on MyChart before the doctors have had a chance to review them. We understand that in some cases there may be results that are confusing or concerning to you. Please understand that not all results are received at the same time and often the doctors may need to interpret multiple results in order to provide you with the best plan of care or course of treatment. Therefore, we ask that you please give us 2 business days to thoroughly review all your results before contacting the office for clarification. Should we see a critical lab result, you will be contacted sooner.   If You Need Anything After Your Visit  If you have any questions or concerns for your doctor, please call our main line at [phone] and press option 4 to reach your doctor's medical assistant. If no one answers, please leave a voicemail as directed and we will return your call as soon as possible. Messages left after 4 pm will be answered the following business day.   You may also send us a message via MyChart. We typically respond to MyChart messages within 1-2 business days.  For prescription refills, please ask your pharmacy to contact our office. Our fax number is [phone].  If you have an urgent issue when the clinic is closed that cannot wait until the next business day, you can page your doctor at the number below.    Please note that while we do our best to be available for urgent issues outside of office hours, we are not available 24/7.   If you have an urgent issue and are unable to reach us, you may choose to seek medical care at your doctor's office, retail clinic, urgent care center, or emergency room.  If you have a medical emergency, please immediately call 911 or go to the  emergency department.  Pager Numbers  - Dr. Kowalski: [phone]  - Dr. Moye: [phone]  - Dr. Stewart: [phone]  In the event of inclement weather, please call our main line at [phone] for an update on the status of any delays or closures.  Dermatology Medication Tips: Please keep the boxes that topical medications come in in order to help keep track of the instructions about where and how to use these. Pharmacies typically print the medication instructions only on the boxes and not directly on the medication tubes.   If your medication is too expensive, please contact our office at [phone] option 4 or send us a message through MyChart.   We are unable to tell what your co-pay for medications will be in advance as this is different depending on your insurance coverage. However, we may be able to find a substitute medication at lower cost or fill out paperwork to get insurance to cover a needed medication.   If a prior authorization is required to get your medication covered by your insurance company, please allow us 1-2 business days to complete this process.  Drug prices often vary depending on where the prescription is filled and some pharmacies may offer cheaper prices.  The website www.goodrx.com contains coupons for medications through different pharmacies. The prices here do not account for what the cost may be with help from insurance (it may be cheaper with your insurance), but the website can   give you the price if you did not use any insurance.  - You can print the associated coupon and take it with your prescription to the pharmacy.  - You may also stop by our office during regular business hours and pick up a GoodRx coupon card.  - If you need your prescription sent electronically to a different pharmacy, notify our office through [facility] MyChart or by phone at [phone] option 4.     Si Usted Necesita Algo Despus de Su Visita  Tambin puede  enviarnos un mensaje a travs de MyChart. Por lo general respondemos a los mensajes de MyChart en el transcurso de 1 a 2 das hbiles.  Para renovar recetas, por favor pida a su farmacia que se ponga en contacto con nuestra oficina. Nuestro nmero de fax es el [phone].  Si tiene un asunto urgente cuando la clnica est cerrada y que no puede esperar hasta el siguiente da hbil, puede llamar/localizar a su doctor(a) al nmero que aparece a continuacin.   Por favor, tenga en cuenta que aunque hacemos todo lo posible para estar disponibles para asuntos urgentes fuera del horario de oficina, no estamos disponibles las 24 horas del da, los 7 das de la semana.   Si tiene un problema urgente y no puede comunicarse con nosotros, puede optar por buscar atencin mdica  en el consultorio de su doctor(a), en una clnica privada, en un centro de atencin urgente o en una sala de emergencias.  Si tiene una emergencia mdica, por favor llame inmediatamente al 911 o vaya a la sala de emergencias.  Nmeros de bper  - Dr. Kowalski: [phone]  - Dra. Moye: [phone]  - Dra. Stewart: [phone]  En caso de inclemencias del tiempo, por favor llame a nuestra lnea principal al [phone] para una actualizacin sobre el estado de cualquier retraso o cierre.  Consejos para la medicacin en dermatologa: Por favor, guarde las cajas en las que vienen los medicamentos de uso tpico para ayudarle a seguir las instrucciones sobre dnde y cmo usarlos. Las farmacias generalmente imprimen las instrucciones del medicamento slo en las cajas y no directamente en los tubos del medicamento.   Si su medicamento es muy caro, por favor, pngase en contacto con nuestra oficina llamando al [phone] y presione la opcin 4 o envenos un mensaje a travs de MyChart.   No podemos decirle cul ser su copago por los medicamentos por adelantado ya que esto es diferente dependiendo de la cobertura de su seguro.  Sin embargo, es posible que podamos encontrar un medicamento sustituto a menor costo o llenar un formulario para que el seguro cubra el medicamento que se considera necesario.   Si se requiere una autorizacin previa para que su compaa de seguros cubra su medicamento, por favor permtanos de 1 a 2 das hbiles para completar este proceso.  Los precios de los medicamentos varan con frecuencia dependiendo del lugar de dnde se surte la receta y alguna farmacias pueden ofrecer precios ms baratos.  El sitio web www.goodrx.com tiene cupones para medicamentos de diferentes farmacias. Los precios aqu no tienen en cuenta lo que podra costar con la ayuda del seguro (puede ser ms barato con su seguro), pero el sitio web puede darle el precio si no utiliz ningn seguro.  - Puede imprimir el cupn correspondiente y llevarlo con su receta a la farmacia.  - Tambin puede pasar por nuestra oficina durante el horario de atencin regular y recoger una tarjeta de cupones de GoodRx.  -   Si necesita que su receta se enve electrnicamente a una farmacia diferente, informe a nuestra oficina a travs de MyChart de [facility] o por telfono llamando al [phone] y presione la opcin 4.  

## 2022-05-06 NOTE — Progress Notes (Signed)
   Follow-Up Visit   Subjective  Nicole Washington is a 72 y.o. female who presents for the following: Rosacea and lentigos face, 27m f/u from BBL treatment, check spot left shoulder, itchy.   The following portions of the chart were reviewed this encounter and updated as appropriate: medications, allergies, medical history  Review of Systems:  No other skin or systemic complaints except as noted in HPI or Assessment and Plan.  Objective  Well appearing patient in no apparent distress; mood and affect are within normal limits.   A focused examination was performed of the following areas: face    Assessment & Plan   Lentigines - Scattered tan macules with lightening when compared to baseline photos - Due to sun exposure - Benign-appearing, improved with BBL, discussed additional treatments for more improvement, pt may schedule in fall. - Recommend daily broad spectrum sunscreen SPF 30+ to sun-exposed areas, reapply every 2 hours as needed. - Call for any changes  ROSACEA Exam Mid face erythema with telangiectasias Improved from BBL treatment  Treatment Plan Discussed another BBL, pt pleased with results and will wait for additional treatment  Rosacea is a chronic progressive skin condition usually affecting the face of adults, causing redness and/or acne bumps. It is treatable but not curable. It sometimes affects the eyes (ocular rosacea) as well. It may respond to topical and/or systemic medication and can flare with stress, sun exposure, alcohol, exercise, topical steroids (including hydrocortisone/cortisone 10) and some foods.  Daily application of broad spectrum spf 30+ sunscreen to face is recommended to reduce flares.   INFLAMED SEBORRHEIC KERATOSIS Top of L shoulder Symptomatic, irritating, patient would like treated.  Benign-appearing.  Call clinic for new or changing lesions.   Prior to procedure, discussed risks of blister formation, small wound, skin dyspigmentation,  or rare scar following treatment. Recommend Vaseline ointment to treated areas while healing.  Destruction Procedure Note Destruction method: cryotherapy   Informed consent: discussed and consent obtained   Lesion destroyed using liquid nitrogen: Yes   Cryotherapy cycles:  1 Outcome: patient tolerated procedure well with no complications   Post-procedure details: wound care instructions given   Locations: top of L shoulder x 1 # of Lesions Treated: 1   Return for as scheduled for TBSE , Hx of BCC, Hx of SCC.  I, Othelia Pulling, RMA, am acting as scribe for Brendolyn Patty, MD .   Documentation: I have reviewed the above documentation for accuracy and completeness, and I agree with the above.  Brendolyn Patty, MD

## 2022-05-11 ENCOUNTER — Encounter: Payer: Self-pay | Admitting: Family Medicine

## 2022-05-11 ENCOUNTER — Ambulatory Visit (INDEPENDENT_AMBULATORY_CARE_PROVIDER_SITE_OTHER): Payer: Medicare Other | Admitting: Family Medicine

## 2022-05-11 VITALS — BP 116/74 | HR 68 | Temp 98.0°F | Ht 62.0 in | Wt 178.6 lb

## 2022-05-11 DIAGNOSIS — M797 Fibromyalgia: Secondary | ICD-10-CM

## 2022-05-11 DIAGNOSIS — R413 Other amnesia: Secondary | ICD-10-CM

## 2022-05-11 DIAGNOSIS — K146 Glossodynia: Secondary | ICD-10-CM | POA: Diagnosis not present

## 2022-05-11 DIAGNOSIS — R7303 Prediabetes: Secondary | ICD-10-CM

## 2022-05-11 DIAGNOSIS — E038 Other specified hypothyroidism: Secondary | ICD-10-CM | POA: Diagnosis not present

## 2022-05-11 DIAGNOSIS — E785 Hyperlipidemia, unspecified: Secondary | ICD-10-CM

## 2022-05-11 DIAGNOSIS — I10 Essential (primary) hypertension: Secondary | ICD-10-CM

## 2022-05-11 DIAGNOSIS — F32A Depression, unspecified: Secondary | ICD-10-CM

## 2022-05-11 MED ORDER — HYDROCODONE-ACETAMINOPHEN 5-325 MG PO TABS: 1.0000 | ORAL_TABLET | Freq: Four times a day (QID) | ORAL | 0 refills | Status: DC | PRN

## 2022-05-11 NOTE — Patient Instructions (Signed)
Nice to see you. I have prescribed Vicodin to use for your fibromyalgia pain with this acute flare.  Please monitor for excessive drowsiness with this and breathing issues.  If those develop you need to be evaluated. Will contact you with your lab results. I am going to check with one of my colleagues the criteria for [facility] for Medicare patients.

## 2022-05-12 ENCOUNTER — Telehealth: Payer: Self-pay

## 2022-05-12 LAB — TSH: TSH: 1.46 u[IU]/mL (ref 0.35–5.50)

## 2022-05-12 LAB — HEMOGLOBIN A1C: Hgb A1c MFr Bld: 6.6 % — ABNORMAL HIGH (ref 4.6–6.5)

## 2022-05-12 NOTE — Telephone Encounter (Signed)
Left message to call the office back regarding his lab results. 

## 2022-05-12 NOTE — Telephone Encounter (Signed)
Pt returned [facility] call concerning results. Unable to transfer. Note below from provider was read to pt. Pt aware. As per pt request, she would like to use [facility] for that med. Any questions, she's @[phone].

## 2022-05-12 NOTE — Telephone Encounter (Signed)
-----   Message from Leone Haven, MD sent at 05/12/2022 12:51 PM EDT ----- Please let the patient know that her hemoglobin is back into the diabetic range.  Given that she has had 2 A1c's in the diabetic range over the last couple of years this would classify her as having type 2 diabetes.  We could consider restarting Ozempic to help treat this.  If she would like that I can send it to her pharmacy.

## 2022-05-12 NOTE — Telephone Encounter (Signed)
noted 

## 2022-05-13 NOTE — Progress Notes (Signed)
Tommi Rumps, MD Phone: [phone]  Nicole Washington is a 71 y.o. female who presents today for f/u.  Prediabetes: Patient does note some polydipsia.  She has been eating fruits and vegetables and lean meats.  Does note she has cut down on sugar.  No exercise.  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  Subclinical hypothyroidism: No skin changes, heat or cold intolerance.  She notes some weight gain.  Fibromyalgia: Patient notes periodic flares of fibromyalgia that really bother her.  She notes the only thing in the past that was helpful was oxycodone.  She notes tramadol has not been beneficial.  She gets a flare once every couple of months.  She also reports degenerative disc disease in the past and had injections previously.  Social History   Tobacco Use  Smoking Status Former   Types: Cigarettes   Quit date: 12/20/1989   Years since quitting: 32.4  Smokeless Tobacco Never    Current Outpatient Medications on File Prior to Visit  Medication Sig Dispense Refill   amitriptyline (ELAVIL) 10 MG tablet amitriptyline 10 mg tablet  Take 1 tablet twice a day by oral route for 90 days.     amLODipine (NORVASC) 5 MG tablet TAKE 1 TABLET BY MOUTH EVERY DAY 90 tablet 1   atorvastatin (LIPITOR) 40 MG tablet TAKE 1 TABLET BY MOUTH EVERY DAY 90 tablet 1   carvedilol (COREG) 3.125 MG tablet TAKE 1 TABLET BY MOUTH TWICE A DAY WITH FOOD 180 tablet 1   clonazePAM (KLONOPIN) 0.5 MG tablet TAKE TWO TABLETS BY MOUTH DAILY AS NEEDED FOR ANXIETY 60 tablet 0   DULoxetine (CYMBALTA) 60 MG capsule Take 60 mg by mouth 2 (two) times daily.     losartan (COZAAR) 50 MG tablet TAKE 1 TABLET BY MOUTH EVERY DAY 90 tablet 3   omeprazole (PRILOSEC) 20 MG capsule TAKE 1 CAPSULE BY MOUTH EVERY DAY 90 capsule 1   pregabalin (LYRICA) 150 MG capsule Take 150 mg by mouth 2 (two) times daily.     sertraline (ZOLOFT) 100 MG tablet Take 100 mg by mouth daily.     traZODone (DESYREL) 100 MG  tablet Take 100 mg by mouth at bedtime.     valACYclovir (VALTREX) 1000 MG tablet Take 1 tablet (1,000 mg total) by mouth as directed. At onset of fever blister symptoms take 2 tablets po then 12 hours later take 2 tablets po 30 tablet 6   No current facility-administered medications on file prior to visit.     ROS see history of present illness  Objective  Physical Exam Vitals:   05/11/22 1628 05/11/22 1652  BP: 126/80 116/74  Pulse: 68   Temp: 98 F (36.7 C)   SpO2: 98%     BP Readings from Last 3 Encounters:  05/11/22 116/74  05/06/22 114/79  04/06/22 122/79   Wt Readings from Last 3 Encounters:  05/11/22 178 lb 9.6 oz (81 kg)  03/09/22 180 lb (81.6 kg)  02/23/22 178 lb (80.7 kg)    Physical Exam Constitutional:      General: She is not in acute distress.    Appearance: She is not diaphoretic.  Cardiovascular:     Rate and Rhythm: Normal rate and regular rhythm.     Heart sounds: Normal heart sounds.  Pulmonary:     Effort: Pulmonary effort is normal.     Breath sounds: Normal breath sounds.  Musculoskeletal:     Comments: Tender to palpation along the paraspinous muscles  of her upper back  Skin:    General: Skin is warm and dry.  Neurological:     Mental Status: She is alert.      Assessment/Plan: Please see individual problem list.  Prediabetes Assessment & Plan: Chronic issue.  Check A1c.  Encouraged healthy diet and exercise.  Orders: -     Hemoglobin A1c  Subclinical hypothyroidism Assessment & Plan: Chronic issue.  Check TSH.  Orders: -     TSH  Fibromyalgia Assessment & Plan: Chronic issue.  Having a current flare.  We had a discussion regarding narcotics for this issue.  Discussed that chronic narcotic use for fibromyalgia is not appropriate though given her acute pain we could try a small number of Vicodin tablets for the acute issue.  If this becomes a recurrent request we would have to refer her to pain management.  She will continue  on Lyrica and can use as needed Tylenol as well.  Orders: -     HYDROcodone-Acetaminophen; Take 1 tablet by mouth every 6 (six) hours as needed for moderate pain.  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  She can continue clonazepam 0.5 mg twice daily as needed for burning mouth syndrome.     Return in about 3 months (around 08/11/2022) for Klonopin follow-up.   Tommi Rumps, MD [place]

## 2022-05-13 NOTE — Assessment & Plan Note (Signed)
Chronic issue.  Check TSH.

## 2022-05-13 NOTE — Assessment & Plan Note (Signed)
Chronic issue.  Check A1c.  Encouraged healthy diet and exercise.

## 2022-05-13 NOTE — Assessment & Plan Note (Signed)
Chronic issue.  Having a current flare.  We had a discussion regarding narcotics for this issue.  Discussed that chronic narcotic use for fibromyalgia is not appropriate though given her acute pain we could try a small number of Vicodin tablets for the acute issue.  If this becomes a recurrent request we would have to refer her to pain management.  She will continue on Lyrica and can use as needed Tylenol as well.

## 2022-05-13 NOTE — Assessment & Plan Note (Signed)
Chronic issue.  She can continue clonazepam 0.5 mg twice daily as needed for burning mouth syndrome.

## 2022-05-15 ENCOUNTER — Encounter: Payer: Self-pay | Admitting: Family Medicine

## 2022-05-18 ENCOUNTER — Other Ambulatory Visit: Payer: Self-pay | Admitting: Family Medicine

## 2022-05-18 DIAGNOSIS — E1165 Type 2 diabetes mellitus with hyperglycemia: Secondary | ICD-10-CM

## 2022-05-18 MED ORDER — SEMAGLUTIDE(0.25 OR 0.5MG/DOS) 2 MG/3ML ~~LOC~~ SOPN: PEN_INJECTOR | SUBCUTANEOUS | 3 refills | Status: AC

## 2022-05-19 ENCOUNTER — Telehealth: Payer: Self-pay | Admitting: Family Medicine

## 2022-05-19 DIAGNOSIS — G8929 Other chronic pain: Secondary | ICD-10-CM

## 2022-05-19 DIAGNOSIS — M797 Fibromyalgia: Secondary | ICD-10-CM

## 2022-05-19 DIAGNOSIS — M5136 Other intervertebral disc degeneration, lumbar region: Secondary | ICD-10-CM

## 2022-05-19 NOTE — Telephone Encounter (Signed)
Pt need PA for ozempic  

## 2022-05-21 ENCOUNTER — Telehealth: Payer: Self-pay

## 2022-05-21 ENCOUNTER — Other Ambulatory Visit (HOSPITAL_COMMUNITY): Payer: Self-pay

## 2022-05-21 NOTE — Telephone Encounter (Signed)
Pharmacy Patient Advocate Encounter   Received notification from Kristopher Oppenheim that prior authorization for Ozempic (0.25 or 0.5 MG/DOSE) 2MG /3ML pen-injectors is required/requested.  Per Test Claim: PA required   PA submitted on 05/21/22 to (ins) [employer] via CoverMyMeds [employer] or [facility]) confirmation #  [id number] Status is pending

## 2022-05-21 NOTE — Telephone Encounter (Signed)
PA submitted via CMM. KeyGC:[id number]. Status pending

## 2022-05-22 NOTE — Telephone Encounter (Signed)
Patient would like a referral to the Pain clinic

## 2022-05-22 NOTE — Telephone Encounter (Signed)
PA approved. Effective dates: 02/16/22 through 02/16/23 

## 2022-05-22 NOTE — Telephone Encounter (Signed)
Patient Advocate Encounter  Prior Authorization for Ozempic (0.25 or 0.5 MG/DOSE) 2MG /3ML pen-injectors has been approved.    PA# [id number] Effective dates: 02/16/22 through 02/16/23

## 2022-05-22 NOTE — Telephone Encounter (Signed)
I can refer her to a pain management clinic if she would like.

## 2022-05-22 NOTE — Telephone Encounter (Signed)
Called Patient to let her know that the PA for the Ozempic is approved through 02/16/23. Patient would like to know what Dr. Birdie Sons is going to do about her pain management. Patient states they discussed this at her last appointment 2 weeks ago and she has not heard anything else about it. Please advise

## 2022-05-25 NOTE — Addendum Note (Signed)
Addended by: [name] on: 05/25/2022 09:20 AM   Modules accepted: Orders

## 2022-05-25 NOTE — Telephone Encounter (Signed)
Referral placed.

## 2022-05-28 ENCOUNTER — Other Ambulatory Visit: Payer: Self-pay | Admitting: Family Medicine

## 2022-06-02 ENCOUNTER — Encounter: Payer: Self-pay | Admitting: Nurse Practitioner

## 2022-06-02 ENCOUNTER — Ambulatory Visit (INDEPENDENT_AMBULATORY_CARE_PROVIDER_SITE_OTHER): Payer: Medicare Other

## 2022-06-02 ENCOUNTER — Ambulatory Visit (INDEPENDENT_AMBULATORY_CARE_PROVIDER_SITE_OTHER): Payer: Medicare Other | Admitting: Nurse Practitioner

## 2022-06-02 VITALS — BP 136/86 | HR 89 | Temp 97.3°F | Ht 62.0 in | Wt 175.2 lb

## 2022-06-02 DIAGNOSIS — R062 Wheezing: Secondary | ICD-10-CM | POA: Insufficient documentation

## 2022-06-02 DIAGNOSIS — R058 Other specified cough: Secondary | ICD-10-CM | POA: Diagnosis not present

## 2022-06-02 DIAGNOSIS — R059 Cough, unspecified: Secondary | ICD-10-CM | POA: Insufficient documentation

## 2022-06-02 DIAGNOSIS — J9811 Atelectasis: Secondary | ICD-10-CM | POA: Diagnosis not present

## 2022-06-02 MED ORDER — HYDROCOD POLI-CHLORPHE POLI ER 10-8 MG/5ML PO SUER: 5.0000 mL | Freq: Every evening | ORAL | 0 refills | Status: DC | PRN

## 2022-06-02 MED ORDER — DOXYCYCLINE HYCLATE 100 MG PO TABS: 100.0000 mg | ORAL_TABLET | Freq: Two times a day (BID) | ORAL | 0 refills | Status: AC

## 2022-06-02 MED ORDER — PREDNISONE 10 MG PO TABS: ORAL_TABLET | ORAL | 0 refills | Status: DC

## 2022-06-02 NOTE — Progress Notes (Signed)
Established Patient Office Visit  Subjective:  Patient ID: Nicole Washington, female    DOB: 10/10/1950  Age: 72 y.o. MRN: [id number]  CC:  Chief Complaint  Patient presents with   Cough    Possible bronchitis wheezing last week and a half    HPI  Nicole Washington presents for cough   Cough This is a new problem. The current episode started 1 to 4 weeks ago. The problem has been gradually worsening. The cough is Productive of sputum. Associated symptoms include myalgias, nasal congestion, postnasal drip, shortness of breath and wheezing. Pertinent negatives include no chest pain, ear congestion, ear pain, fever, headaches or sore throat. Associated symptoms comments: fatigue. She has tried OTC cough suppressant for the symptoms. There is no history of asthma or COPD.     Past Medical History:  Diagnosis Date   Actinic keratosis    Basal cell carcinoma    Legs, L forehead, txted in past in [place]   Burning mouth syndrome    CAD (coronary artery disease)    Cancer    basal and squamous cell   Depression    GERD (gastroesophageal reflux disease)    Hyperlipidemia    Hypertension    Sleep apnea    CPAP   Squamous cell carcinoma of skin    R hand dorsum, L forehead, txted in [place]   Thyroid disease     Past Surgical History:  Procedure Laterality Date   BREAST BIOPSY Right yrs ago   benign, marker placed   CATARACT EXTRACTION W/PHACO Left 02/23/2022   Procedure: CATARACT EXTRACTION PHACO AND INTRAOCULAR LENS PLACEMENT (IOC) LEFT  3.78  00:31.8;  Surgeon: [name], MD;  Location: [facility];  Service: Ophthalmology;  Laterality: Left;  Sleep apnea   CATARACT EXTRACTION W/PHACO Right 03/09/2022   Procedure: CATARACT EXTRACTION PHACO AND INTRAOCULAR LENS PLACEMENT (IOC) RIGHT  4.31  00:37.6;  Surgeon: [name], MD;  Location: [facility];  Service: Ophthalmology;  Laterality: Right;  Sleep apnea   CESAREAN SECTION      Family History  Problem  Relation Age of Onset   Cerebral aneurysm Mother 45   COPD Father    Cancer Brother        ? colon   Lupus Other    Heart Problems Paternal Grandfather    Breast cancer Neg Hx     Social History   Socioeconomic History   Marital status: Married    Spouse name: Not on file   Number of children: Not on file   Years of education: Not on file   Highest education level: Not on file  Occupational History   Not on file  Tobacco Use   Smoking status: Former    Types: Cigarettes    Quit date: 12/20/1989    Years since quitting: 32.4   Smokeless tobacco: Never  Vaping Use   Vaping Use: Never used  Substance and Sexual Activity   Alcohol use: Yes    Comment: 4 times a year   Drug use: No   Sexual activity: Not on file  Other Topics Concern   Not on file  Social History Narrative   Not on file   Social Determinants of Health   Financial Resource Strain: Low Risk  (12/13/2020)   Overall Financial Resource Strain (CARDIA)    Difficulty of Paying Living Expenses: Not hard at all  Food Insecurity: No Food Insecurity (07/17/2020)   Hunger Vital Sign    Worried  About Running Out of Food in the Last Year: Never true    Ran Out of Food in the Last Year: Never true  Transportation Needs: No Transportation Needs (07/17/2020)   PRAPARE - Administrator, Civil Service (Medical): No    Lack of Transportation (Non-Medical): No  Physical Activity: Unknown (07/17/2020)   Exercise Vital Sign    Days of Exercise per Week: 0 days    Minutes of Exercise per Session: Not on file  Stress: No Stress Concern Present (07/17/2020)   [employer] of Occupational Health - Occupational Stress Questionnaire    Feeling of Stress : Not at all  Social Connections: Not on file  Intimate Partner Violence: Not At Risk (07/17/2020)   Humiliation, Afraid, Rape, and Kick questionnaire    Fear of Current or Ex-Partner: No    Emotionally Abused: No    Physically Abused: No    Sexually Abused: No      Outpatient Medications Prior to Visit  Medication Sig Dispense Refill   amitriptyline (ELAVIL) 10 MG tablet amitriptyline 10 mg tablet  Take 1 tablet twice a day by oral route for 90 days.     atorvastatin (LIPITOR) 40 MG tablet TAKE 1 TABLET BY MOUTH EVERY DAY 90 tablet 3   carvedilol (COREG) 3.125 MG tablet TAKE 1 TABLET BY MOUTH TWICE A DAY WITH FOOD 180 tablet 1   clonazePAM (KLONOPIN) 0.5 MG tablet TAKE TWO TABLETS BY MOUTH DAILY AS NEEDED FOR ANXIETY 60 tablet 0   DULoxetine (CYMBALTA) 60 MG capsule Take 60 mg by mouth 2 (two) times daily.     HYDROcodone-acetaminophen (NORCO/VICODIN) 5-325 MG tablet Take 1 tablet by mouth every 6 (six) hours as needed for moderate pain. 10 tablet 0   losartan (COZAAR) 50 MG tablet TAKE 1 TABLET BY MOUTH EVERY DAY 90 tablet 3   omeprazole (PRILOSEC) 20 MG capsule TAKE 1 CAPSULE BY MOUTH EVERY DAY 90 capsule 1   pregabalin (LYRICA) 150 MG capsule Take 150 mg by mouth 2 (two) times daily.     Semaglutide,0.25 or 0.5MG /DOS, 2 MG/3ML SOPN Inject 0.25 mg into the skin once a week for 14 days, THEN 0.5 mg once a week. 3 mL 3   sertraline (ZOLOFT) 100 MG tablet Take 100 mg by mouth daily.     traZODone (DESYREL) 100 MG tablet Take 100 mg by mouth at bedtime.     valACYclovir (VALTREX) 1000 MG tablet Take 1 tablet (1,000 mg total) by mouth as directed. At onset of fever blister symptoms take 2 tablets po then 12 hours later take 2 tablets po 30 tablet 6   amLODipine (NORVASC) 5 MG tablet TAKE 1 TABLET BY MOUTH EVERY DAY 90 tablet 1   No facility-administered medications prior to visit.    Allergies  Allergen Reactions   Codeine Itching   Ivp Dye [Iodinated Contrast Media] Itching   Pneumovax 23 [Pneumococcal Vac Polyvalent] Swelling    Patient had the vaccine on 09/18/2019 and stated her arm is swollen as large as a orange and red and sore.  [name],cma     ROS Review of Systems  Constitutional:  Positive for fatigue. Negative for fever.  HENT:   Positive for postnasal drip. Negative for ear pain and sore throat.   Respiratory:  Positive for cough, shortness of breath and wheezing.   Cardiovascular:  Negative for chest pain.  Musculoskeletal:  Positive for myalgias.  Neurological:  Negative for headaches.      Objective:  Physical Exam Constitutional:      Appearance: Normal appearance.  HENT:     Head: Normocephalic.     Right Ear: Tympanic membrane normal.     Left Ear: Tympanic membrane normal.     Nose: Nose normal.     Mouth/Throat:     Mouth: Mucous membranes are moist.     Pharynx: Oropharynx is clear.  Eyes:     Pupils: Pupils are equal, round, and reactive to light.  Cardiovascular:     Rate and Rhythm: Normal rate and regular rhythm.     Pulses: Normal pulses.     Heart sounds: Normal heart sounds.  Pulmonary:     Effort: Pulmonary effort is normal.     Breath sounds: Wheezing present.  Neurological:     General: No focal deficit present.     Mental Status: She is alert and oriented to person, place, and time. Mental status is at baseline.  Psychiatric:        Mood and Affect: Mood normal.        Behavior: Behavior normal.        Thought Content: Thought content normal.        Judgment: Judgment normal.     BP 136/86   Pulse 89   Temp (!) 97.3 F (36.3 C) (Oral)   Ht  (1.575 m)   Wt 175 lb 3.2 oz (79.5 kg)   SpO2 93%   BMI 32.04 kg/m  Wt Readings from Last 3 Encounters:  06/02/22 175 lb 3.2 oz (79.5 kg)  05/11/22 178 lb 9.6 oz (81 kg)  03/09/22 180 lb (81.6 kg)     Health Maintenance  Topic Date Due   DTaP/Tdap/Td (1 - Tdap) Never done   Medicare Annual Wellness (AWV)  07/17/2021   COVID-19 Vaccine (4 - 2023-24 season) 10/17/2021   INFLUENZA VACCINE  09/17/2022   MAMMOGRAM  04/04/2023   COLONOSCOPY (Pts 45-31yrs Insurance coverage will need to be confirmed)  10/06/2023   Pneumonia Vaccine 76+ Years old  Completed   DEXA SCAN  Completed   Hepatitis C Screening  Completed    Zoster Vaccines- Shingrix  Completed   HPV VACCINES  Aged Out    There are no preventive care reminders to display for this patient.  Lab Results  Component Value Date   TSH 1.46 05/11/2022   Lab Results  Component Value Date   WBC 6.1 09/18/2020   HGB 13.4 09/18/2020   HCT 40.0 09/18/2020   MCV 84.5 09/18/2020   PLT 361.0 09/18/2020   Lab Results  Component Value Date   NA 136 01/21/2022   K 3.8 01/21/2022   CO2 29 01/21/2022   GLUCOSE 79 01/21/2022   BUN 18 01/21/2022   CREATININE 0.87 01/21/2022   BILITOT 0.4 01/21/2022   ALKPHOS 110 01/21/2022   AST 15 01/21/2022   ALT 17 01/21/2022   PROT 6.8 01/21/2022   ALBUMIN 4.3 01/21/2022   CALCIUM 9.2 01/21/2022   GFR 66.56 01/21/2022   Lab Results  Component Value Date   CHOL 170 01/21/2022   Lab Results  Component Value Date   HDL 63.00 01/21/2022   Lab Results  Component Value Date   LDLCALC 79 01/21/2022   Lab Results  Component Value Date   TRIG 143.0 01/21/2022   Lab Results  Component Value Date   CHOLHDL 3 01/21/2022   Lab Results  Component Value Date   HGBA1C 6.6 (H) 05/11/2022      Assessment &  Plan:  Wheezing -     DG Chest 2 View -     Doxycycline Hyclate; Take 1 tablet (100 mg total) by mouth 2 (two) times daily for 10 days.  Dispense: 20 tablet; Refill: 0 -     predniSONE; Take 4 tablets ( total 40 mg) by mouth for 2 days; take 3 tablets ( total 30 mg) by mouth for 2 days; take 2 tablets ( total 20 mg) by mouth for 1 day; take 1 tablet ( total 10 mg) by mouth for 1 day.  Dispense: 17 tablet; Refill: 0  Other cough Assessment & Plan: Doxycycline, prednisone and  Tussionex sent to the pharmacy. Chest x-ray ordered.   Advised patient to increase fluid intake. If symptoms does not revolve resolve please call the office back for further evaluation.  Orders: -     DG Chest 2 View -     Doxycycline Hyclate; Take 1 tablet (100 mg total) by mouth 2 (two) times daily for 10 days.  Dispense:  20 tablet; Refill: 0 -     predniSONE; Take 4 tablets ( total 40 mg) by mouth for 2 days; take 3 tablets ( total 30 mg) by mouth for 2 days; take 2 tablets ( total 20 mg) by mouth for 1 day; take 1 tablet ( total 10 mg) by mouth for 1 day.  Dispense: 17 tablet; Refill: 0 -     Hydrocod Poli-Chlorphe Poli ER; Take 5 mLs by mouth at bedtime as needed for cough.  Dispense: 70 mL; Refill: 0    Follow-up: Return if symptoms worsen or fail to improve.   [name], NP

## 2022-06-02 NOTE — Patient Instructions (Addendum)
X rays ordered. Rx sent to the pharmacy. Increase fluid in take and Use humidifier or steam.  If symptoms not improving call the office for further evaluation.

## 2022-06-04 ENCOUNTER — Telehealth: Payer: Self-pay | Admitting: Family Medicine

## 2022-06-04 ENCOUNTER — Other Ambulatory Visit: Payer: Self-pay | Admitting: Family Medicine

## 2022-06-04 NOTE — Telephone Encounter (Signed)
Pt called in staying that she would like a phone call about her X-ray results from 4/16?

## 2022-06-05 NOTE — Telephone Encounter (Signed)
X-ray has not resulted yet. Will be visible on mychart once complete.

## 2022-06-07 NOTE — Assessment & Plan Note (Signed)
Doxycycline, prednisone and  Tussionex sent to the pharmacy. Chest x-ray ordered.   Advised patient to increase fluid intake. If symptoms does not revolve resolve please call the office back for further evaluation.

## 2022-06-09 ENCOUNTER — Other Ambulatory Visit: Payer: Self-pay | Admitting: Family Medicine

## 2022-06-09 DIAGNOSIS — Z1231 Encounter for screening mammogram for malignant neoplasm of breast: Secondary | ICD-10-CM

## 2022-06-10 DIAGNOSIS — F411 Generalized anxiety disorder: Secondary | ICD-10-CM | POA: Diagnosis not present

## 2022-06-10 DIAGNOSIS — F39 Unspecified mood [affective] disorder: Secondary | ICD-10-CM | POA: Diagnosis not present

## 2022-06-10 DIAGNOSIS — F5105 Insomnia due to other mental disorder: Secondary | ICD-10-CM | POA: Diagnosis not present

## 2022-06-10 DIAGNOSIS — F4312 Post-traumatic stress disorder, chronic: Secondary | ICD-10-CM | POA: Diagnosis not present

## 2022-06-17 ENCOUNTER — Telehealth: Payer: Self-pay | Admitting: Family Medicine

## 2022-06-17 DIAGNOSIS — K625 Hemorrhage of anus and rectum: Secondary | ICD-10-CM

## 2022-06-17 NOTE — Telephone Encounter (Signed)
I just want to clarify. Is she having bleeding after bowel movements or after urinating?

## 2022-06-17 NOTE — Telephone Encounter (Signed)
Noted. Referral placed. Please get more details. How long has this been going on? Is it with every bowel movement? Is she having pain with BMs? How much blood is it? Is she having any light headedness, chest pain, fatigue, or shortness of breath? Any other symptoms with this?

## 2022-06-17 NOTE — Telephone Encounter (Signed)
Pt called in staying that she would like a referral for Urology prefer a woman. Any questions or concern, she will like a phone call.

## 2022-06-17 NOTE — Telephone Encounter (Signed)
After bowel movements

## 2022-06-17 NOTE — Telephone Encounter (Signed)
Patient states she has been seeing bleeding after using the bathroom and she feels she needs a colonoscopy. Would like a referral to a woman gastroenterologist.

## 2022-06-18 NOTE — Telephone Encounter (Signed)
Attempted to call Patient but no answer/ voicemail. The call was regarding Dr. Purvis Sheffield questions regarding the referral he put in.

## 2022-06-18 NOTE — Telephone Encounter (Signed)
Patient called back and questions were ask to patient.  How long has this been going on? 2x in the pass week and a half.  Is it with every bowel movement? No  Is she having pain with BMs? No pain   How much blood is it? No blood in stool, patient notice blood when wiping.  Is she having any light headedness; yes, chest pain, no, fatigue; some, or shortness of breath; no? Any other symptoms with this? No  Patient already scheduled with [facility] on 06/25/2022.

## 2022-06-19 NOTE — Telephone Encounter (Signed)
Noted.  She should see GI as planned.

## 2022-06-25 ENCOUNTER — Ambulatory Visit (INDEPENDENT_AMBULATORY_CARE_PROVIDER_SITE_OTHER): Payer: Medicare Other | Admitting: Physician Assistant

## 2022-06-25 ENCOUNTER — Encounter: Payer: Self-pay | Admitting: Physician Assistant

## 2022-06-25 ENCOUNTER — Other Ambulatory Visit: Payer: Self-pay

## 2022-06-25 VITALS — BP 121/80 | HR 101 | Temp 98.4°F | Ht 62.0 in | Wt 175.0 lb

## 2022-06-25 DIAGNOSIS — K625 Hemorrhage of anus and rectum: Secondary | ICD-10-CM

## 2022-06-25 DIAGNOSIS — K581 Irritable bowel syndrome with constipation: Secondary | ICD-10-CM

## 2022-06-25 MED ORDER — PEG 3350-KCL-NA BICARB-NACL 420 G PO SOLR: 4000.0000 mL | Freq: Once | ORAL | 0 refills | Status: AC

## 2022-06-25 NOTE — Progress Notes (Signed)
[name], PA-C [address]  [address]  [place], [place] [address]  Main: [phone]  Fax: [phone]   Gastroenterology Consultation  Referring Provider:     [name], MD Primary Care Physician:  [name], MD Primary Gastroenterologist:  Dr. [name]  Reason for Consultation:     Rectal Bleeding, Constipation        HPI:   Nicole Washington is a 72 y.o. y/o female referred for consultation & management  by [name], MD.    Referred to evaluate rectal bleeding.  Patient states she had 2 episodes last week of mild bright red blood on the tissue after bowel movement.  She has not seen any blood in the toilet.  Rectal bleeding has currently resolved.  4 days ago she had severe lower abdominal cramping with increased constipation.  Lower abdominal pain resolved after she had bowel movement.  She states she has history of irritable bowel syndrome with chronic constipation.  Takes MiraLAX as needed.  She has not taken MiraLAX in a while.  She was started on Ozempic 1 month ago.  She has had normal CMP and TSH in the past 6 months.  Lab 09/2020 showed normal hemoglobin 13.4.  Last colonoscopy was done in [place] 09/2013 for colon cancer screening was normal.    No family history of colon cancer.  She previously saw Dr. Tobi Bastos in 2020 to evaluate elevated alkaline phosphatase.  Last labs 01/2022 showed normal alkaline phosphatase 110.  Normal LFTs.  She takes omeprazole 20 Mg daily with good control of acid reflux.  No blood thinners or aspirin.  Past Medical History:  Diagnosis Date   Actinic keratosis    Basal cell carcinoma    Legs, L forehead, txted in past in [place]   Burning mouth syndrome    CAD (coronary artery disease)    Cancer (HCC)    basal and squamous cell   Depression    GERD (gastroesophageal reflux disease)    Hyperlipidemia    Hypertension    Sleep apnea    CPAP   Squamous cell carcinoma of skin    R hand dorsum, L  forehead, txted in [place]   Thyroid disease     Past Surgical History:  Procedure Laterality Date   BREAST BIOPSY Right yrs ago   benign, marker placed   CATARACT EXTRACTION W/PHACO Left 02/23/2022   Procedure: CATARACT EXTRACTION PHACO AND INTRAOCULAR LENS PLACEMENT (IOC) LEFT  3.78  00:31.8;  Surgeon: [name], MD;  Location: [facility];  Service: Ophthalmology;  Laterality: Left;  Sleep apnea   CATARACT EXTRACTION W/PHACO Right 03/09/2022   Procedure: CATARACT EXTRACTION PHACO AND INTRAOCULAR LENS PLACEMENT (IOC) RIGHT  4.31  00:37.6;  Surgeon: [name], MD;  Location: [facility];  Service: Ophthalmology;  Laterality: Right;  Sleep apnea   CESAREAN SECTION      Prior to Admission medications   Medication Sig Start Date End Date Taking? Authorizing Provider  amitriptyline (ELAVIL) 10 MG tablet amitriptyline 10 mg tablet  Take 1 tablet twice a day by oral route for 90 days.   Yes [provider]  amLODipine (NORVASC) 5 MG tablet TAKE 1 TABLET BY MOUTH EVERY DAY 06/05/22  Yes [name], MD  atorvastatin (LIPITOR) 40 MG tablet TAKE 1 TABLET BY MOUTH EVERY DAY 05/28/22  Yes [name], MD  carvedilol (COREG) 3.125 MG tablet TAKE 1 TABLET BY MOUTH TWICE A DAY WITH  FOOD 01/14/22  Yes [name], MD  clonazePAM (KLONOPIN) 0.5 MG tablet TAKE TWO TABLETS BY MOUTH DAILY AS NEEDED FOR ANXIETY 04/22/22  Yes [name], MD  DULoxetine (CYMBALTA) 60 MG capsule Take 60 mg by mouth 2 (two) times daily.   Yes [provider]  losartan (COZAAR) 50 MG tablet TAKE 1 TABLET BY MOUTH EVERY DAY 05/30/21  Yes [name], MD  omeprazole (PRILOSEC) 20 MG capsule TAKE 1 CAPSULE BY MOUTH EVERY DAY 01/13/22  Yes [name], MD  pregabalin (LYRICA) 150 MG capsule Take 150 mg by mouth 2 (two) times daily. 07/27/19  Yes [provider]  Semaglutide,0.25 or 0.5MG /DOS, 2 MG/3ML SOPN Inject 0.25 mg into the skin once  a week for 14 days, THEN 0.5 mg once a week. 05/18/22 08/30/22 Yes [name], MD  sertraline (ZOLOFT) 100 MG tablet Take 100 mg by mouth daily. 04/20/18  Yes [provider]  traZODone (DESYREL) 100 MG tablet Take 100 mg by mouth at bedtime.   Yes [provider]  valACYclovir (VALTREX) 1000 MG tablet Take 1 tablet (1,000 mg total) by mouth as directed. At onset of fever blister symptoms take 2 tablets po then 12 hours later take 2 tablets po 04/06/22  Yes [name], MD    Family History  Problem Relation Age of Onset   Cerebral aneurysm Mother 42   COPD Father    Cancer Brother        ? colon   Lupus Other    Heart Problems Paternal Grandfather    Breast cancer Neg Hx      Social History   Tobacco Use   Smoking status: Former    Types: Cigarettes    Quit date: 12/20/1989    Years since quitting: 32.5   Smokeless tobacco: Never  Vaping Use   Vaping Use: Never used  Substance Use Topics   Alcohol use: Yes    Comment: 4 times a year   Drug use: No    Allergies as of 06/25/2022 - Review Complete 06/25/2022  Allergen Reaction Noted   Codeine Itching 10/27/2016   Ivp dye [iodinated contrast media] Itching 02/05/2022   Pneumovax 23 [pneumococcal vac polyvalent] Swelling 09/19/2019    Review of Systems:    All systems reviewed and negative except where noted in HPI.   Physical Exam:  BP 121/80   Pulse (!) 101   Temp 98.4 F (36.9 C)   Ht 5\' 2"  (1.575 m)   Wt 175 lb (79.4 kg)   BMI 32.01 kg/m  No LMP recorded. Patient is postmenopausal. Psych:  Alert and cooperative. Normal mood and affect. General:   Alert,  Well-developed, well-nourished, pleasant and cooperative in NAD Head:  Normocephalic and atraumatic. Eyes:  Sclera clear, no icterus.   Conjunctiva pink. Lungs:  Respirations even and unlabored.  Clear throughout to auscultation.   No wheezes, crackles, or rhonchi. No acute distress. Heart:  Regular rate and rhythm; no murmurs, clicks,  rubs, or gallops. Abdomen:  Normal bowel sounds.  No bruits.  Soft, Obese, and Non-Tender without masses, hepatosplenomegaly or hernias noted.  No guarding or rebound tenderness.    Neurologic:  Alert and oriented x3;  grossly normal neurologically. Psych:  Alert and cooperative. Normal mood and affect.  Imaging Studies: DG Chest 2 View  Result Date: 06/06/2022 CLINICAL DATA:  73 year old female with history of cough and wheezing. EXAM: CHEST - 2 VIEW COMPARISON:  Chest x-ray 07/02/2021. FINDINGS: Lung volumes  are normal. Linear opacity in the left lower lobe most compatible with subsegmental atelectasis. No consolidative airspace disease. No pleural effusions. No pneumothorax. No pulmonary nodule or mass noted. Pulmonary vasculature and the cardiomediastinal silhouette are within normal limits. IMPRESSION: 1. Minimal subsegmental atelectasis in the left lower lobe. No other radiographic evidence of acute cardiopulmonary disease. Electronically Signed   By: Trudie Reed M.D.   On: 06/06/2022 05:04    Assessment and Plan:   Williams Pizzi is a 72 y.o. y/o female has been referred for Rectal Bleeding and Constipation.  She had 2 episodes of very mild bright red blood on the tissue last week after bowel movements.  Has not had any blood in the toilet.  Rectal bleeding has currently resolved.  I am most suspicious for hemorrhoidal bleeding from constipation.  She has history of irritable bowel syndrome with chronic constipation.  I am scheduling an updated colonoscopy and adjusting treatment with follow-up.  Rectal Bleeding  Scheduling Colonoscopy I discussed risks of colonoscopy with patient to include risk of bleeding, colon perforation, and risk of sedation.   Patient expressed understanding and agrees to proceed with colonoscopy.   Irritable Bowel Syndrome with Constipation  Discussed constipation treatment at length. Recommend High Fiber diet with fruits, vegetables, and whole grains. Drink  64 ounces of Fluids Daily.  Gave samples of Linzess 72 mcg QD for 1 week, then 145 mcg QD for 1 week, then 290 mcg QD for 1 week.  She will let me know which dose works best, and then she can call me back for a prescription.  Follow up in 3 months for Constipation / IBS with TG.  [name], PA-C

## 2022-06-25 NOTE — Patient Instructions (Signed)
High-Fiber Eating Plan Fiber, also called dietary fiber, is a type of carbohydrate. It is found foods such as fruits, vegetables, whole grains, and beans. A high-fiber diet can have many health benefits. Your health care provider may recommend a high-fiber diet to help: Prevent constipation. Fiber can make your bowel movements more regular. Lower your cholesterol. Relieve the following conditions: Inflammation of veins in the anus (hemorrhoids). Inflammation of specific areas of the digestive tract (uncomplicated diverticulosis). A problem of the large intestine, also called the colon, that sometimes causes pain and diarrhea (irritable bowel syndrome, or IBS). Prevent overeating as part of a weight-loss plan. Prevent heart disease, type 2 diabetes, and certain cancers. What are tips for following this plan? Reading food labels  Check the nutrition facts label on food products for the amount of dietary fiber. Choose foods that have 5 grams of fiber or more per serving. The goals for recommended daily fiber intake include: Men (age 50 or younger): 34-38 g. Men (over age 50): 28-34 g. Women (age 50 or younger): 25-28 g. Women (over age 50): 22-25 g. Your daily fiber goal is _____________ g. Shopping Choose whole fruits and vegetables instead of processed forms, such as apple juice or applesauce. Choose a wide variety of high-fiber foods such as avocados, lentils, oats, and kidney beans. Read the nutrition facts label of the foods you choose. Be aware of foods with added fiber. These foods often have high sugar and sodium amounts per serving. Cooking Use whole-grain flour for baking and cooking. Cook with brown rice instead of white rice. Meal planning Start the day with a breakfast that is high in fiber, such as a cereal that contains 5 g of fiber or more per serving. Eat breads and cereals that are made with whole-grain flour instead of refined flour or white flour. Eat brown rice, bulgur  wheat, or millet instead of white rice. Use beans in place of meat in soups, salads, and pasta dishes. Be sure that half of the grains you eat each day are whole grains. General information You can get the recommended daily intake of dietary fiber by: Eating a variety of fruits, vegetables, grains, nuts, and beans. Taking a fiber supplement if you are not able to take in enough fiber in your diet. It is better to get fiber through food than from a supplement. Gradually increase how much fiber you consume. If you increase your intake of dietary fiber too quickly, you may have bloating, cramping, or gas. Drink plenty of water to help you digest fiber. Choose high-fiber snacks, such as berries, raw vegetables, nuts, and popcorn. What foods should I eat? Fruits Berries. Pears. Apples. Oranges. Avocado. Prunes and raisins. Dried figs. Vegetables Sweet potatoes. Spinach. Kale. Artichokes. Cabbage. Broccoli. Cauliflower. Green peas. Carrots. Squash. Grains Whole-grain breads. Multigrain cereal. Oats and oatmeal. Brown rice. Barley. Bulgur wheat. Millet. Quinoa. Bran muffins. Popcorn. Rye wafer crackers. Meats and other proteins Navy beans, kidney beans, and pinto beans. Soybeans. Split peas. Lentils. Nuts and seeds. Dairy Fiber-fortified yogurt. Beverages Fiber-fortified soy milk. Fiber-fortified orange juice. Other foods Fiber bars. The items listed above may not be a complete list of recommended foods and beverages. Contact a dietitian for more information. What foods should I avoid? Fruits Fruit juice. Cooked, strained fruit. Vegetables Fried potatoes. Canned vegetables. Well-cooked vegetables. Grains White bread. Pasta made with refined flour. White rice. Meats and other proteins Fatty cuts of meat. Fried chicken or fried fish. Dairy Milk. Yogurt. Cream cheese. Sour cream. Fats and   oils Butters. Beverages Soft drinks. Other foods Cakes and pastries. The items listed above may  not be a complete list of foods and beverages to avoid. Talk with your dietitian about what choices are best for you. Summary Fiber is a type of carbohydrate. It is found in foods such as fruits, vegetables, whole grains, and beans. A high-fiber diet has many benefits. It can help to prevent constipation, lower blood cholesterol, aid weight loss, and reduce your risk of heart disease, diabetes, and certain cancers. Increase your intake of fiber gradually. Increasing fiber too quickly may cause cramping, bloating, and gas. Drink plenty of water while you increase the amount of fiber you consume. The best sources of fiber include whole fruits and vegetables, whole grains, nuts, seeds, and beans. This information is not intended to replace advice given to you by your health care provider. Make sure you discuss any questions you have with your health care provider. Document Revised: 06/08/2019 Document Reviewed: 06/08/2019 Elsevier Patient Education  2023 Elsevier Inc. Constipation, Adult Constipation is when a person has fewer than three bowel movements in a week, has difficulty having a bowel movement, or has stools (feces) that are dry, hard, or larger than normal. Constipation may be caused by an underlying condition. It may become worse with age if a person takes certain medicines and does not take in enough fluids. Follow these instructions at home: Eating and drinking  Eat foods that have a lot of fiber, such as beans, whole grains, and fresh fruits and vegetables. Limit foods that are low in fiber and high in fat and processed sugars, such as fried or sweet foods. These include french fries, hamburgers, cookies, candies, and soda. Drink enough fluid to keep your urine pale yellow. General instructions Exercise regularly or as told by your health care provider. Try to do 150 minutes of moderate exercise each week. Use the bathroom when you have the urge to go. Do not hold it in. Take  over-the-counter and prescription medicines only as told by your health care provider. This includes any fiber supplements. During bowel movements: Practice deep breathing while relaxing the lower abdomen. Practice pelvic floor relaxation. Watch your condition for any changes. Let your health care provider know about them. Keep all follow-up visits as told by your health care provider. This is important. Contact a health care provider if: You have pain that gets worse. You have a fever. You do not have a bowel movement after 4 days. You vomit. You are not hungry or you lose weight. You are bleeding from the opening between the buttocks (anus). You have thin, pencil-like stools. Get help right away if: You have a fever and your symptoms suddenly get worse. You leak stool or have blood in your stool. Your abdomen is bloated. You have severe pain in your abdomen. You feel dizzy or you faint. Summary Constipation is when a person has fewer than three bowel movements in a week, has difficulty having a bowel movement, or has stools (feces) that are dry, hard, or larger than normal. Eat foods that have a lot of fiber, such as beans, whole grains, and fresh fruits and vegetables. Drink enough fluid to keep your urine pale yellow. Take over-the-counter and prescription medicines only as told by your health care provider. This includes any fiber supplements. This information is not intended to replace advice given to you by your health care provider. Make sure you discuss any questions you have with your health care provider. Document Revised:  12/17/2021 Document Reviewed: 12/17/2021 Elsevier Patient Education  2023 [employer].

## 2022-07-01 ENCOUNTER — Telehealth: Payer: Self-pay

## 2022-07-01 MED ORDER — PEG 3350-KCL-NA BICARB-NACL 420 G PO SOLR: 4000.0000 mL | Freq: Once | ORAL | 0 refills | Status: AC

## 2022-07-01 NOTE — Telephone Encounter (Signed)
Patient is calling because she states that her prep was called in to [facility] mail order and [facility] [address]. She states she would like this called in today.   She states she also would like a prescription for Linzess because this is helping her constipation and she would [facility].

## 2022-07-02 ENCOUNTER — Ambulatory Visit (INDEPENDENT_AMBULATORY_CARE_PROVIDER_SITE_OTHER): Payer: Medicare Other | Admitting: Nurse Practitioner

## 2022-07-02 ENCOUNTER — Encounter: Payer: Self-pay | Admitting: Nurse Practitioner

## 2022-07-02 ENCOUNTER — Ambulatory Visit (INDEPENDENT_AMBULATORY_CARE_PROVIDER_SITE_OTHER): Payer: Medicare Other

## 2022-07-02 ENCOUNTER — Telehealth: Payer: Self-pay

## 2022-07-02 VITALS — BP 116/66 | HR 98 | Temp 98.1°F | Ht 62.0 in | Wt 174.2 lb

## 2022-07-02 DIAGNOSIS — R051 Acute cough: Secondary | ICD-10-CM

## 2022-07-02 DIAGNOSIS — N951 Menopausal and female climacteric states: Secondary | ICD-10-CM | POA: Diagnosis not present

## 2022-07-02 DIAGNOSIS — R059 Cough, unspecified: Secondary | ICD-10-CM | POA: Diagnosis not present

## 2022-07-02 DIAGNOSIS — Z01419 Encounter for gynecological examination (general) (routine) without abnormal findings: Secondary | ICD-10-CM | POA: Diagnosis not present

## 2022-07-02 DIAGNOSIS — L723 Sebaceous cyst: Secondary | ICD-10-CM | POA: Diagnosis not present

## 2022-07-02 DIAGNOSIS — G894 Chronic pain syndrome: Secondary | ICD-10-CM | POA: Diagnosis not present

## 2022-07-02 MED ORDER — PSEUDOEPH-BROMPHEN-DM 30-2-10 MG/5ML PO SYRP
5.0000 mL | ORAL_SOLUTION | Freq: Four times a day (QID) | ORAL | 0 refills | Status: DC | PRN
Start: 2022-07-02 — End: 2022-07-08

## 2022-07-02 MED ORDER — LINACLOTIDE 72 MCG PO CAPS: 72.0000 ug | ORAL_CAPSULE | Freq: Every day | ORAL | 1 refills | Status: DC

## 2022-07-02 MED ORDER — METHYLPREDNISOLONE 4 MG PO TBPK
ORAL_TABLET | ORAL | 0 refills | Status: DC
Start: 2022-07-02 — End: 2022-07-08

## 2022-07-02 NOTE — Telephone Encounter (Signed)
Patient notified.  Yes, please send Rx for Linzess 72, 1 capsule daily, #90 (90 day supply), 1 RF

## 2022-07-02 NOTE — Progress Notes (Signed)
Bethanie Dicker, NP-C Phone: [phone]  Nicole Washington is a 72 y.o. female who presents today for follow up.  Patient was diagnosed with Bronchitis on 06/02/2022. She had a chest xray at that time and was advised to have a repeat one completed 3-4 weeks later to monitor. She was treated with Doxycycline and Prednisone and has completed both medications. She continues to have a cough. She denies congestion. Denies drainage. Denies sore throat. Denies fevers. Denies shortness of breath. She is no longer wheezing.   Social History   Tobacco Use  Smoking Status Former   Types: Cigarettes   Quit date: 12/20/1989   Years since quitting: 32.5  Smokeless Tobacco Never    Current Outpatient Medications on File Prior to Visit  Medication Sig Dispense Refill   amitriptyline (ELAVIL) 10 MG tablet amitriptyline 10 mg tablet  Take 1 tablet twice a day by oral route for 90 days.     amLODipine (NORVASC) 5 MG tablet TAKE 1 TABLET BY MOUTH EVERY DAY 90 tablet 1   atorvastatin (LIPITOR) 40 MG tablet TAKE 1 TABLET BY MOUTH EVERY DAY 90 tablet 3   carvedilol (COREG) 3.125 MG tablet TAKE 1 TABLET BY MOUTH TWICE A DAY WITH FOOD 180 tablet 1   clonazePAM (KLONOPIN) 0.5 MG tablet TAKE TWO TABLETS BY MOUTH DAILY AS NEEDED FOR ANXIETY 60 tablet 0   DULoxetine (CYMBALTA) 60 MG capsule Take 60 mg by mouth 2 (two) times daily.     losartan (COZAAR) 50 MG tablet TAKE 1 TABLET BY MOUTH EVERY DAY 90 tablet 3   omeprazole (PRILOSEC) 20 MG capsule TAKE 1 CAPSULE BY MOUTH EVERY DAY 90 capsule 1   pregabalin (LYRICA) 150 MG capsule Take 150 mg by mouth 2 (two) times daily.     Semaglutide,0.25 or 0.5MG /DOS, 2 MG/3ML SOPN Inject 0.25 mg into the skin once a week for 14 days, THEN 0.5 mg once a week. 3 mL 3   sertraline (ZOLOFT) 100 MG tablet Take 100 mg by mouth daily.     traZODone (DESYREL) 100 MG tablet Take 100 mg by mouth at bedtime.     valACYclovir (VALTREX) 1000 MG tablet Take 1 tablet (1,000 mg total) by mouth as  directed. At onset of fever blister symptoms take 2 tablets po then 12 hours later take 2 tablets po 30 tablet 6   No current facility-administered medications on file prior to visit.    ROS see history of present illness  Objective  Physical Exam Vitals:   07/02/22 1558  BP: 116/66  Pulse: 98  Temp: 98.1 F (36.7 C)  SpO2: 95%    BP Readings from Last 3 Encounters:  07/02/22 116/66  06/25/22 121/80  06/02/22 136/86   Wt Readings from Last 3 Encounters:  07/02/22 174 lb 3.2 oz (79 kg)  06/25/22 175 lb (79.4 kg)  06/02/22 175 lb 3.2 oz (79.5 kg)    Physical Exam Constitutional:      General: She is not in acute distress.    Appearance: Normal appearance.  HENT:     Head: Normocephalic.     Right Ear: Tympanic membrane normal.     Left Ear: Tympanic membrane normal.     Nose: Nose normal.     Mouth/Throat:     Mouth: Mucous membranes are moist.     Pharynx: Oropharynx is clear.  Eyes:     Conjunctiva/sclera: Conjunctivae normal.     Pupils: Pupils are equal, round, and reactive to light.  Cardiovascular:  Rate and Rhythm: Normal rate and regular rhythm.     Heart sounds: Normal heart sounds.  Pulmonary:     Effort: Pulmonary effort is normal.     Breath sounds: Normal breath sounds.  Lymphadenopathy:     Cervical: No cervical adenopathy.  Skin:    General: Skin is warm and dry.  Neurological:     General: No focal deficit present.     Mental Status: She is alert.  Psychiatric:        Mood and Affect: Mood normal.        Behavior: Behavior normal.    Assessment/Plan: Please see individual problem list.  Acute cough Assessment & Plan: Repeat chest xray today. Will contact her with the results. Will treat cough with Bromfed DM and MDP. Do not feel that additional antibiotics are warranted. Encouraged adequate fluid intake. Return precautions given to patient.   Orders: -     Pseudoeph-Bromphen-DM; Take 5-10 mLs by mouth every 6 (six) hours as  needed.  Dispense: 120 mL; Refill: 0 -     methylPREDNISolone; Take as directed.  Dispense: 21 each; Refill: 0 -     DG Chest 2 View; Future    Return if symptoms worsen or fail to improve.   Bethanie Dicker, NP-C [facility] [employer]

## 2022-07-03 ENCOUNTER — Ambulatory Visit
Admission: RE | Admit: 2022-07-03 | Discharge: 2022-07-03 | Disposition: A | Discharge status: Home / Self Care | Payer: Medicare Other | Source: Ambulatory Visit | Attending: Family Medicine | Admitting: Family Medicine

## 2022-07-03 DIAGNOSIS — Z1231 Encounter for screening mammogram for malignant neoplasm of breast: Secondary | ICD-10-CM | POA: Insufficient documentation

## 2022-07-07 ENCOUNTER — Telehealth: Payer: Self-pay

## 2022-07-07 DIAGNOSIS — M25511 Pain in right shoulder: Secondary | ICD-10-CM | POA: Diagnosis not present

## 2022-07-07 MED ORDER — LINACLOTIDE 72 MCG PO CAPS: 72.0000 ug | ORAL_CAPSULE | Freq: Every day | ORAL | 1 refills | Status: DC

## 2022-07-07 NOTE — Telephone Encounter (Signed)
Patient states she called 2 times last week to get the Linzess  refilled. She states [facility] [address] She states she has called them and they do not have it. It looks like it was [facility]. Please call patient when this is done

## 2022-07-07 NOTE — Telephone Encounter (Signed)
Resent Linzess 72 mcg to [facility] [address]. Left message on patients VM letting her know.

## 2022-07-07 NOTE — Assessment & Plan Note (Signed)
Repeat chest xray today. Will contact her with the results. Will treat cough with Bromfed DM and MDP. Do not feel that additional antibiotics are warranted. Encouraged adequate fluid intake. Return precautions given to patient.

## 2022-07-08 ENCOUNTER — Other Ambulatory Visit: Payer: Self-pay | Admitting: Family Medicine

## 2022-07-08 ENCOUNTER — Ambulatory Visit (INDEPENDENT_AMBULATORY_CARE_PROVIDER_SITE_OTHER): Payer: Medicare Other | Admitting: Family Medicine

## 2022-07-08 ENCOUNTER — Encounter: Payer: Self-pay | Admitting: Family Medicine

## 2022-07-08 VITALS — BP 124/80 | HR 91 | Temp 98.3°F | Ht 62.0 in | Wt 173.0 lb

## 2022-07-08 DIAGNOSIS — R61 Generalized hyperhidrosis: Secondary | ICD-10-CM | POA: Diagnosis not present

## 2022-07-08 DIAGNOSIS — I10 Essential (primary) hypertension: Secondary | ICD-10-CM | POA: Diagnosis not present

## 2022-07-08 DIAGNOSIS — E119 Type 2 diabetes mellitus without complications: Secondary | ICD-10-CM

## 2022-07-08 DIAGNOSIS — K219 Gastro-esophageal reflux disease without esophagitis: Secondary | ICD-10-CM

## 2022-07-08 DIAGNOSIS — Z7985 Long-term (current) use of injectable non-insulin antidiabetic drugs: Secondary | ICD-10-CM

## 2022-07-08 DIAGNOSIS — R051 Acute cough: Secondary | ICD-10-CM | POA: Diagnosis not present

## 2022-07-08 LAB — CBC WITH DIFFERENTIAL/PLATELET
Basophils Absolute: 0.1 10*3/uL (ref 0.0–0.1)
Basophils Relative: 0.5 % (ref 0.0–3.0)
Eosinophils Absolute: 0 10*3/uL (ref 0.0–0.7)
Eosinophils Relative: 0.2 % (ref 0.0–5.0)
HCT: 43.4 % (ref 36.0–46.0)
Hemoglobin: 13.9 g/dL (ref 12.0–15.0)
Lymphocytes Relative: 17 % (ref 12.0–46.0)
Lymphs Abs: 1.9 10*3/uL (ref 0.7–4.0)
MCHC: 32.1 g/dL (ref 30.0–36.0)
MCV: 83.4 fl (ref 78.0–100.0)
Monocytes Absolute: 0.7 10*3/uL (ref 0.1–1.0)
Monocytes Relative: 6.7 % (ref 3.0–12.0)
Neutro Abs: 8.3 10*3/uL — ABNORMAL HIGH (ref 1.4–7.7)
Neutrophils Relative %: 75.6 % (ref 43.0–77.0)
Platelets: 406 10*3/uL — ABNORMAL HIGH (ref 150.0–400.0)
RBC: 5.21 Mil/uL — ABNORMAL HIGH (ref 3.87–5.11)
RDW: 16.4 % — ABNORMAL HIGH (ref 11.5–15.5)
WBC: 11 10*3/uL — ABNORMAL HIGH (ref 4.0–10.5)

## 2022-07-08 LAB — COMPREHENSIVE METABOLIC PANEL
ALT: 20 U/L (ref 0–35)
AST: 16 U/L (ref 0–37)
Albumin: 4.3 g/dL (ref 3.5–5.2)
Alkaline Phosphatase: 107 U/L (ref 39–117)
BUN: 24 mg/dL — ABNORMAL HIGH (ref 6–23)
CO2: 31 mEq/L (ref 19–32)
Calcium: 9.4 mg/dL (ref 8.4–10.5)
Chloride: 97 mEq/L (ref 96–112)
Creatinine, Ser: 0.89 mg/dL (ref 0.40–1.20)
GFR: 64.56 mL/min (ref >60.00)
Glucose, Bld: 90 mg/dL (ref 70–99)
Potassium: 4 mEq/L (ref 3.5–5.1)
Sodium: 136 mEq/L (ref 135–145)
Total Bilirubin: 0.5 mg/dL (ref 0.2–1.2)
Total Protein: 7.5 g/dL (ref 6.0–8.3)

## 2022-07-08 LAB — TSH: TSH: 2.19 u[IU]/mL (ref 0.35–5.50)

## 2022-07-08 LAB — C-REACTIVE PROTEIN: CRP: <1 mg/dL (ref 0.5–20.0)

## 2022-07-08 MED ORDER — OMEPRAZOLE 20 MG PO CPDR: DELAYED_RELEASE_CAPSULE | ORAL | 1 refills | Status: DC

## 2022-07-08 MED ORDER — LOSARTAN POTASSIUM 50 MG PO TABS: 50.0000 mg | ORAL_TABLET | Freq: Every day | ORAL | 3 refills | Status: DC

## 2022-07-08 NOTE — Assessment & Plan Note (Signed)
Chronic issue.  Well-controlled.  Continue omeprazole 20 mg daily.

## 2022-07-08 NOTE — Assessment & Plan Note (Signed)
Patient likely had bronchitis.  This has resolved.  She will monitor for any recurrence.

## 2022-07-08 NOTE — Assessment & Plan Note (Signed)
Chronic issue.  Continue Ozempic 0.5 mg weekly.  Check A1c at next visit.

## 2022-07-08 NOTE — Assessment & Plan Note (Addendum)
Chronic issue.  Possibly related to psychiatric medications.  We will check with her psychiatrist on this.  Will get lab work as outlined.  HIV screening completed 3 years ago.

## 2022-07-08 NOTE — Assessment & Plan Note (Signed)
Chronic issue.  Adequately controlled.  Patient will continue losartan 50 mg daily, carvedilol 3.125 mg twice daily, and amlodipine 5 mg daily.

## 2022-07-08 NOTE — Progress Notes (Signed)
Marikay Alar, MD Phone: [phone]  Nicole Washington is a 72 y.o. female who presents today for f/u.  HYPERTENSION Disease Monitoring Home BP Monitoring not checking Chest pain- no    Dyspnea- no, notes getting a little winded when being active though not to the extent that it is worrisome Medications Compliance-  taking amlodipine, coreg, losartan.  Edema- no BMET    Component Value Date/Time   NA 136 07/08/2022 1201   NA 140 11/15/2018 1503   K 4.0 07/08/2022 1201   CL 97 07/08/2022 1201   CO2 31 07/08/2022 1201   GLUCOSE 90 07/08/2022 1201   BUN 24 (H) 07/08/2022 1201   BUN 17 11/15/2018 1503   CREATININE 0.89 07/08/2022 1201   CALCIUM 9.4 07/08/2022 1201   GFRNONAA 66 11/15/2018 1503   GFRAA 76 11/15/2018 1503   Diabetes: Patient restarted Ozempic recently.  She is tolerating this okay.  She has had some constipation and GI gave her some Linzess.  Bronchitis: Patient she is feeling significantly better.  She was initially treated with doxycycline though had to do a course of steroids and cough medication.  GERD: Patient notes no reflux symptoms on omeprazole.  Hot/cold flashes: Patient notes these been going on for 1 to 2 years.  They occur 1-2 times a week.  They last all night.  She notes drenching sweats at times with these.  She has not checked her temperature.  She notes the temperature in the house is 69-70 degrees.  She sleeps with just a sheet.  She still has hot flashes from being postmenopausal though notes the hot/cold flashes are different than those.  Social History   Tobacco Use  Smoking Status Former   Types: Cigarettes   Quit date: 12/20/1989   Years since quitting: 32.5  Smokeless Tobacco Never    Current Outpatient Medications on File Prior to Visit  Medication Sig Dispense Refill   amitriptyline (ELAVIL) 10 MG tablet amitriptyline 10 mg tablet  Take 1 tablet twice a day by oral route for 90 days.     amLODipine (NORVASC) 5 MG tablet TAKE 1  TABLET BY MOUTH EVERY DAY 90 tablet 1   atorvastatin (LIPITOR) 40 MG tablet TAKE 1 TABLET BY MOUTH EVERY DAY 90 tablet 3   carvedilol (COREG) 3.125 MG tablet TAKE 1 TABLET BY MOUTH TWICE A DAY WITH FOOD 180 tablet 1   clonazePAM (KLONOPIN) 0.5 MG tablet TAKE TWO TABLETS BY MOUTH DAILY AS NEEDED FOR ANXIETY 60 tablet 0   DULoxetine (CYMBALTA) 60 MG capsule Take 60 mg by mouth 2 (two) times daily.     linaclotide (LINZESS) 72 MCG capsule Take 1 capsule (72 mcg total) by mouth daily before breakfast. 30 capsule 1   pregabalin (LYRICA) 150 MG capsule Take 150 mg by mouth 2 (two) times daily.     Semaglutide,0.25 or 0.5MG /DOS, 2 MG/3ML SOPN Inject 0.25 mg into the skin once a week for 14 days, THEN 0.5 mg once a week. 3 mL 3   sertraline (ZOLOFT) 100 MG tablet Take 100 mg by mouth daily.     traZODone (DESYREL) 100 MG tablet Take 100 mg by mouth at bedtime.     valACYclovir (VALTREX) 1000 MG tablet Take 1 tablet (1,000 mg total) by mouth as directed. At onset of fever blister symptoms take 2 tablets po then 12 hours later take 2 tablets po 30 tablet 6   No current facility-administered medications on file prior to visit.     ROS see history  of present illness  Objective  Physical Exam Vitals:   07/08/22 1145  BP: 124/80  Pulse: 91  Temp: 98.3 F (36.8 C)  SpO2: 96%    BP Readings from Last 3 Encounters:  07/08/22 124/80  07/02/22 116/66  06/25/22 121/80   Wt Readings from Last 3 Encounters:  07/08/22 173 lb (78.5 kg)  07/02/22 174 lb 3.2 oz (79 kg)  06/25/22 175 lb (79.4 kg)    Physical Exam Constitutional:      General: She is not in acute distress.    Appearance: She is not diaphoretic.  Cardiovascular:     Rate and Rhythm: Normal rate and regular rhythm.     Heart sounds: Normal heart sounds.  Pulmonary:     Effort: Pulmonary effort is normal.     Breath sounds: Normal breath sounds.  Lymphadenopathy:     Cervical: No cervical adenopathy.  Skin:    General: Skin is  warm and dry.  Neurological:     Mental Status: She is alert.      Assessment/Plan: Please see individual problem list.  Type 2 diabetes mellitus without complication, without long-term current use of insulin (HCC) Assessment & Plan: Chronic issue.  Continue Ozempic 0.5 mg weekly.  Check A1c at next visit.  Orders: -     Microalbumin / creatinine urine ratio; Future  Hypertension, unspecified type Assessment & Plan: Chronic issue.  Adequately controlled.  Patient will continue losartan 50 mg daily, carvedilol 3.125 mg twice daily, and amlodipine 5 mg daily.  Orders: -     Losartan Potassium; Take 1 tablet (50 mg total) by mouth daily.  Dispense: 90 tablet; Refill: 3  Long-term current use of injectable noninsulin antidiabetic medication  Acute cough Assessment & Plan: Patient likely had bronchitis.  This has resolved.  She will monitor for any recurrence.   Night sweats Assessment & Plan: Chronic issue.  Possibly related to psychiatric medications.  We will check with her psychiatrist on this.  Will get lab work as outlined.  HIV screening completed 3 years ago.  Orders: -     Comprehensive metabolic panel -     CBC with Differential/Platelet -     TSH -     C-reactive protein -     POCT urinalysis dipstick; Future  Gastroesophageal reflux disease, unspecified whether esophagitis present Assessment & Plan: Chronic issue.  Well-controlled.  Continue omeprazole 20 mg daily.  Orders: -     Omeprazole; TAKE 1 CAPSULE BY MOUTH EVERY DAY  Dispense: 90 capsule; Refill: 1    Return in about 3 months (around 10/08/2022) for ok to cancel June appointment and schedule for 3 months.   Marikay Alar, MD [facility]

## 2022-07-09 ENCOUNTER — Other Ambulatory Visit: Payer: Self-pay

## 2022-07-09 ENCOUNTER — Other Ambulatory Visit: Payer: Self-pay | Admitting: Family Medicine

## 2022-07-09 ENCOUNTER — Telehealth: Payer: Self-pay | Admitting: Family Medicine

## 2022-07-09 ENCOUNTER — Encounter: Payer: Self-pay | Admitting: Family Medicine

## 2022-07-09 DIAGNOSIS — D72829 Elevated white blood cell count, unspecified: Secondary | ICD-10-CM

## 2022-07-09 NOTE — Telephone Encounter (Signed)
Patient called and said that the office called. No note. Patient thought office was calling about her pharmacy.  Pharmacy is [facility], [address], [place].

## 2022-07-10 NOTE — Addendum Note (Signed)
Addended by: Birdie Sons, Meilin Brosh G on: 07/10/2022 11:40 AM   Modules accepted: Orders

## 2022-07-10 NOTE — Telephone Encounter (Signed)
Already spoke to Patient regarding her blood work and she stated she did not have any questions

## 2022-07-16 ENCOUNTER — Encounter: Payer: Self-pay | Admitting: Family Medicine

## 2022-07-17 ENCOUNTER — Telehealth: Payer: Self-pay | Admitting: Family Medicine

## 2022-07-17 NOTE — Telephone Encounter (Signed)
Please let the patient know that Dr Maryruth Bun got back with me regarding the hot and cold flashes. She noted it was possible that one of the medications could be contributing. She advised she would get in touch with the patient to discuss tapering down on one of the medications to see if that would help.

## 2022-07-20 NOTE — Telephone Encounter (Signed)
Left message to call the office back regarding Dr. Purvis Sheffield message about hot and cold flashes

## 2022-07-22 NOTE — Telephone Encounter (Signed)
Patient states she is returning a call from Prince Solian, CMA.  I read Dr. Bernardo Heater message to patient.  Patient states she is currently in [place] and would like for [facility] to please post Dr. Bernardo Heater message to her on MyChart, so she can access it there.

## 2022-07-22 NOTE — Telephone Encounter (Signed)
Please let the patient know that Dr Maryruth Bun got back with me regarding the hot and cold flashes. She noted it was possible that one of the medications could be contributing. She advised she would get in touch with the patient to discuss tapering down on one of the medications to see if that would help.

## 2022-07-24 ENCOUNTER — Ambulatory Visit: Payer: Medicare Other | Admitting: Family Medicine

## 2022-08-07 ENCOUNTER — Telehealth: Payer: Self-pay

## 2022-08-07 ENCOUNTER — Telehealth (INDEPENDENT_AMBULATORY_CARE_PROVIDER_SITE_OTHER): Payer: Medicare Other | Admitting: Family Medicine

## 2022-08-07 ENCOUNTER — Encounter: Payer: Self-pay | Admitting: Family Medicine

## 2022-08-07 DIAGNOSIS — U071 COVID-19: Secondary | ICD-10-CM | POA: Diagnosis not present

## 2022-08-07 MED ORDER — HYDROCODONE BIT-HOMATROP MBR 5-1.5 MG/5ML PO SOLN
5.0000 mL | Freq: Three times a day (TID) | ORAL | 0 refills | Status: DC | PRN
Start: 2022-08-07 — End: 2022-08-14

## 2022-08-07 MED ORDER — NIRMATRELVIR/RITONAVIR (PAXLOVID)TABLET
3.0000 | ORAL_TABLET | Freq: Two times a day (BID) | ORAL | 0 refills | Status: AC
Start: 2022-08-07 — End: 2022-08-12

## 2022-08-07 NOTE — Progress Notes (Signed)
Virtual Visit via video Note  This visit type was conducted due to national recommendations for restrictions regarding the COVID-19 pandemic (e.g. social distancing).  This format is felt to be most appropriate for this patient at this time.  All issues noted in this document were discussed and addressed.  No physical exam was performed (except for noted visual exam findings with Video Visits).   I connected with Nicole Washington today at  3:15 PM EDT by a video enabled telemedicine application or telephone and verified that I am speaking with the correct person using two identifiers. Location patient: home Location provider: work Persons participating in the virtual visit: patient, provider  I discussed the limitations, risks, security and privacy concerns of performing an evaluation and management service by telephone and the availability of in person appointments. I also discussed with the patient that there may be a patient responsible charge related to this service. The patient expressed understanding and agreed to proceed.  Reason for visit: COVID-19.  HPI: Patient developed symptoms yesterday.  She developed hoarseness and chills as well as some cough and sinus congestion.  No fevers.  No shortness of breath.  No sore throat.  Does have postnasal drip.  She not tried any medicines for this.  She just got back from [place] and suspects that is where she got COVID.   ROS: See pertinent positives and negatives per HPI.  Past Medical History:  Diagnosis Date   Actinic keratosis    Basal cell carcinoma    Legs, L forehead, txted in past in [place]   Burning mouth syndrome    CAD (coronary artery disease)    Cancer (HCC)    basal and squamous cell   Depression    GERD (gastroesophageal reflux disease)    Hyperlipidemia    Hypertension    Sleep apnea    CPAP   Squamous cell carcinoma of skin    R hand dorsum, L forehead, txted in [place]   Thyroid disease     Past Surgical  History:  Procedure Laterality Date   BREAST BIOPSY Right yrs ago   benign, marker placed   CATARACT EXTRACTION W/PHACO Left 02/23/2022   Procedure: CATARACT EXTRACTION PHACO AND INTRAOCULAR LENS PLACEMENT (IOC) LEFT  3.78  00:31.8;  Surgeon: [name], MD;  Location: [facility];  Service: Ophthalmology;  Laterality: Left;  Sleep apnea   CATARACT EXTRACTION W/PHACO Right 03/09/2022   Procedure: CATARACT EXTRACTION PHACO AND INTRAOCULAR LENS PLACEMENT (IOC) RIGHT  4.31  00:37.6;  Surgeon: [name], MD;  Location: [facility];  Service: Ophthalmology;  Laterality: Right;  Sleep apnea   CESAREAN SECTION      Family History  Problem Relation Age of Onset   Cerebral aneurysm Mother 81   COPD Father    Cancer Brother        ? colon   Lupus Other    Heart Problems Paternal Grandfather    Breast cancer Neg Hx     SOCIAL HX: Non-smoker   Current Outpatient Medications:    amitriptyline (ELAVIL) 10 MG tablet, amitriptyline 10 mg tablet  Take 1 tablet twice a day by oral route for 90 days., Disp: , Rfl:    amLODipine (NORVASC) 5 MG tablet, TAKE 1 TABLET BY MOUTH EVERY DAY, Disp: 90 tablet, Rfl: 1   atorvastatin (LIPITOR) 40 MG tablet, TAKE 1 TABLET BY MOUTH EVERY DAY, Disp: 90 tablet, Rfl: 3   carvedilol (COREG) 3.125 MG tablet, TAKE 1 TABLET  BY MOUTH TWICE A DAY WITH FOOD, Disp: 180 tablet, Rfl: 1   clonazePAM (KLONOPIN) 0.5 MG tablet, TAKE 2 TABLETS BY MOUTH DAILY AS NEEDED FOR ANXIETY, Disp: 60 tablet, Rfl: 0   DULoxetine (CYMBALTA) 60 MG capsule, Take 60 mg by mouth 2 (two) times daily., Disp: , Rfl:    HYDROcodone bit-homatropine (HYCODAN) 5-1.5 MG/5ML syrup, Take 5 mLs by mouth every 8 (eight) hours as needed for cough., Disp: 120 mL, Rfl: 0   linaclotide (LINZESS) 72 MCG capsule, Take 1 capsule (72 mcg total) by mouth daily before breakfast., Disp: 30 capsule, Rfl: 1   losartan (COZAAR) 50 MG tablet, Take 1 tablet (50 mg total) by mouth daily., Disp: 90  tablet, Rfl: 3   nirmatrelvir/ritonavir (PAXLOVID) 20 x 150 MG & 10 x 100MG  TABS, Take 3 tablets by mouth 2 (two) times daily for 5 days. (Take nirmatrelvir 150 mg two tablets twice daily for 5 days and ritonavir 100 mg one tablet twice daily for 5 days) Patient GFR is 64, Disp: 30 tablet, Rfl: 0   omeprazole (PRILOSEC) 20 MG capsule, TAKE 1 CAPSULE BY MOUTH EVERY DAY, Disp: 90 capsule, Rfl: 1   pregabalin (LYRICA) 150 MG capsule, Take 150 mg by mouth 2 (two) times daily., Disp: , Rfl:    Semaglutide,0.25 or 0.5MG /DOS, 2 MG/3ML SOPN, Inject 0.25 mg into the skin once a week for 14 days, THEN 0.5 mg once a week., Disp: 3 mL, Rfl: 3   sertraline (ZOLOFT) 100 MG tablet, Take 100 mg by mouth daily., Disp: , Rfl:    traZODone (DESYREL) 100 MG tablet, Take 100 mg by mouth at bedtime., Disp: , Rfl:    valACYclovir (VALTREX) 1000 MG tablet, Take 1 tablet (1,000 mg total) by mouth as directed. At onset of fever blister symptoms take 2 tablets po then 12 hours later take 2 tablets po, Disp: 30 tablet, Rfl: 6  EXAM:  VITALS per patient if applicable:  GENERAL: alert, oriented, appears well and in no acute distress  HEENT: atraumatic, conjunttiva clear, no obvious abnormalities on inspection of external nose and ears  NECK: normal movements of the head and neck  LUNGS: on inspection no signs of respiratory distress, breathing rate appears normal, no obvious gross SOB, gasping or wheezing  CV: no obvious cyanosis  MS: moves all visible extremities without noticeable abnormality  PSYCH/NEURO: pleasant and cooperative, no obvious depression or anxiety, speech and thought processing grossly intact  ASSESSMENT AND PLAN:  Discussed the following assessment and plan:  Problem List Items Addressed This Visit     COVID-19 - Primary    Patient with COVID-19.  Positive test at home.  We will treat with Paxlovid.  I advised her to hold her statin while she is on Paxlovid and to resume it 2 to 3 days after  coming off of Paxlovid.  Discussed monitoring her blood pressure given risk of elevated blood pressure on Paxlovid and given risk of interaction with amlodipine.  If she has low blood pressure or high blood pressure she will let us know.  Discussed risk of diarrhea and abnormal taste related to Paxlovid use.  Will also send in Hycodan for her to have on hand for cough as needed.  She has taken this in the past with good benefit.  Advised to seek medical attention for shortness of breath or cough productive of blood.  Discussed the purpose of Paxlovid is to reduce risk of progression to severe illness, hospitalization, or death.  Discussed she  may start to feel better on the Paxlovid though she may not.  Patient was advised to stay home until she starts to feel better and has been without fevers or chills for 24 hours.  She was advised to wear a mask if she has to leave the home.      Relevant Medications   nirmatrelvir/ritonavir (PAXLOVID) 20 x 150 MG & 10 x 100MG  TABS   HYDROcodone bit-homatropine (HYCODAN) 5-1.5 MG/5ML syrup    Return if symptoms worsen or fail to improve.   I discussed the assessment and treatment plan with the patient. The patient was provided an opportunity to ask questions and all were answered. The patient agreed with the plan and demonstrated an understanding of the instructions.   The patient was advised to call back or seek an in-person evaluation if the symptoms worsen or if the condition fails to improve as anticipated.  Marikay Alar, MD

## 2022-08-07 NOTE — Telephone Encounter (Signed)
Sent Patient a direct link to the video visit and called to help her get ready for her visit no answer so I left a message.

## 2022-08-07 NOTE — Assessment & Plan Note (Addendum)
Patient with COVID-19.  Positive test at home.  We will treat with Paxlovid.  I advised her to hold her statin while she is on Paxlovid and to resume it 2 to 3 days after coming off of Paxlovid.  Discussed monitoring her blood pressure given risk of elevated blood pressure on Paxlovid and given risk of interaction with amlodipine.  If she has low blood pressure or high blood pressure she will let us know.  Discussed risk of diarrhea and abnormal taste related to Paxlovid use.  Will also send in Hycodan for her to have on hand for cough as needed.  She has taken this in the past with good benefit.  Advised to seek medical attention for shortness of breath or cough productive of blood.  Discussed the purpose of Paxlovid is to reduce risk of progression to severe illness, hospitalization, or death.  Discussed she may start to feel better on the Paxlovid though she may not.  Patient was advised to stay home until she starts to feel better and has been without fevers or chills for 24 hours.  She was advised to wear a mask if she has to leave the home.

## 2022-08-08 ENCOUNTER — Other Ambulatory Visit: Payer: Self-pay | Admitting: Family Medicine

## 2022-08-12 ENCOUNTER — Telehealth: Payer: Self-pay | Admitting: Family Medicine

## 2022-08-12 NOTE — Telephone Encounter (Signed)
Pt called in stating that she has pink eye and Covid and was wondering if Dr. Birdie Sons can [facility]?

## 2022-08-13 ENCOUNTER — Telehealth (INDEPENDENT_AMBULATORY_CARE_PROVIDER_SITE_OTHER): Payer: Medicare Other | Admitting: Nurse Practitioner

## 2022-08-13 ENCOUNTER — Other Ambulatory Visit: Payer: Medicare Other

## 2022-08-13 DIAGNOSIS — H109 Unspecified conjunctivitis: Secondary | ICD-10-CM

## 2022-08-13 DIAGNOSIS — J329 Chronic sinusitis, unspecified: Secondary | ICD-10-CM | POA: Diagnosis not present

## 2022-08-13 MED ORDER — PREDNISONE 20 MG PO TABS: 20.0000 mg | ORAL_TABLET | Freq: Every day | ORAL | 0 refills | Status: AC

## 2022-08-13 MED ORDER — MOXIFLOXACIN HCL 0.5 % OP SOLN: 1.0000 [drp] | Freq: Three times a day (TID) | OPHTHALMIC | 0 refills | Status: AC

## 2022-08-13 MED ORDER — AMOXICILLIN-POT CLAVULANATE 875-125 MG PO TABS: 1.0000 | ORAL_TABLET | Freq: Two times a day (BID) | ORAL | 0 refills | Status: DC

## 2022-08-13 MED ORDER — FLUTICASONE PROPIONATE 50 MCG/ACT NA SUSP: 2.0000 | Freq: Every day | NASAL | 6 refills | Status: DC

## 2022-08-13 NOTE — Patient Instructions (Addendum)
Plan Augmentin, prednisone, Flonase spray  and eye drops sent to pharmacy. Take OTC plain mucinex tablets for congestion. If have SOB, chest pain seek emergent care.

## 2022-08-13 NOTE — Telephone Encounter (Signed)
Patient had a virtual visit with [name], NP on 08/13/22.

## 2022-08-13 NOTE — Progress Notes (Signed)
Virtual Visit via Video Note  I connected with Nicole Washington on 08/13/22 at 2:44 PM  by a video enabled telemedicine application and verified that I am speaking with the correct person using two identifiers.  Patient Location: Home Provider Location: Office/Clinic  I discussed the limitations, risks, security, and privacy concerns of performing an evaluation and management service by video and the availability of in person appointments. I also discussed with the patient that there may be a patient responsible charge related to this service. The patient expressed understanding and agreed to proceed.  Subjective: PCP: [name], MD  Chief Complaint  Patient presents with   Sinus Problem   pink eyes   HPI  She was tested positive for COVID was started on Paxlovid on 08/07/2022.  She completed the course of Paxlovid.  She reports postnasal drip, frontal sinus pressure, nasal congestion and cough with productive sputum.  Denies fever, shortness of breath or chest pain.  She states that she has pinkeye bilaterally started today.  She denies any discharge from the eye.  Denies itching, visual disturbances.   ROS: Per HPI  Current Outpatient Medications:    amoxicillin-clavulanate (AUGMENTIN) 875-125 MG tablet, Take 1 tablet by mouth 2 (two) times daily., Disp: 20 tablet, Rfl: 0   fluticasone (FLONASE) 50 MCG/ACT nasal spray, Place 2 sprays into both nostrils daily., Disp: 16 g, Rfl: 6   amitriptyline (ELAVIL) 10 MG tablet, amitriptyline 10 mg tablet  Take 1 tablet twice a day by oral route for 90 days., Disp: , Rfl:    amLODipine (NORVASC) 5 MG tablet, TAKE 1 TABLET BY MOUTH EVERY DAY, Disp: 90 tablet, Rfl: 1   atorvastatin (LIPITOR) 40 MG tablet, TAKE 1 TABLET BY MOUTH EVERY DAY, Disp: 90 tablet, Rfl: 3   carvedilol (COREG) 3.125 MG tablet, TAKE 1 TABLET BY MOUTH TWICE A DAY WITH FOOD, Disp: 180 tablet, Rfl: 1   clonazePAM (KLONOPIN) 0.5 MG tablet, TAKE 2 TABLETS BY MOUTH DAILY  AS NEEDED FOR ANXIETY, Disp: 60 tablet, Rfl: 0   DULoxetine (CYMBALTA) 60 MG capsule, Take 60 mg by mouth 2 (two) times daily., Disp: , Rfl:    HYDROcodone bit-homatropine (HYCODAN) 5-1.5 MG/5ML syrup, Take 5 mLs by mouth every 8 (eight) hours as needed for cough., Disp: 120 mL, Rfl: 0   linaclotide (LINZESS) 72 MCG capsule, Take 1 capsule (72 mcg total) by mouth daily before breakfast., Disp: 30 capsule, Rfl: 1   losartan (COZAAR) 50 MG tablet, Take 1 tablet (50 mg total) by mouth daily., Disp: 90 tablet, Rfl: 3   omeprazole (PRILOSEC) 20 MG capsule, TAKE 1 CAPSULE BY MOUTH EVERY DAY, Disp: 90 capsule, Rfl: 1   pregabalin (LYRICA) 150 MG capsule, Take 150 mg by mouth 2 (two) times daily., Disp: , Rfl:    Semaglutide,0.25 or 0.5MG /DOS, 2 MG/3ML SOPN, Inject 0.25 mg into the skin once a week for 14 days, THEN 0.5 mg once a week., Disp: 3 mL, Rfl: 3   sertraline (ZOLOFT) 100 MG tablet, Take 100 mg by mouth daily., Disp: , Rfl:    traZODone (DESYREL) 100 MG tablet, Take 100 mg by mouth at bedtime., Disp: , Rfl:    valACYclovir (VALTREX) 1000 MG tablet, Take 1 tablet (1,000 mg total) by mouth as directed. At onset of fever blister symptoms take 2 tablets po then 12 hours later take 2 tablets po, Disp: 30 tablet, Rfl: 6  Observations/Objective: There were no vitals filed for this visit. Physical Exam  Assessment and  Plan: Sinusitis, unspecified chronicity, unspecified location Assessment & Plan: Will treat with Augmentin, prednisone tapering and Flonase nasal spray. Advised patient to increase fluid intake and rest. Encouraged to use steam or humidifier. Advised to take OTC  plain Mucinex for congestion.   Conjunctivitis of both eyes, unspecified conjunctivitis type Assessment & Plan: Will treat pinkeye with Vigamox eyedrop 1 to 2 drops bilaterally for 5 days.    Other orders -     Amoxicillin-Pot Clavulanate; Take 1 tablet by mouth 2 (two) times daily.  Dispense: 20 tablet; Refill: 0 -      predniSONE; Take 1 tablet (20 mg total) by mouth daily with breakfast for 5 days.  Dispense: 5 tablet; Refill: 0 -     Moxifloxacin HCl; Place 1 drop into both eyes 3 (three) times daily for 5 days.  Dispense: 3 mL; Refill: 0 -     Fluticasone Propionate; Place 2 sprays into both nostrils daily.  Dispense: 16 g; Refill: 6    Follow Up Instructions: Return if symptoms worsen or fail to improve.   I discussed the assessment and treatment plan with the patient. The patient was provided an opportunity to ask questions, and all were answered. The patient agreed with the plan and demonstrated an understanding of the instructions.   The patient was advised to call back or seek an in-person evaluation if the symptoms worsen or if the condition fails to improve as anticipated.  The above assessment and management plan was discussed with the patient. The patient verbalized understanding of and has agreed to the management plan.   [name], NP

## 2022-08-13 NOTE — Telephone Encounter (Signed)
Noted. Patient evaluated today for this by another provider.

## 2022-08-14 ENCOUNTER — Telehealth: Payer: Self-pay

## 2022-08-14 ENCOUNTER — Ambulatory Visit: Payer: Medicare Other | Admitting: Family Medicine

## 2022-08-14 DIAGNOSIS — U071 COVID-19: Secondary | ICD-10-CM

## 2022-08-14 MED ORDER — HYDROCODONE BIT-HOMATROP MBR 5-1.5 MG/5ML PO SOLN
5.0000 mL | Freq: Three times a day (TID) | ORAL | 0 refills | Status: DC | PRN
Start: 2022-08-14 — End: 2022-09-07

## 2022-08-14 NOTE — Telephone Encounter (Signed)
Called Patient to let her know the medication was called in and if she gets excessively drowsy while taking this medication to discontinue and let Dr. Birdie Sons know. Patient is agreeable.

## 2022-08-14 NOTE — Telephone Encounter (Signed)
Nicole Washington had a virtual visit with the Patient on 08/13/22 and now the Patient is requesting cough medication. Charanpreet Kaur called in Flonase, prednisone and vigamox eye drops.

## 2022-08-14 NOTE — Telephone Encounter (Signed)
Noted.  Hycodan refilled.  She needs to monitor for drowsiness and if she is excessively drowsy while taking this she needs to discontinue use of it and let us know.

## 2022-08-18 ENCOUNTER — Telehealth: Payer: Self-pay | Admitting: Family Medicine

## 2022-08-18 NOTE — Telephone Encounter (Signed)
Copied from CRM [id number]. Topic: Medicare AWV >> Aug 18, 2022  1:50 PM Payton Doughty wrote: Reason for CRM: LM 08/18/2022 to schedule AWV   Verlee Rossetti; Care Guide Ambulatory Clinical Support [facility] Direct Dial: [phone]

## 2022-08-23 ENCOUNTER — Encounter: Payer: Self-pay | Admitting: Nurse Practitioner

## 2022-08-23 DIAGNOSIS — J329 Chronic sinusitis, unspecified: Secondary | ICD-10-CM | POA: Insufficient documentation

## 2022-08-23 DIAGNOSIS — H109 Unspecified conjunctivitis: Secondary | ICD-10-CM | POA: Insufficient documentation

## 2022-08-23 NOTE — Assessment & Plan Note (Addendum)
Will treat with Augmentin, prednisone tapering and Flonase nasal spray. Advised patient to increase fluid intake and rest. Encouraged to use steam or humidifier. Advised to take OTC  plain Mucinex for congestion.

## 2022-08-23 NOTE — Assessment & Plan Note (Signed)
Will treat pinkeye with Vigamox eyedrop 1 to 2 drops bilaterally for 5 days.

## 2022-08-26 ENCOUNTER — Encounter: Payer: Self-pay | Admitting: Gastroenterology

## 2022-08-27 ENCOUNTER — Ambulatory Visit: Payer: Medicare Other | Admitting: Anesthesiology

## 2022-08-27 ENCOUNTER — Ambulatory Visit
Admission: RE | Admit: 2022-08-27 | Discharge: 2022-08-27 | Disposition: A | Discharge status: Home / Self Care | Payer: Medicare Other | Source: Ambulatory Visit | Attending: Gastroenterology | Admitting: Gastroenterology

## 2022-08-27 ENCOUNTER — Other Ambulatory Visit: Payer: Medicare Other

## 2022-08-27 ENCOUNTER — Encounter: Payer: Self-pay | Admitting: Gastroenterology

## 2022-08-27 ENCOUNTER — Other Ambulatory Visit: Payer: Self-pay | Admitting: Family Medicine

## 2022-08-27 ENCOUNTER — Encounter
Admission: RE | Disposition: A | Discharge status: Home / Self Care | Payer: Self-pay | Source: Ambulatory Visit | Attending: Gastroenterology

## 2022-08-27 DIAGNOSIS — E669 Obesity, unspecified: Secondary | ICD-10-CM | POA: Diagnosis not present

## 2022-08-27 DIAGNOSIS — K640 First degree hemorrhoids: Secondary | ICD-10-CM | POA: Insufficient documentation

## 2022-08-27 DIAGNOSIS — M797 Fibromyalgia: Secondary | ICD-10-CM | POA: Insufficient documentation

## 2022-08-27 DIAGNOSIS — D122 Benign neoplasm of ascending colon: Secondary | ICD-10-CM | POA: Diagnosis not present

## 2022-08-27 DIAGNOSIS — F418 Other specified anxiety disorders: Secondary | ICD-10-CM | POA: Insufficient documentation

## 2022-08-27 DIAGNOSIS — Z6829 Body mass index (BMI) 29.0-29.9, adult: Secondary | ICD-10-CM | POA: Diagnosis not present

## 2022-08-27 DIAGNOSIS — R7303 Prediabetes: Secondary | ICD-10-CM | POA: Insufficient documentation

## 2022-08-27 DIAGNOSIS — D126 Benign neoplasm of colon, unspecified: Secondary | ICD-10-CM | POA: Diagnosis not present

## 2022-08-27 DIAGNOSIS — G4730 Sleep apnea, unspecified: Secondary | ICD-10-CM | POA: Insufficient documentation

## 2022-08-27 DIAGNOSIS — K635 Polyp of colon: Secondary | ICD-10-CM | POA: Diagnosis not present

## 2022-08-27 DIAGNOSIS — I10 Essential (primary) hypertension: Secondary | ICD-10-CM | POA: Diagnosis not present

## 2022-08-27 DIAGNOSIS — K581 Irritable bowel syndrome with constipation: Secondary | ICD-10-CM

## 2022-08-27 DIAGNOSIS — K219 Gastro-esophageal reflux disease without esophagitis: Secondary | ICD-10-CM | POA: Diagnosis not present

## 2022-08-27 DIAGNOSIS — K625 Hemorrhage of anus and rectum: Secondary | ICD-10-CM

## 2022-08-27 DIAGNOSIS — K589 Irritable bowel syndrome without diarrhea: Secondary | ICD-10-CM | POA: Diagnosis not present

## 2022-08-27 DIAGNOSIS — I2510 Atherosclerotic heart disease of native coronary artery without angina pectoris: Secondary | ICD-10-CM | POA: Insufficient documentation

## 2022-08-27 HISTORY — DX: Benign neoplasm of colon, unspecified: D12.6

## 2022-08-27 HISTORY — DX: Prediabetes: R73.03

## 2022-08-27 HISTORY — PX: COLONOSCOPY WITH PROPOFOL: SHX5780

## 2022-08-27 HISTORY — PX: POLYPECTOMY: SHX5525

## 2022-08-27 LAB — GLUCOSE, CAPILLARY: Glucose-Capillary: 100 mg/dL — ABNORMAL HIGH (ref 70–99)

## 2022-08-27 SURGERY — COLONOSCOPY WITH PROPOFOL
Anesthesia: General

## 2022-08-27 MED ORDER — SODIUM CHLORIDE 0.9 % IV SOLN: INTRAVENOUS | Status: DC

## 2022-08-27 MED ORDER — PROPOFOL 10 MG/ML IV BOLUS
INTRAVENOUS | Status: AC
  Filled 2022-08-27: qty 40

## 2022-08-27 MED ORDER — LIDOCAINE HCL (CARDIAC) PF 100 MG/5ML IV SOSY
PREFILLED_SYRINGE | INTRAVENOUS | Status: DC | PRN
  Administered 2022-08-27: 100 mg via INTRAVENOUS

## 2022-08-27 MED ORDER — PROPOFOL 10 MG/ML IV BOLUS
INTRAVENOUS | Status: DC | PRN
Start: 2022-08-27 — End: 2022-08-27
  Administered 2022-08-27: 140 ug/kg/min via INTRAVENOUS
  Administered 2022-08-27: 100 mg via INTRAVENOUS

## 2022-08-27 MED ORDER — LIDOCAINE HCL (PF) 2 % IJ SOLN
INTRAMUSCULAR | Status: AC
  Filled 2022-08-27: qty 5

## 2022-08-27 NOTE — Anesthesia Postprocedure Evaluation (Signed)
Anesthesia Post Note  Patient: Nicole Washington  Procedure(s) Performed: COLONOSCOPY WITH PROPOFOL  Patient location during evaluation: PACU Anesthesia Type: General Level of consciousness: awake and alert Pain management: pain level controlled Vital Signs Assessment: post-procedure vital signs reviewed and stable Respiratory status: spontaneous breathing, nonlabored ventilation and respiratory function stable Cardiovascular status: blood pressure returned to baseline and stable Postop Assessment: no apparent nausea or vomiting Anesthetic complications: no   No notable events documented.   Last Vitals:  Vitals:   08/27/22 0924 08/27/22 0934  BP: 112/69 113/68  Pulse: 85 83  Resp: (!) 22 15  Temp:    SpO2: 95% 97%    Last Pain:  Vitals:   08/27/22 0934  TempSrc:   PainSc: 0-No pain                 Foye Deer

## 2022-08-27 NOTE — Transfer of Care (Signed)
Immediate Anesthesia Transfer of Care Note  Patient: Nicole Washington  Procedure(s) Performed: COLONOSCOPY WITH PROPOFOL  Patient Location: Endoscopy Unit  Anesthesia Type:General  Level of Consciousness: drowsy  Airway & Oxygen Therapy: Patient Spontanous Breathing  Post-op Assessment: Report given to RN and Post -op Vital signs reviewed and stable  Post vital signs: Reviewed and stable  Last Vitals:  Vitals Value Taken Time  BP 119/74 08/27/22 0914  Temp 35.7 0914  Pulse 88 08/27/22 0914  Resp 15 08/27/22 0914  SpO2 93 % 08/27/22 0914  Vitals shown include unfiled device data.  Last Pain:  Vitals:   08/27/22 0758  TempSrc: Temporal         Complications: No notable events documented.

## 2022-08-27 NOTE — H&P (Signed)
[name], MD [address], [address], [place], [place], [address] [address], [address], [place], [place], [address] Phone: [phone]  Fax: [phone]  Primary Care Physician:  [name], MD   Pre-Procedure History & Physical: HPI:  Nicole Washington is a 72 y.o. female is here for an colonoscopy.   Past Medical History:  Diagnosis Date   Actinic keratosis    Basal cell carcinoma    Legs, L forehead, txted in past in [place]   Burning mouth syndrome    CAD (coronary artery disease)    Cancer (HCC)    basal and squamous cell   Depression    GERD (gastroesophageal reflux disease)    Hyperlipidemia    Hypertension    Pre-diabetes    Sleep apnea    CPAP   Squamous cell carcinoma of skin    R hand dorsum, L forehead, txted in [place]   Thyroid disease     Past Surgical History:  Procedure Laterality Date   BREAST BIOPSY Right yrs ago   benign, marker placed   CATARACT EXTRACTION W/PHACO Left 02/23/2022   Procedure: CATARACT EXTRACTION PHACO AND INTRAOCULAR LENS PLACEMENT (IOC) LEFT  3.78  00:31.8;  Surgeon: [name], MD;  Location: [facility];  Service: Ophthalmology;  Laterality: Left;  Sleep apnea   CATARACT EXTRACTION W/PHACO Right 03/09/2022   Procedure: CATARACT EXTRACTION PHACO AND INTRAOCULAR LENS PLACEMENT (IOC) RIGHT  4.31  00:37.6;  Surgeon: [name], MD;  Location: [facility];  Service: Ophthalmology;  Laterality: Right;  Sleep apnea   CESAREAN SECTION      Prior to Admission medications   Medication Sig Start Date End Date Taking? Authorizing Provider  amitriptyline (ELAVIL) 10 MG tablet amitriptyline 10 mg tablet  Take 1 tablet twice a day by oral route for 90 days.   Yes [provider]  amLODipine (NORVASC) 5 MG tablet TAKE 1 TABLET BY MOUTH EVERY DAY 06/05/22  Yes [name], MD  carvedilol (COREG) 3.125 MG tablet TAKE 1 TABLET BY MOUTH TWICE A DAY WITH FOOD 08/10/22  Yes [name], MD  clonazePAM (KLONOPIN) 0.5 MG tablet TAKE 2 TABLETS BY MOUTH DAILY AS NEEDED FOR ANXIETY 07/13/22  Yes [name], MD  DULoxetine (CYMBALTA) 60 MG capsule Take 60 mg by mouth 2 (two) times daily.   Yes [provider]  omeprazole (PRILOSEC) 20 MG capsule TAKE 1 CAPSULE BY MOUTH EVERY DAY 07/08/22  Yes [name], MD  pregabalin (LYRICA) 150 MG capsule Take 150 mg by mouth 2 (two) times daily. 07/27/19  Yes [provider]  sertraline (ZOLOFT) 100 MG tablet Take 100 mg by mouth daily. 04/20/18  Yes [provider]  amoxicillin-clavulanate (AUGMENTIN) 875-125 MG tablet Take 1 tablet by mouth 2 (two) times daily. 08/13/22   [name], NP  atorvastatin (LIPITOR) 40 MG tablet TAKE 1 TABLET BY MOUTH EVERY DAY 05/28/22   [name], MD  fluticasone [facility]) 50 MCG/ACT nasal spray Place 2 sprays into both nostrils daily. 08/13/22   [name], NP  HYDROcodone bit-homatropine (HYCODAN) 5-1.5 MG/5ML syrup Take 5 mLs by mouth every 8 (eight) hours as needed for cough. Patient not taking: Reported on 08/27/2022 08/14/22   [name], MD  linaclotide [facility]) 72 MCG capsule Take 1 capsule (72 mcg total) by mouth daily before breakfast. 07/07/22   [name], PA-C  losartan (COZAAR) 50 MG tablet Take 1 tablet (50 mg  total) by mouth daily. 07/08/22   [name], MD  Semaglutide,0.25 or 0.5MG /DOS, 2 MG/3ML SOPN Inject 0.25 mg into the skin once a week for 14 days, THEN 0.5 mg once a week. 05/18/22 08/30/22  [name], MD  traZODone (DESYREL) 100 MG tablet Take 100 mg by mouth at bedtime.    [provider]  valACYclovir (VALTREX) 1000 MG tablet Take 1 tablet (1,000 mg total) by mouth as directed. At onset of fever blister symptoms take 2 tablets po then 12 hours later take 2 tablets po 04/06/22   [name], MD    Allergies as of 06/25/2022 - Review Complete 06/25/2022  Allergen Reaction Noted   Codeine Itching  10/27/2016   Ivp dye [iodinated contrast media] Itching 02/05/2022   Pneumovax 23 [pneumococcal vac polyvalent] Swelling 09/19/2019    Family History  Problem Relation Age of Onset   Cerebral aneurysm Mother 65   COPD Father    Cancer Brother        ? colon   Lupus Other    Heart Problems Paternal Grandfather    Breast cancer Neg Hx     Social History   Socioeconomic History   Marital status: Married    Spouse name: Not on file   Number of children: Not on file   Years of education: Not on file   Highest education level: Not on file  Occupational History   Not on file  Tobacco Use   Smoking status: Former    Current packs/day: 0.00    Types: Cigarettes    Quit date: 12/20/1989    Years since quitting: 32.7   Smokeless tobacco: Never  Vaping Use   Vaping status: Never Used  Substance and Sexual Activity   Alcohol use: Yes    Comment: 4 times a year   Drug use: No   Sexual activity: Not on file  Other Topics Concern   Not on file  Social History Narrative   Not on file   Social Determinants of Health   Financial Resource Strain: Low Risk  (07/01/2022)   Overall Financial Resource Strain (CARDIA)    Difficulty of Paying Living Expenses: Not hard at all  Food Insecurity: No Food Insecurity (07/01/2022)   Hunger Vital Sign    Worried About Running Out of Food in the Last Year: Never true    Ran Out of Food in the Last Year: Never true  Transportation Needs: No Transportation Needs (07/01/2022)   PRAPARE - Administrator, Civil Service (Medical): No    Lack of Transportation (Non-Medical): No  Physical Activity: Unknown (07/01/2022)   Exercise Vital Sign    Days of Exercise per Week: Patient declined    Minutes of Exercise per Session: Not on file  Stress: No Stress Concern Present (07/01/2022)   [employer] of Occupational Health - Occupational Stress Questionnaire    Feeling of Stress : Only a little  Social Connections: Unknown (07/01/2022)    Social Connection and Isolation Panel [NHANES]    Frequency of Communication with Friends and Family: Three times a week    Frequency of Social Gatherings with Friends and Family: Once a week    Attends Religious Services: Patient declined    Active Member of Clubs or Organizations: No    Attends Banker Meetings: Not on file    Marital Status: Married  Intimate Partner Violence: Not At Risk (07/17/2020)   Humiliation, Afraid, Rape, and Kick questionnaire  Fear of Current or Ex-Partner: No    Emotionally Abused: No    Physically Abused: No    Sexually Abused: No    Review of Systems: See HPI, otherwise negative ROS  Physical Exam: There were no vitals taken for this visit. General:   Alert,  pleasant and cooperative in NAD Head:  Normocephalic and atraumatic. Neck:  Supple; no masses or thyromegaly. Lungs:  Clear throughout to auscultation, normal respiratory effort.    Heart:  +S1, +S2, Regular rate and rhythm, No edema. Abdomen:  Soft, nontender and nondistended. Normal bowel sounds, without guarding, and without rebound.   Neurologic:  Alert and  oriented x4;  grossly normal neurologically.  Impression/Plan: Nicole Washington is here for an colonoscopy to be performed for rectal bleeding. Risks, benefits, limitations, and alternatives regarding  colonoscopy have been reviewed with the patient.  Questions have been answered.  All parties agreeable.   [name], MD  08/27/2022, 8:05 AM

## 2022-08-27 NOTE — Op Note (Signed)
[facility] Patient Name: Nicole Washington Procedure Date: 08/27/2022 8:33 AM MRN: [id number] Account #: [id number] Date of Birth: [birth year] Admit Type: Outpatient Age: 72 Room: [facility] ENDO ROOM 4 Gender: Female Note Status: Finalized Instrument Name: Prentice Docker [id number] Procedure:             Colonoscopy Indications:           Rectal bleeding Providers:             [name] MD, MD Referring MD:          Yehuda Mao. Birdie Sons (Referring MD) Medicines:             Monitored Anesthesia Care Complications:         No immediate complications. Procedure:             Pre-Anesthesia Assessment:                        - Prior to the procedure, a History and Physical was                         performed, and patient medications, allergies and                         sensitivities were reviewed. The patient's tolerance                         of previous anesthesia was reviewed.                        - The risks and benefits of the procedure and the                         sedation options and risks were discussed with the                         patient. All questions were answered and informed                         consent was obtained.                        - ASA Grade Assessment: II - A patient with mild                         systemic disease.                        After obtaining informed consent, the colonoscope was                         passed under direct vision. Throughout the procedure,                         the patient's blood pressure, pulse, and oxygen                         saturations were monitored continuously. The                         Colonoscope was introduced through the anus  and                         advanced to the the cecum, identified by the                         appendiceal orifice. The colonoscopy was performed                         with ease. The patient tolerated the procedure well.                         The  quality of the bowel preparation was good. The                         ileocecal valve, appendiceal orifice, and rectum were                         photographed. Findings:      The perianal and digital rectal examinations were normal.      A 10 mm polyp was found in the ascending colon. The polyp was sessile.       The polyp was removed with a cold snare. Resection and retrieval were       complete. To prevent bleeding after the polypectomy, one hemostatic clip       was successfully placed. There was no bleeding at the end of the       procedure.      The exam was otherwise without abnormality on direct and retroflexion       views.      Non-bleeding internal hemorrhoids were found during retroflexion. The       hemorrhoids were medium-sized and Grade I (internal hemorrhoids that do       not prolapse). Impression:            - One 10 mm polyp in the ascending colon, removed with                         a cold snare. Resected and retrieved. Clip was placed.                        - The examination was otherwise normal on direct and                         retroflexion views. Recommendation:        - Discharge patient to home (with escort).                        - Resume previous diet.                        - Continue present medications.                        - Await pathology results.                        - Repeat colonoscopy is not recommended due to current  age (64 years or older) for surveillance based on                         pathology results. Procedure Code(s):     --- Professional ---                        [phone], Colonoscopy, flexible; with removal of                         tumor(s), polyp(s), or other lesion(s) by snare                         technique Diagnosis Code(s):     --- Professional ---                        D12.2, Benign neoplasm of ascending colon                        K62.5, Hemorrhage of anus and rectum CPT copyright 2022  American Medical Association. All rights reserved. The codes documented in this report are preliminary and upon coder review may  be revised to meet current compliance requirements. [name], MD [name] MD, MD 08/27/2022 9:13:56 AM This report has been signed electronically. Number of Addenda: 0 Note Initiated On: 08/27/2022 8:33 AM Scope Withdrawal Time: 0 hours 13 minutes 42 seconds  Total Procedure Duration: 0 hours 26 minutes 20 seconds  Estimated Blood Loss:  Estimated blood loss: none.      [facility]

## 2022-08-27 NOTE — Anesthesia Preprocedure Evaluation (Signed)
Anesthesia Evaluation  Patient identified by MRN, date of birth, ID band Patient awake    Reviewed: Allergy & Precautions, NPO status , Patient's Chart, lab work & pertinent test results  History of Anesthesia Complications Negative for: history of anesthetic complications  Airway Mallampati: III   Neck ROM: Full    Dental no notable dental hx.    Pulmonary sleep apnea and Continuous Positive Airway Pressure Ventilation , former smoker   Pulmonary exam normal breath sounds clear to auscultation       Cardiovascular hypertension, Pt. on home beta blockers and Pt. on medications (-) angina + CAD  (-) DOE Normal cardiovascular exam Rhythm:Regular Rate:Normal     Neuro/Psych  PSYCHIATRIC DISORDERS (PTSD) Anxiety Depression    negative neurological ROS     GI/Hepatic ,GERD  Controlled and Medicated,,  Endo/Other  Hypothyroidism  Obesity; prediabetes  Renal/GU negative Renal ROS     Musculoskeletal  (+)  Fibromyalgia -  Abdominal   Peds  Hematology negative hematology ROS (+)   Anesthesia Other Findings   Reproductive/Obstetrics                              Anesthesia Physical Anesthesia Plan  ASA: 2  Anesthesia Plan: General   Post-op Pain Management:    Induction: Intravenous  PONV Risk Score and Plan: 2 and Treatment may vary due to age or medical condition and TIVA  Airway Management Planned: Natural Airway and Nasal Cannula  Additional Equipment:   Intra-op Plan:   Post-operative Plan:   Informed Consent: I have reviewed the patients History and Physical, chart, labs and discussed the procedure including the risks, benefits and alternatives for the proposed anesthesia with the patient or authorized representative who has indicated his/her understanding and acceptance.     Dental advisory given  Plan Discussed with: CRNA  Anesthesia Plan Comments:           Anesthesia Quick Evaluation

## 2022-08-28 ENCOUNTER — Other Ambulatory Visit: Payer: Medicare Other

## 2022-09-02 ENCOUNTER — Encounter: Payer: Self-pay | Admitting: Gastroenterology

## 2022-09-07 ENCOUNTER — Other Ambulatory Visit: Payer: Self-pay

## 2022-09-07 NOTE — Progress Notes (Unsigned)
[name], PA-C [address]  [address]  [place], [place] [address]  Main: [phone]  Fax: [phone]   Primary Care Physician: [name], MD  Primary Gastroenterologist:  [name], PA-C / Dr. [name]    CC: F/U IBS-C and Rectal bleeding  HPI: Nicole Washington is a 72 y.o. female returns for 2 month f/u IBS-C and Rectal Bleeding.  She was given samples of Linzess to try at her last OV.  Colonoscopy 08/27/22 by Dr. Tobi Bastos showed a 10mm Sessile Serrated polyp (no dysplasia) removed from ascending colon.  Good prep.  Medium Internal Hemorrhoids.  Repeat Colonoscopy not recommended.  Colonoscopy done in [place] 09/2013 was normal.  No family history of colon cancer.  Current Outpatient Medications  Medication Sig Dispense Refill   amitriptyline (ELAVIL) 10 MG tablet amitriptyline 10 mg tablet  Take 1 tablet twice a day by oral route for 90 days.     amLODipine (NORVASC) 5 MG tablet TAKE 1 TABLET BY MOUTH EVERY DAY 90 tablet 1   atorvastatin (LIPITOR) 40 MG tablet TAKE 1 TABLET BY MOUTH EVERY DAY 90 tablet 3   carvedilol (COREG) 3.125 MG tablet TAKE 1 TABLET BY MOUTH TWICE A DAY WITH FOOD 180 tablet 1   clonazePAM (KLONOPIN) 0.5 MG tablet TAKE 2 TABLETS BY MOUTH DAILY AS NEEDED FOR ANXIETY 60 tablet 0   DULoxetine (CYMBALTA) 60 MG capsule Take 60 mg by mouth 2 (two) times daily.     linaclotide (LINZESS) 72 MCG capsule Take 1 capsule (72 mcg total) by mouth daily before breakfast. 30 capsule 1   losartan (COZAAR) 50 MG tablet Take 1 tablet (50 mg total) by mouth daily. 90 tablet 3   omeprazole (PRILOSEC) 20 MG capsule TAKE 1 CAPSULE BY MOUTH EVERY DAY 90 capsule 1   pregabalin (LYRICA) 150 MG capsule Take 150 mg by mouth 2 (two) times daily.     sertraline (ZOLOFT) 100 MG tablet Take 100 mg by mouth daily.     traZODone (DESYREL) 100 MG tablet Take 100 mg by mouth at bedtime.     valACYclovir (VALTREX) 1000 MG tablet Take 1 tablet (1,000 mg total) by mouth as  directed. At onset of fever blister symptoms take 2 tablets po then 12 hours later take 2 tablets po 30 tablet 6   No current facility-administered medications for this visit.    Allergies as of 09/08/2022 - Review Complete 08/27/2022  Allergen Reaction Noted   Codeine Itching 10/27/2016   Ivp dye [iodinated contrast media] Itching 02/05/2022   Pneumovax 23 [pneumococcal vac polyvalent] Swelling 09/19/2019    Past Medical History:  Diagnosis Date   Actinic keratosis    Basal cell carcinoma    Legs, L forehead, txted in past in [place]   Burning mouth syndrome    CAD (coronary artery disease)    Cancer (HCC)    basal and squamous cell   Depression    GERD (gastroesophageal reflux disease)    Hyperlipidemia    Hypertension    Pre-diabetes    Sleep apnea    CPAP   Squamous cell carcinoma of skin    R hand dorsum, L forehead, txted in [place]   Thyroid disease     Past Surgical History:  Procedure Laterality Date   BREAST BIOPSY Right yrs ago   benign, marker placed   CATARACT EXTRACTION W/PHACO Left 02/23/2022   Procedure: CATARACT EXTRACTION PHACO AND INTRAOCULAR LENS PLACEMENT (IOC) LEFT  3.78  00:31.8;  Surgeon: [name], MD;  Location: [facility];  Service: Ophthalmology;  Laterality: Left;  Sleep apnea   CATARACT EXTRACTION W/PHACO Right 03/09/2022   Procedure: CATARACT EXTRACTION PHACO AND INTRAOCULAR LENS PLACEMENT (IOC) RIGHT  4.31  00:37.6;  Surgeon: [name], MD;  Location: [facility];  Service: Ophthalmology;  Laterality: Right;  Sleep apnea   CESAREAN SECTION     COLONOSCOPY WITH PROPOFOL N/A 08/27/2022   Procedure: COLONOSCOPY WITH PROPOFOL;  Surgeon: [name], MD;  Location: [facility] END[facility];  Service: Gastroenterology;  Laterality: N/A;   POLYPECTOMY  08/27/2022   Procedure: POLYPECTOMY;  Surgeon: [name], MD;  Location: [facility] ENDOSCOPY;  Service: Gastroenterology;;    Review of Systems:    All systems reviewed and  negative except where noted in HPI.   Physical Examination:   There were no vitals taken for this visit.  General: Well-nourished, well-developed in no acute distress.  Eyes: No icterus. Conjunctivae pink. Mouth: Oropharyngeal mucosa moist and pink , no lesions erythema or exudate. Lungs: Clear to auscultation bilaterally. Non-labored. Heart: Regular rate and rhythm, no murmurs rubs or gallops.  Abdomen: Bowel sounds are normal; Abdomen is Soft; No hepatosplenomegaly, masses or hernias;  No Abdominal Tenderness; No guarding or rebound tenderness. Extremities: No lower extremity edema. No clubbing or deformities. Neuro: Alert and oriented x 3.  Grossly intact. Skin: Warm and dry, no jaundice.   Psych: Alert and cooperative, normal mood and affect.   Imaging Studies: No results found.  Assessment and Plan:   Nicole Washington is a 72 y.o. y/o female returns for f/u of Rectal Bleeding and IBS-C.  Colonoscopy 08/27/22 by Dr. Tobi Bastos showed a 10mm Sessile Serrated polyp (no dysplasia) removed from ascending colon.  Good prep.  Medium Internal Hemorrhoids.  Repeat Colonoscopy not recommended.  IBS-C History of Colon Polyps Internal Hemorrhoids   [name], PA-C  Follow up ***  BP check ***

## 2022-09-08 ENCOUNTER — Ambulatory Visit (INDEPENDENT_AMBULATORY_CARE_PROVIDER_SITE_OTHER): Payer: Medicare Other | Admitting: Family Medicine

## 2022-09-08 ENCOUNTER — Encounter: Payer: Self-pay | Admitting: Family Medicine

## 2022-09-08 ENCOUNTER — Inpatient Hospital Stay: Admit: 2022-09-08 | Payer: Medicare Other

## 2022-09-08 ENCOUNTER — Ambulatory Visit (INDEPENDENT_AMBULATORY_CARE_PROVIDER_SITE_OTHER): Payer: Medicare Other | Admitting: Physician Assistant

## 2022-09-08 ENCOUNTER — Ambulatory Visit
Admission: RE | Admit: 2022-09-08 | Discharge: 2022-09-08 | Disposition: A | Discharge status: Home / Self Care | Payer: Medicare Other | Attending: Family Medicine | Admitting: Family Medicine

## 2022-09-08 ENCOUNTER — Encounter: Payer: Self-pay | Admitting: Physician Assistant

## 2022-09-08 ENCOUNTER — Ambulatory Visit: Admission: RE | Admit: 2022-09-08 | Payer: Medicare Other | Source: Ambulatory Visit | Admitting: *Deleted

## 2022-09-08 VITALS — BP 111/76 | HR 84 | Temp 97.8°F | Ht 62.0 in | Wt 175.5 lb

## 2022-09-08 VITALS — BP 124/82 | HR 90 | Temp 98.0°F | Ht 62.0 in | Wt 173.2 lb

## 2022-09-08 DIAGNOSIS — E119 Type 2 diabetes mellitus without complications: Secondary | ICD-10-CM | POA: Diagnosis not present

## 2022-09-08 DIAGNOSIS — K648 Other hemorrhoids: Secondary | ICD-10-CM

## 2022-09-08 DIAGNOSIS — K5901 Slow transit constipation: Secondary | ICD-10-CM | POA: Diagnosis not present

## 2022-09-08 DIAGNOSIS — J189 Pneumonia, unspecified organism: Secondary | ICD-10-CM | POA: Diagnosis not present

## 2022-09-08 DIAGNOSIS — J90 Pleural effusion, not elsewhere classified: Secondary | ICD-10-CM | POA: Diagnosis not present

## 2022-09-08 DIAGNOSIS — R61 Generalized hyperhidrosis: Secondary | ICD-10-CM

## 2022-09-08 DIAGNOSIS — R0989 Other specified symptoms and signs involving the circulatory and respiratory systems: Secondary | ICD-10-CM | POA: Diagnosis not present

## 2022-09-08 DIAGNOSIS — R059 Cough, unspecified: Secondary | ICD-10-CM | POA: Diagnosis not present

## 2022-09-08 LAB — POCT URINALYSIS DIPSTICK
Bilirubin, UA: 2
Blood, UA: 2
Glucose, UA: NEGATIVE
Ketones, UA: 2
Nitrite, UA: 2
Protein, UA: NEGATIVE
Spec Grav, UA: 1.015 (ref 1.010–1.025)
Urobilinogen, UA: 0.2 E.U./dL
pH, UA: 6 (ref 5.0–8.0)

## 2022-09-08 LAB — MICROALBUMIN / CREATININE URINE RATIO
Creatinine,U: 64 mg/dL
Microalb Creat Ratio: 1.1 mg/g (ref 0.0–30.0)
Microalb, Ur: <0.7 mg/dL (ref 0.0–1.9)

## 2022-09-08 MED ORDER — CEFPODOXIME PROXETIL 200 MG PO TABS
200.0000 mg | ORAL_TABLET | Freq: Two times a day (BID) | ORAL | 0 refills | Status: DC
Start: 2022-09-08 — End: 2022-10-16

## 2022-09-08 MED ORDER — AZITHROMYCIN 250 MG PO TABS
ORAL_TABLET | ORAL | 0 refills | Status: AC
Start: 2022-09-08 — End: 2022-09-13

## 2022-09-08 NOTE — Progress Notes (Signed)
Marikay Alar, MD Phone: [phone]  Nicole Washington is a 72 y.o. female who presents today for same day visit.   Cough: Patient notes onset of cough about 10 days ago though she notes she never really recovered all the way from having COVID last month.  She notes over the last 10 days she started to have cough that is productive of yellow/green mucus.  She does have some congestion in her chest.  Notes occasional postnasal drip.  Some sore throat initially at the onset of this.  No fevers, shortness of breath, or wheezing.  She has not tried any medications for this.  She did have COVID at the end of June.  Social History   Tobacco Use  Smoking Status Former   Current packs/day: 0.00   Types: Cigarettes   Quit date: 12/20/1989   Years since quitting: 32.7  Smokeless Tobacco Never    Current Outpatient Medications on File Prior to Visit  Medication Sig Dispense Refill   amitriptyline (ELAVIL) 10 MG tablet amitriptyline 10 mg tablet  Take 1 tablet twice a day by oral route for 90 days.     amLODipine (NORVASC) 5 MG tablet TAKE 1 TABLET BY MOUTH EVERY DAY 90 tablet 1   atorvastatin (LIPITOR) 40 MG tablet TAKE 1 TABLET BY MOUTH EVERY DAY 90 tablet 3   carvedilol (COREG) 3.125 MG tablet TAKE 1 TABLET BY MOUTH TWICE A DAY WITH FOOD 180 tablet 1   clonazePAM (KLONOPIN) 0.5 MG tablet TAKE 2 TABLETS BY MOUTH DAILY AS NEEDED FOR ANXIETY 60 tablet 0   DULoxetine (CYMBALTA) 60 MG capsule Take 60 mg by mouth 2 (two) times daily.     linaclotide (LINZESS) 72 MCG capsule Take 1 capsule (72 mcg total) by mouth daily before breakfast. 30 capsule 1   losartan (COZAAR) 50 MG tablet Take 1 tablet (50 mg total) by mouth daily. 90 tablet 3   omeprazole (PRILOSEC) 20 MG capsule TAKE 1 CAPSULE BY MOUTH EVERY DAY 90 capsule 1   pregabalin (LYRICA) 150 MG capsule Take 150 mg by mouth 2 (two) times daily.     sertraline (ZOLOFT) 100 MG tablet Take 100 mg by mouth daily.     traZODone (DESYREL) 100 MG tablet  Take 100 mg by mouth at bedtime.     valACYclovir (VALTREX) 1000 MG tablet Take 1 tablet (1,000 mg total) by mouth as directed. At onset of fever blister symptoms take 2 tablets po then 12 hours later take 2 tablets po 30 tablet 6   No current facility-administered medications on file prior to visit.     ROS see history of present illness  Objective  Physical Exam Vitals:   09/08/22 0805  BP: 124/82  Pulse: 90  Temp: 98 F (36.7 C)  SpO2: 95%    BP Readings from Last 3 Encounters:  09/08/22 124/82  08/27/22 113/68  07/08/22 124/80   Wt Readings from Last 3 Encounters:  09/08/22 173 lb 3.2 oz (78.6 kg)  08/27/22 162 lb (73.5 kg)  07/08/22 173 lb (78.5 kg)    Physical Exam Constitutional:      General: She is not in acute distress.    Appearance: She is not diaphoretic.  Cardiovascular:     Rate and Rhythm: Normal rate and regular rhythm.     Heart sounds: Normal heart sounds.  Pulmonary:     Effort: Pulmonary effort is normal.     Comments: Crackles left mid and lower lung fields Skin:    General:  Skin is warm and dry.  Neurological:     Mental Status: She is alert.      Assessment/Plan: Please see individual problem list.  Community acquired pneumonia of left lower lobe of lung Assessment & Plan: Suspect pneumonia based on history and exam.  Will get a chest x-ray.  We are starting on cefpodoxime 200 mg twice daily and azithromycin 500 mg on day 1 and then 250 mg on days 2 through 5.  Advised to seek medical attention for shortness of breath, cough productive of blood, or any new or worsening symptoms.  Orders: -     Cefpodoxime Proxetil; Take 1 tablet (200 mg total) by mouth 2 (two) times daily.  Dispense: 14 tablet; Refill: 0 -     Azithromycin; Take 2 tablets on day 1, then 1 tablet daily on days 2 through 5  Dispense: 6 tablet; Refill: 0 -     DG Chest 2 View; Future  Night sweats -     POCT urinalysis dipstick  Type 2 diabetes mellitus without  complication, without long-term current use of insulin (HCC) -     Microalbumin / creatinine urine ratio   Patient was scheduled to come back later this week for urine testing.  Will complete this today.  Return if symptoms worsen or fail to improve.   Marikay Alar, MD [facility] Primary [facility]

## 2022-09-08 NOTE — Patient Instructions (Signed)
Nice to see you. We are going to treat you with 2 antibiotics for your likely pneumonia.  If you develop excessive diarrhea while on these please let us know. Please go to the [facility] for your x-ray.  I have given you the address below. If you develop progressive shortness of breath or any significantly worsening symptoms please seek medical attention.  [address] [facility] [address], [place], [place] [address]

## 2022-09-08 NOTE — Assessment & Plan Note (Signed)
Suspect pneumonia based on history and exam.  Will get a chest x-ray.  We are starting on cefpodoxime 200 mg twice daily and azithromycin 500 mg on day 1 and then 250 mg on days 2 through 5.  Advised to seek medical attention for shortness of breath, cough productive of blood, or any new or worsening symptoms.

## 2022-09-09 ENCOUNTER — Telehealth: Payer: Self-pay

## 2022-09-09 DIAGNOSIS — F39 Unspecified mood [affective] disorder: Secondary | ICD-10-CM | POA: Diagnosis not present

## 2022-09-09 DIAGNOSIS — F5105 Insomnia due to other mental disorder: Secondary | ICD-10-CM | POA: Diagnosis not present

## 2022-09-09 DIAGNOSIS — F411 Generalized anxiety disorder: Secondary | ICD-10-CM | POA: Diagnosis not present

## 2022-09-09 DIAGNOSIS — F4312 Post-traumatic stress disorder, chronic: Secondary | ICD-10-CM | POA: Diagnosis not present

## 2022-09-09 NOTE — Telephone Encounter (Signed)
Patient states she is returning a call from Prince Solian, CMA.  I read Dr. Bernardo Heater message to patient.

## 2022-09-09 NOTE — Telephone Encounter (Signed)
Left message to call the office back regarding her chest xray results below.

## 2022-09-09 NOTE — Telephone Encounter (Signed)
noted 

## 2022-09-09 NOTE — Telephone Encounter (Signed)
-----   Message from Marikay Alar sent at 09/08/2022 12:08 PM EDT ----- Please let the patient know that there was a small amount of fluid around her left lung.  This could be related to her infection.  We will see how she does with the antibiotics and if not starting to improve over the next several days I would consider additional evaluation for this to see if there is something else going on.

## 2022-09-11 ENCOUNTER — Other Ambulatory Visit: Payer: Medicare Other

## 2022-09-15 NOTE — Telephone Encounter (Signed)
Called and spoke with pt.  She has completed both antibiotics prescribed Azithromycin 250mg  x 5 days and Cefpodoxime Proxetil 200mg  x 7 days.  Pt reports feeling no improvement.  She has no energy.  Home pulse ox 91% when in office 95%.  Please advise.

## 2022-09-15 NOTE — Telephone Encounter (Signed)
Patient called, she has finished her round of antibiotics.  She feels that she needs a chest xray to check if there is still fluid on her lungs. She states she still feels weak, still has a mild cough, no color to mucus.

## 2022-09-16 ENCOUNTER — Encounter: Payer: Self-pay | Admitting: Family

## 2022-09-16 ENCOUNTER — Ambulatory Visit (INDEPENDENT_AMBULATORY_CARE_PROVIDER_SITE_OTHER): Payer: Medicare Other | Admitting: Family

## 2022-09-16 ENCOUNTER — Encounter (INDEPENDENT_AMBULATORY_CARE_PROVIDER_SITE_OTHER): Payer: Self-pay

## 2022-09-16 VITALS — BP 126/74 | HR 90 | Temp 97.8°F | Ht 62.0 in | Wt 170.0 lb

## 2022-09-16 DIAGNOSIS — J40 Bronchitis, not specified as acute or chronic: Secondary | ICD-10-CM

## 2022-09-16 NOTE — Telephone Encounter (Signed)
Pt scheduled for a same day appointment with Rennie Plowman at 12pm 09/16/22

## 2022-09-16 NOTE — Progress Notes (Signed)
Assessment & Plan:  Bronchitis Assessment & Plan: No acute respiratory distress or adventitious lung sounds . patient is afebrile.  Cough has improved.  She has completed Z-Pak, Vantin.  Reviewed chest x-ray with trace left pleural effusion.  Plan to repeat chest x-ray 6 weeks. Advised to continue Prilosec 20 mg daily and that she may add Pepcid AC 20 mg at bedtime as acid reflux may be aggravating symptoms.  I also advised that she start antihistamine such as Claritin or Zyrtec for throat clearing, hoarseness.  Patient will let me know how she is doing.  Orders: -     DG Chest 2 View; Future     Return precautions given.   Risks, benefits, and alternatives of the medications and treatment plan prescribed today were discussed, and patient expressed understanding.   Education regarding symptom management and diagnosis given to patient on AVS either electronically or printed.  No follow-ups on file.  Rennie Plowman, FNP  Subjective:    Patient ID: Nicole Washington, female    DOB: [birth year], 72 y.o.   MRN: [id number]  CC: Nicole Washington is a 72 y.o. female who presents today for an acute visit.    HPI: She complains of sporadic dry cough x 11 days, improved.   Cough is not longer productive and she 'not coughing a lot . '  Endorses throat clearing , epigastric burning, hoarseness.   She continues to feel fatigued and she has noticed that her exercise tolerance has lessened.   No formal exercise however she was able to walk on [place] recently   Covid 2 months ago.   She takes prilosec normally and she stopped as directed by pharmacist to stop while on zpak; she resumed prilosec 2 days ago.   No fever, cp, leg swelling, wheezing, orthopnea, facial pain, ear pain.     She was here 09/08/22 , started on zpak,vantin 7 day course   Trace left pleural effusion H/o CAD, HTN , PNA, GERD, BCC   Colonoscopy UTD 08/27/2022.  No repeat recommended  Former smoker , quit  1991   Allergies: Codeine, Ivp dye [iodinated contrast media], and Pneumovax 23 [pneumococcal vac polyvalent] Current Outpatient Medications on File Prior to Visit  Medication Sig Dispense Refill   amitriptyline (ELAVIL) 10 MG tablet amitriptyline 10 mg tablet  Take 1 tablet twice a day by oral route for 90 days.     amLODipine (NORVASC) 5 MG tablet TAKE 1 TABLET BY MOUTH EVERY DAY 90 tablet 1   atorvastatin (LIPITOR) 40 MG tablet TAKE 1 TABLET BY MOUTH EVERY DAY 90 tablet 3   carvedilol (COREG) 3.125 MG tablet TAKE 1 TABLET BY MOUTH TWICE A DAY WITH FOOD 180 tablet 1   clonazePAM (KLONOPIN) 0.5 MG tablet TAKE 2 TABLETS BY MOUTH DAILY AS NEEDED FOR ANXIETY 60 tablet 0   DULoxetine (CYMBALTA) 60 MG capsule Take 60 mg by mouth 2 (two) times daily.     linaclotide (LINZESS) 72 MCG capsule Take 1 capsule (72 mcg total) by mouth daily before breakfast. 30 capsule 1   losartan (COZAAR) 50 MG tablet Take 1 tablet (50 mg total) by mouth daily. 90 tablet 3   omeprazole (PRILOSEC) 20 MG capsule TAKE 1 CAPSULE BY MOUTH EVERY DAY 90 capsule 1   OZEMPIC, 0.25 OR 0.5 MG/DOSE, 2 MG/3ML SOPN Inject into the skin.     pregabalin (LYRICA) 150 MG capsule Take 150 mg by mouth 2 (two) times daily.     sertraline (ZOLOFT)  100 MG tablet Take 100 mg by mouth daily.     traZODone (DESYREL) 100 MG tablet Take 100 mg by mouth at bedtime.     valACYclovir (VALTREX) 1000 MG tablet Take 1 tablet (1,000 mg total) by mouth as directed. At onset of fever blister symptoms take 2 tablets po then 12 hours later take 2 tablets po 30 tablet 6   cefpodoxime (VANTIN) 200 MG tablet Take 1 tablet (200 mg total) by mouth 2 (two) times daily. (Patient not taking: Reported on 09/16/2022) 14 tablet 0   No current facility-administered medications on file prior to visit.    Review of Systems  Constitutional:  Positive for fatigue. Negative for chills and fever.  HENT:  Negative for ear pain and sinus pain.   Respiratory:  Positive for  cough. Negative for shortness of breath and wheezing.   Cardiovascular:  Negative for chest pain and palpitations.  Gastrointestinal:  Negative for nausea and vomiting.      Objective:    BP 126/74   Pulse 90   Temp 97.8 F (36.6 C)   Ht 5\' 2"  (1.575 m)   Wt 170 lb (77.1 kg)   SpO2 94%   BMI 31.09 kg/m   BP Readings from Last 3 Encounters:  09/16/22 126/74  09/08/22 111/76  09/08/22 124/82   Wt Readings from Last 3 Encounters:  09/16/22 170 lb (77.1 kg)  09/08/22 175 lb 8 oz (79.6 kg)  09/08/22 173 lb 3.2 oz (78.6 kg)    Physical Exam Vitals reviewed.  Constitutional:      Appearance: She is well-developed.  HENT:     Head: Normocephalic and atraumatic.     Right Ear: Hearing, tympanic membrane, ear canal and external ear normal. No decreased hearing noted. No drainage, swelling or tenderness. No middle ear effusion. No foreign body. Tympanic membrane is not erythematous or bulging.     Left Ear: Hearing, tympanic membrane, ear canal and external ear normal. No decreased hearing noted. No drainage, swelling or tenderness.  No middle ear effusion. No foreign body. Tympanic membrane is not erythematous or bulging.     Nose: Nose normal. No rhinorrhea.     Right Sinus: No maxillary sinus tenderness or frontal sinus tenderness.     Left Sinus: No maxillary sinus tenderness or frontal sinus tenderness.     Mouth/Throat:     Pharynx: Uvula midline. No oropharyngeal exudate or posterior oropharyngeal erythema.     Tonsils: No tonsillar abscesses.  Eyes:     Conjunctiva/sclera: Conjunctivae normal.  Cardiovascular:     Rate and Rhythm: Regular rhythm.     Pulses: Normal pulses.     Heart sounds: Normal heart sounds.  Pulmonary:     Effort: Pulmonary effort is normal.     Breath sounds: Normal breath sounds. No wheezing, rhonchi or rales.  Lymphadenopathy:     Head:     Right side of head: No submental, submandibular, tonsillar, preauricular, posterior auricular or  occipital adenopathy.     Left side of head: No submental, submandibular, tonsillar, preauricular, posterior auricular or occipital adenopathy.     Cervical: No cervical adenopathy.  Skin:    General: Skin is warm and dry.  Neurological:     Mental Status: She is alert.  Psychiatric:        Speech: Speech normal.        Behavior: Behavior normal.        Thought Content: Thought content normal.

## 2022-09-22 ENCOUNTER — Telehealth: Payer: Self-pay | Admitting: Family Medicine

## 2022-09-22 DIAGNOSIS — R8290 Unspecified abnormal findings in urine: Secondary | ICD-10-CM

## 2022-09-22 NOTE — Telephone Encounter (Signed)
Patient recently had a urine collected that was supposed to have been sent for urine microscopy though it does not look like this was done.  Can we get her set up for recheck of her urine as there appeared to be blood on her urine dipstick?  We need the urine microscopy to confirm whether or not there is actually blood in her urine.

## 2022-09-23 NOTE — Patient Instructions (Addendum)
Overall, glad to see that your cough is improved.  Please call the office to schedule a chest x-ray 4 to 6 weeks after your previous chest x-ray 09/08/22.   For cough, please continue Prilosec 20 mg daily.  You may add Pepcid AC 20 mg at bedtime as acid reflux can contribute to cough.  You may also start antihistamine such as Claritin or Zyrtec for throat clearing, hoarseness.  Please let me know if you do not continue to improve

## 2022-09-23 NOTE — Assessment & Plan Note (Addendum)
No acute respiratory distress or adventitious lung sounds . patient is afebrile.  Cough has improved.  She has completed Z-Pak, Vantin.  Reviewed chest x-ray with trace left pleural effusion.  Plan to repeat chest x-ray 6 weeks. Advised to continue Prilosec 20 mg daily and that she may add Pepcid AC 20 mg at bedtime as acid reflux may be aggravating symptoms.  I also advised that she start antihistamine such as Claritin or Zyrtec for throat clearing, hoarseness.  Patient will let me know how she is doing.

## 2022-09-23 NOTE — Telephone Encounter (Signed)
Lab ordered.

## 2022-09-23 NOTE — Addendum Note (Signed)
Addended by: [name] on: 09/23/2022 03:32 PM   Modules accepted: Orders

## 2022-09-23 NOTE — Telephone Encounter (Signed)
Patient is scheduled for 09/24/22 at 11:15.

## 2022-09-24 ENCOUNTER — Other Ambulatory Visit (INDEPENDENT_AMBULATORY_CARE_PROVIDER_SITE_OTHER): Payer: Medicare Other

## 2022-09-24 DIAGNOSIS — R8290 Unspecified abnormal findings in urine: Secondary | ICD-10-CM

## 2022-09-24 LAB — URINALYSIS, MICROSCOPIC ONLY: RBC / HPF: NONE SEEN (ref 0–?)

## 2022-09-25 ENCOUNTER — Encounter: Payer: Self-pay | Admitting: Family Medicine

## 2022-09-28 NOTE — Telephone Encounter (Signed)
Pot is requesting results

## 2022-09-29 ENCOUNTER — Other Ambulatory Visit: Payer: Self-pay | Admitting: Family Medicine

## 2022-09-29 DIAGNOSIS — I10 Essential (primary) hypertension: Secondary | ICD-10-CM

## 2022-10-05 ENCOUNTER — Other Ambulatory Visit: Payer: Self-pay | Admitting: Family Medicine

## 2022-10-09 ENCOUNTER — Ambulatory Visit: Payer: Medicare Other | Admitting: Family Medicine

## 2022-10-16 ENCOUNTER — Other Ambulatory Visit: Payer: Self-pay | Admitting: Family Medicine

## 2022-10-28 ENCOUNTER — Ambulatory Visit (INDEPENDENT_AMBULATORY_CARE_PROVIDER_SITE_OTHER): Payer: Medicare Other

## 2022-10-28 ENCOUNTER — Other Ambulatory Visit: Payer: Medicare Other

## 2022-10-28 DIAGNOSIS — J90 Pleural effusion, not elsewhere classified: Secondary | ICD-10-CM | POA: Diagnosis not present

## 2022-10-28 DIAGNOSIS — J40 Bronchitis, not specified as acute or chronic: Secondary | ICD-10-CM

## 2022-10-30 DIAGNOSIS — Z23 Encounter for immunization: Secondary | ICD-10-CM | POA: Diagnosis not present

## 2022-11-03 ENCOUNTER — Ambulatory Visit: Payer: Medicare Other | Admitting: Dermatology

## 2022-11-10 ENCOUNTER — Encounter: Payer: Self-pay | Admitting: Family

## 2022-11-10 ENCOUNTER — Ambulatory Visit: Payer: Medicare Other | Admitting: Dermatology

## 2022-11-19 ENCOUNTER — Other Ambulatory Visit: Payer: Self-pay | Admitting: Family Medicine

## 2022-11-23 ENCOUNTER — Other Ambulatory Visit: Payer: Self-pay | Admitting: Family Medicine

## 2022-11-23 NOTE — Telephone Encounter (Signed)
LOV 09/08/2022

## 2022-11-25 ENCOUNTER — Encounter: Payer: Self-pay | Admitting: Family Medicine

## 2022-11-25 ENCOUNTER — Other Ambulatory Visit: Payer: Self-pay | Admitting: Family Medicine

## 2022-11-25 ENCOUNTER — Telehealth: Payer: Self-pay | Admitting: Family Medicine

## 2022-11-25 ENCOUNTER — Ambulatory Visit: Payer: Medicare Other | Admitting: Family Medicine

## 2022-11-25 VITALS — BP 118/74 | HR 90 | Temp 98.3°F | Ht 62.0 in | Wt 170.2 lb

## 2022-11-25 DIAGNOSIS — Z23 Encounter for immunization: Secondary | ICD-10-CM | POA: Diagnosis not present

## 2022-11-25 DIAGNOSIS — R590 Localized enlarged lymph nodes: Secondary | ICD-10-CM | POA: Diagnosis not present

## 2022-11-25 DIAGNOSIS — R591 Generalized enlarged lymph nodes: Secondary | ICD-10-CM

## 2022-11-25 HISTORY — DX: Localized enlarged lymph nodes: R59.0

## 2022-11-25 MED ORDER — PREDNISONE 50 MG PO TABS
ORAL_TABLET | ORAL | 0 refills | Status: DC
Start: 2022-11-25 — End: 2023-03-03

## 2022-11-25 NOTE — Assessment & Plan Note (Addendum)
Given how long this has been present we will get a CT soft tissue neck to evaluate further.  Will also check CBC with differential today.  Patient has a contrast allergy.  She will need premedication with prednisone and Benadryl as outlined below.  Patient confirms she only had itching with the contrast previously.  She noted no airway involvement.  Prednisone 50 mg PO 13 hours, 7 hours, and 1 hour before contrast injection.   diphenhydrAMINE (BENADRYL) 50 mg PO within 1 hour of contrast injection

## 2022-11-25 NOTE — Addendum Note (Signed)
Addended by: Prince Solian A on: 11/25/2022 03:27 PM   Modules accepted: Orders

## 2022-11-25 NOTE — Progress Notes (Addendum)
Marikay Alar, MD Phone: [phone]  Nicole Washington is a 72 y.o. female who presents today for same-day visit.  Lymphadenopathy: Patient notes she has had a lymph node in her left neck that is been present since 2021.  She notes no change there.  She notes that the lymph node in her right neck that has been present since the spring when she was sick on multiple occasions.  She notes that lymph node has been sore.  Social History   Tobacco Use  Smoking Status Former   Current packs/day: 0.00   Types: Cigarettes   Quit date: 12/20/1989   Years since quitting: 32.9  Smokeless Tobacco Never    Current Outpatient Medications on File Prior to Visit  Medication Sig Dispense Refill   amitriptyline (ELAVIL) 10 MG tablet amitriptyline 10 mg tablet  Take 1 tablet twice a day by oral route for 90 days.     amLODipine (NORVASC) 5 MG tablet TAKE 1 TABLET BY MOUTH EVERY DAY 90 tablet 1   atorvastatin (LIPITOR) 40 MG tablet TAKE 1 TABLET BY MOUTH EVERY DAY 90 tablet 3   carvedilol (COREG) 3.125 MG tablet TAKE 1 TABLET BY MOUTH TWICE A DAY WITH FOOD 180 tablet 1   clonazePAM (KLONOPIN) 0.5 MG tablet TAKE 2 TABLETS BY MOUTH DAILY AS NEEDED FOR ANXIETY 60 tablet 0   DULoxetine (CYMBALTA) 60 MG capsule Take 60 mg by mouth 2 (two) times daily.     linaclotide (LINZESS) 72 MCG capsule Take 1 capsule (72 mcg total) by mouth daily before breakfast. 30 capsule 1   losartan (COZAAR) 50 MG tablet TAKE 1 TABLET BY MOUTH EVERY DAY 90 tablet 3   omeprazole (PRILOSEC) 20 MG capsule TAKE 1 CAPSULE BY MOUTH EVERY DAY 90 capsule 1   pregabalin (LYRICA) 150 MG capsule Take 150 mg by mouth 2 (two) times daily.     Semaglutide,0.25 or 0.5MG /DOS, (OZEMPIC, 0.25 OR 0.5 MG/DOSE,) 2 MG/3ML SOPN Inject 0.5 mg as directed once a week. 3 mL 2   sertraline (ZOLOFT) 100 MG tablet Take 100 mg by mouth daily.     traZODone (DESYREL) 100 MG tablet Take 100 mg by mouth at bedtime.     valACYclovir (VALTREX) 1000 MG tablet Take 1  tablet (1,000 mg total) by mouth as directed. At onset of fever blister symptoms take 2 tablets po then 12 hours later take 2 tablets po 30 tablet 6   No current facility-administered medications on file prior to visit.     ROS see history of present illness  Objective  Physical Exam Vitals:   11/25/22 1419  BP: 118/74  Pulse: 90  Temp: 98.3 F (36.8 C)  SpO2: 96%    BP Readings from Last 3 Encounters:  11/25/22 118/74  09/16/22 126/74  09/08/22 111/76   Wt Readings from Last 3 Encounters:  11/25/22 170 lb 3.2 oz (77.2 kg)  09/16/22 170 lb (77.1 kg)  09/08/22 175 lb 8 oz (79.6 kg)    Physical Exam Neck:       Assessment/Plan: Please see individual problem list.  Lymphadenopathy, cervical Assessment & Plan: Given how long this has been present we will get a CT soft tissue neck to evaluate further.  Will also check CBC with differential today.  Patient has a contrast allergy.  She will need premedication with prednisone and Benadryl as outlined below.  Patient confirms she only had itching with the contrast previously.  She noted no airway involvement.  Prednisone 50 mg PO 13  hours, 7 hours, and 1 hour before contrast injection.   diphenhydrAMINE (BENADRYL) 50 mg PO within 1 hour of contrast injection  Orders: -     CBC with Differential/Platelet -     CT SOFT TISSUE NECK W CONTRAST; Future -     predniSONE; Take Prednisone 50 mg (one tablet) PO 13 hours, 7 hours, and 1 hour before contrast injection.  Dispense: 3 tablet; Refill: 0    Return in about 3 months (around 02/25/2023).  Health maintenance: Flu vaccine given today.  Patient will get RSV vaccine at the pharmacy.  Marikay Alar, MD [facility]

## 2022-11-25 NOTE — Patient Instructions (Addendum)
Please let us know if you do not hear about getting the CT scan scheduled in the next 1 to 2 weeks.  You would need to premedicate prior to your CT scan.  Do the following:  Prednisone 50 mg PO 13 hours, 7 hours, and 1 hour before contrast injection.   diphenhydrAMINE (BENADRYL) 50 mg by mouth within 1 hour of contrast injection

## 2022-11-25 NOTE — Telephone Encounter (Signed)
Please let the patient know that we need to check her kidney function prior to her having the CT scan done.  I have placed this order.  Her kidney function was checked in May though that is evidently not recent enough to have the scan completed.

## 2022-11-25 NOTE — Addendum Note (Signed)
Addended by: Prince Solian A on: 11/25/2022 03:19 PM   Modules accepted: Orders

## 2022-11-26 LAB — CBC WITH DIFFERENTIAL/PLATELET
Basophils Absolute: 0.1 10*3/uL (ref 0.0–0.1)
Basophils Relative: 1.2 % (ref 0.0–3.0)
Eosinophils Absolute: 0.1 10*3/uL (ref 0.0–0.7)
Eosinophils Relative: 1.3 % (ref 0.0–5.0)
HCT: 41.4 % (ref 36.0–46.0)
Hemoglobin: 13.8 g/dL (ref 12.0–15.0)
Lymphocytes Relative: 25.5 % (ref 12.0–46.0)
Lymphs Abs: 2.1 10*3/uL (ref 0.7–4.0)
MCHC: 33.3 g/dL (ref 30.0–36.0)
MCV: 84.1 fL (ref 78.0–100.0)
Monocytes Absolute: 0.5 10*3/uL (ref 0.1–1.0)
Monocytes Relative: 5.8 % (ref 3.0–12.0)
Neutro Abs: 5.4 10*3/uL (ref 1.4–7.7)
Neutrophils Relative %: 66.2 % (ref 43.0–77.0)
Platelets: 358 10*3/uL (ref 150.0–400.0)
RBC: 4.92 Mil/uL (ref 3.87–5.11)
RDW: 15.1 % (ref 11.5–15.5)
WBC: 8.2 10*3/uL (ref 4.0–10.5)

## 2022-11-26 NOTE — Telephone Encounter (Signed)
Patient states she is returning our call.  I read Prince Solian, CMA's message to patient.  Patient states she just had her blood drawn yesterday and she would like to know if this blood can be used for the kidney test.

## 2022-11-26 NOTE — Telephone Encounter (Signed)
Left message to call the office back regarding needing to check her kidney function prior to her having the CT. Her kidney function was checked in May though that is evidently not recent enough to have the scan completed. Schedule a kidney function lab.

## 2022-11-27 ENCOUNTER — Other Ambulatory Visit (INDEPENDENT_AMBULATORY_CARE_PROVIDER_SITE_OTHER): Payer: Medicare Other

## 2022-11-27 DIAGNOSIS — R591 Generalized enlarged lymph nodes: Secondary | ICD-10-CM | POA: Diagnosis not present

## 2022-11-27 NOTE — Addendum Note (Signed)
Addended by: Warden Fillers on: 11/27/2022 01:32 PM   Modules accepted: Orders

## 2022-11-27 NOTE — Telephone Encounter (Signed)
Unfortunately it cannot.  It was not the correct tube to check for kidney function.

## 2022-11-27 NOTE — Telephone Encounter (Signed)
Patient called to follow-up on her previous message.  I read message from Dr. Marikay Alar to patient.  Patient states she is scheduled to have her CT scan on Monday (11/30/2022).  I scheduled a lab visit for patient today at 3pm.

## 2022-11-28 LAB — BASIC METABOLIC PANEL
BUN/Creatinine Ratio: 20 (ref 12–28)
BUN: 18 mg/dL (ref 8–27)
CO2: 24 mmol/L (ref 20–29)
Calcium: 9.5 mg/dL (ref 8.7–10.3)
Chloride: 97 mmol/L (ref 96–106)
Creatinine, Ser: 0.89 mg/dL (ref 0.57–1.00)
Glucose: 94 mg/dL (ref 70–99)
Potassium: 4.4 mmol/L (ref 3.5–5.2)
Sodium: 137 mmol/L (ref 134–144)
eGFR: 68 mL/min/{1.73_m2} (ref >59)

## 2022-11-30 ENCOUNTER — Ambulatory Visit
Admission: RE | Admit: 2022-11-30 | Discharge: 2022-11-30 | Disposition: A | Discharge status: Home / Self Care | Payer: Medicare Other | Source: Ambulatory Visit | Attending: Family Medicine | Admitting: Family Medicine

## 2022-11-30 DIAGNOSIS — R590 Localized enlarged lymph nodes: Secondary | ICD-10-CM | POA: Diagnosis not present

## 2022-11-30 DIAGNOSIS — M47812 Spondylosis without myelopathy or radiculopathy, cervical region: Secondary | ICD-10-CM | POA: Diagnosis not present

## 2022-11-30 DIAGNOSIS — M4802 Spinal stenosis, cervical region: Secondary | ICD-10-CM | POA: Diagnosis not present

## 2022-11-30 MED ORDER — IOHEXOL 300 MG/ML  SOLN
75.0000 mL | Freq: Once | INTRAMUSCULAR | Status: AC | PRN
  Administered 2022-11-30: 75 mL via INTRAVENOUS

## 2022-12-02 DIAGNOSIS — F411 Generalized anxiety disorder: Secondary | ICD-10-CM | POA: Diagnosis not present

## 2022-12-02 DIAGNOSIS — F4312 Post-traumatic stress disorder, chronic: Secondary | ICD-10-CM | POA: Diagnosis not present

## 2022-12-02 DIAGNOSIS — F39 Unspecified mood [affective] disorder: Secondary | ICD-10-CM | POA: Diagnosis not present

## 2022-12-02 DIAGNOSIS — F5105 Insomnia due to other mental disorder: Secondary | ICD-10-CM | POA: Diagnosis not present

## 2022-12-11 NOTE — Telephone Encounter (Signed)
Notified pt. Results are coming back between 2-3 weeks. Informed pt we would give her a call once they come back

## 2022-12-11 NOTE — Telephone Encounter (Signed)
Patient just called and wants to know the status of her CT scan she had on last Monday. She said no one has called her yet with her results. Her number is [phone].

## 2022-12-15 IMAGING — DX DG CHEST 2V
2 series · 2 of 2 positions shown · non-contrast
Comparison: September 08, 2020

CLINICAL DATA: Ongoing cough.  No improvement with antibiotics.

EXAM:
CHEST - 2 VIEW

[chest pa]
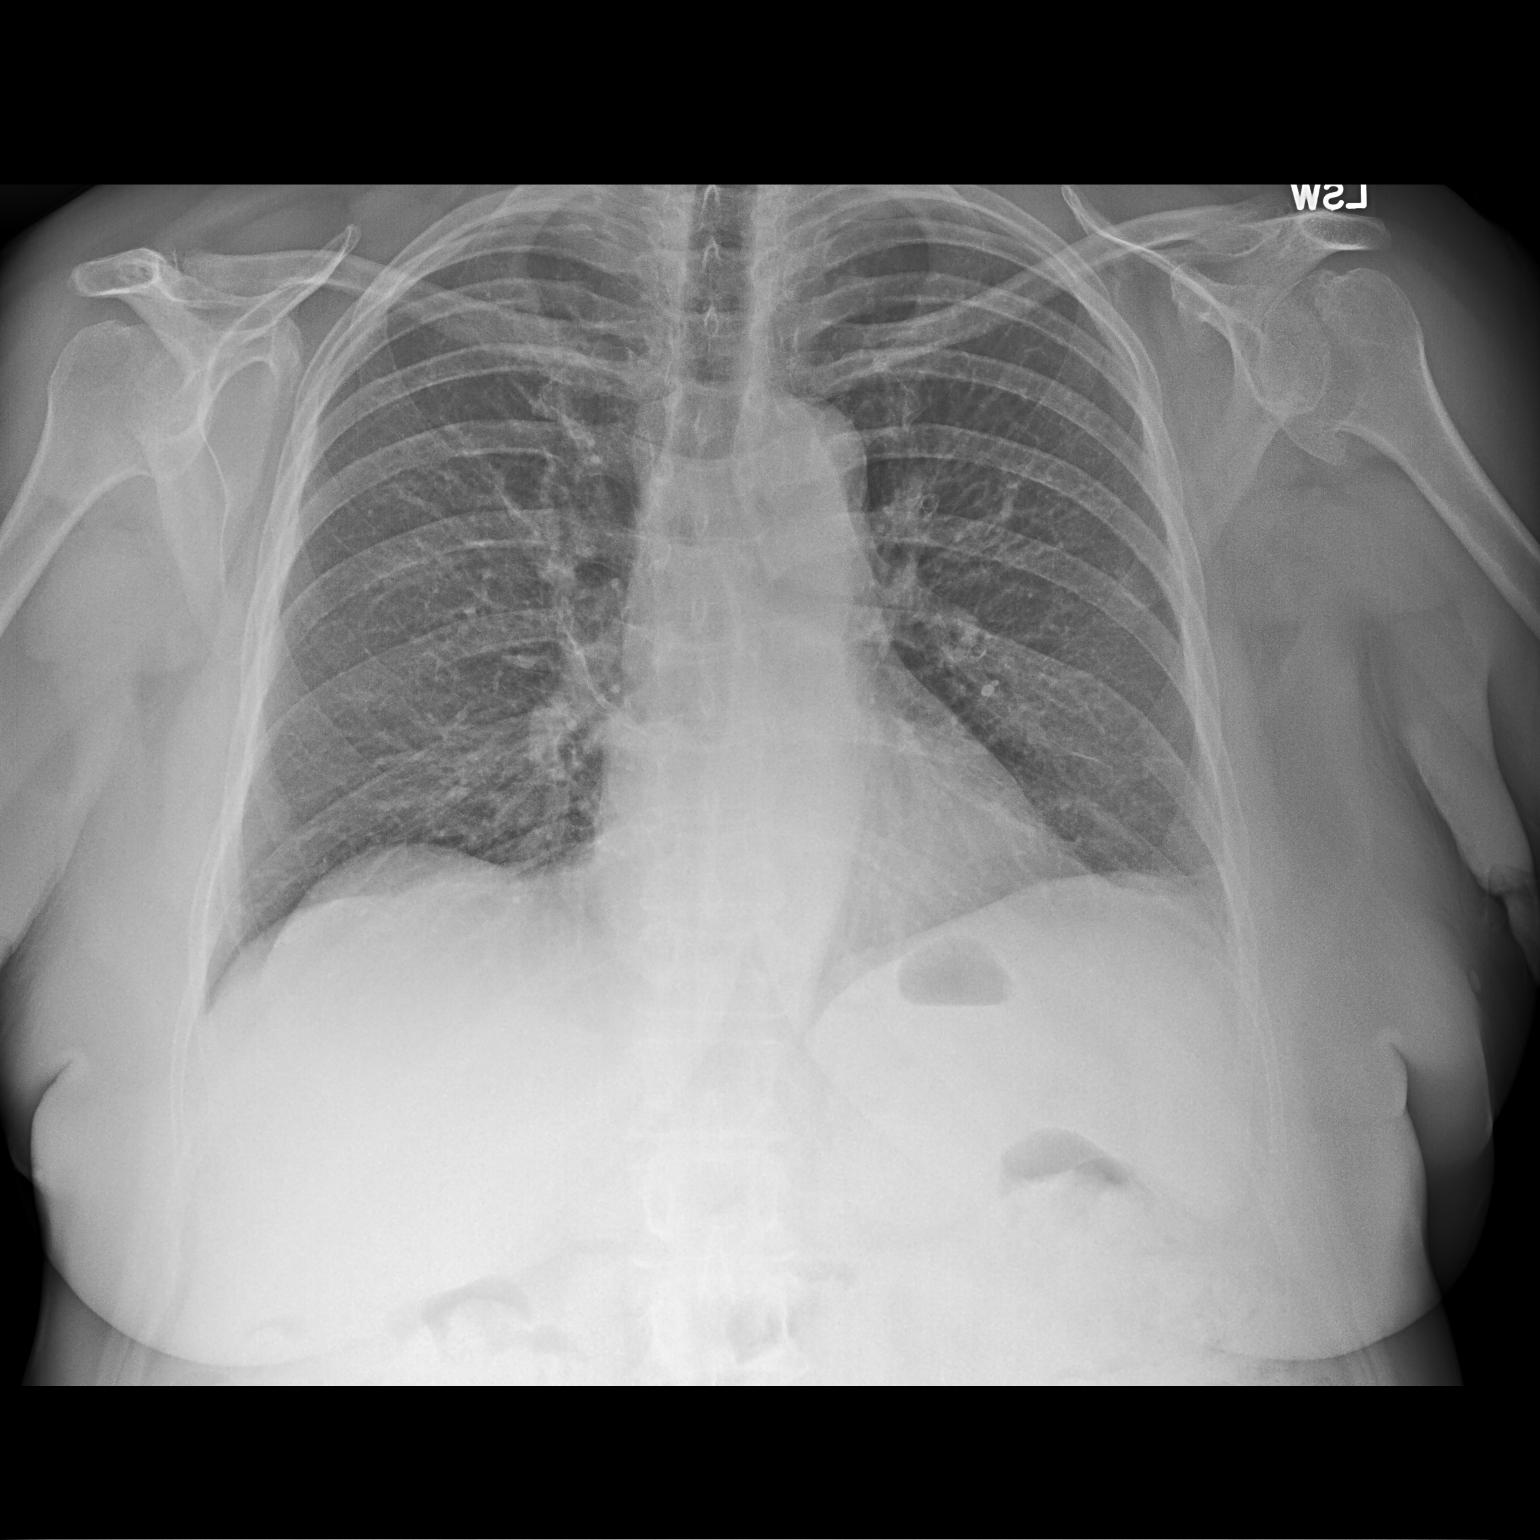

[chest lat]
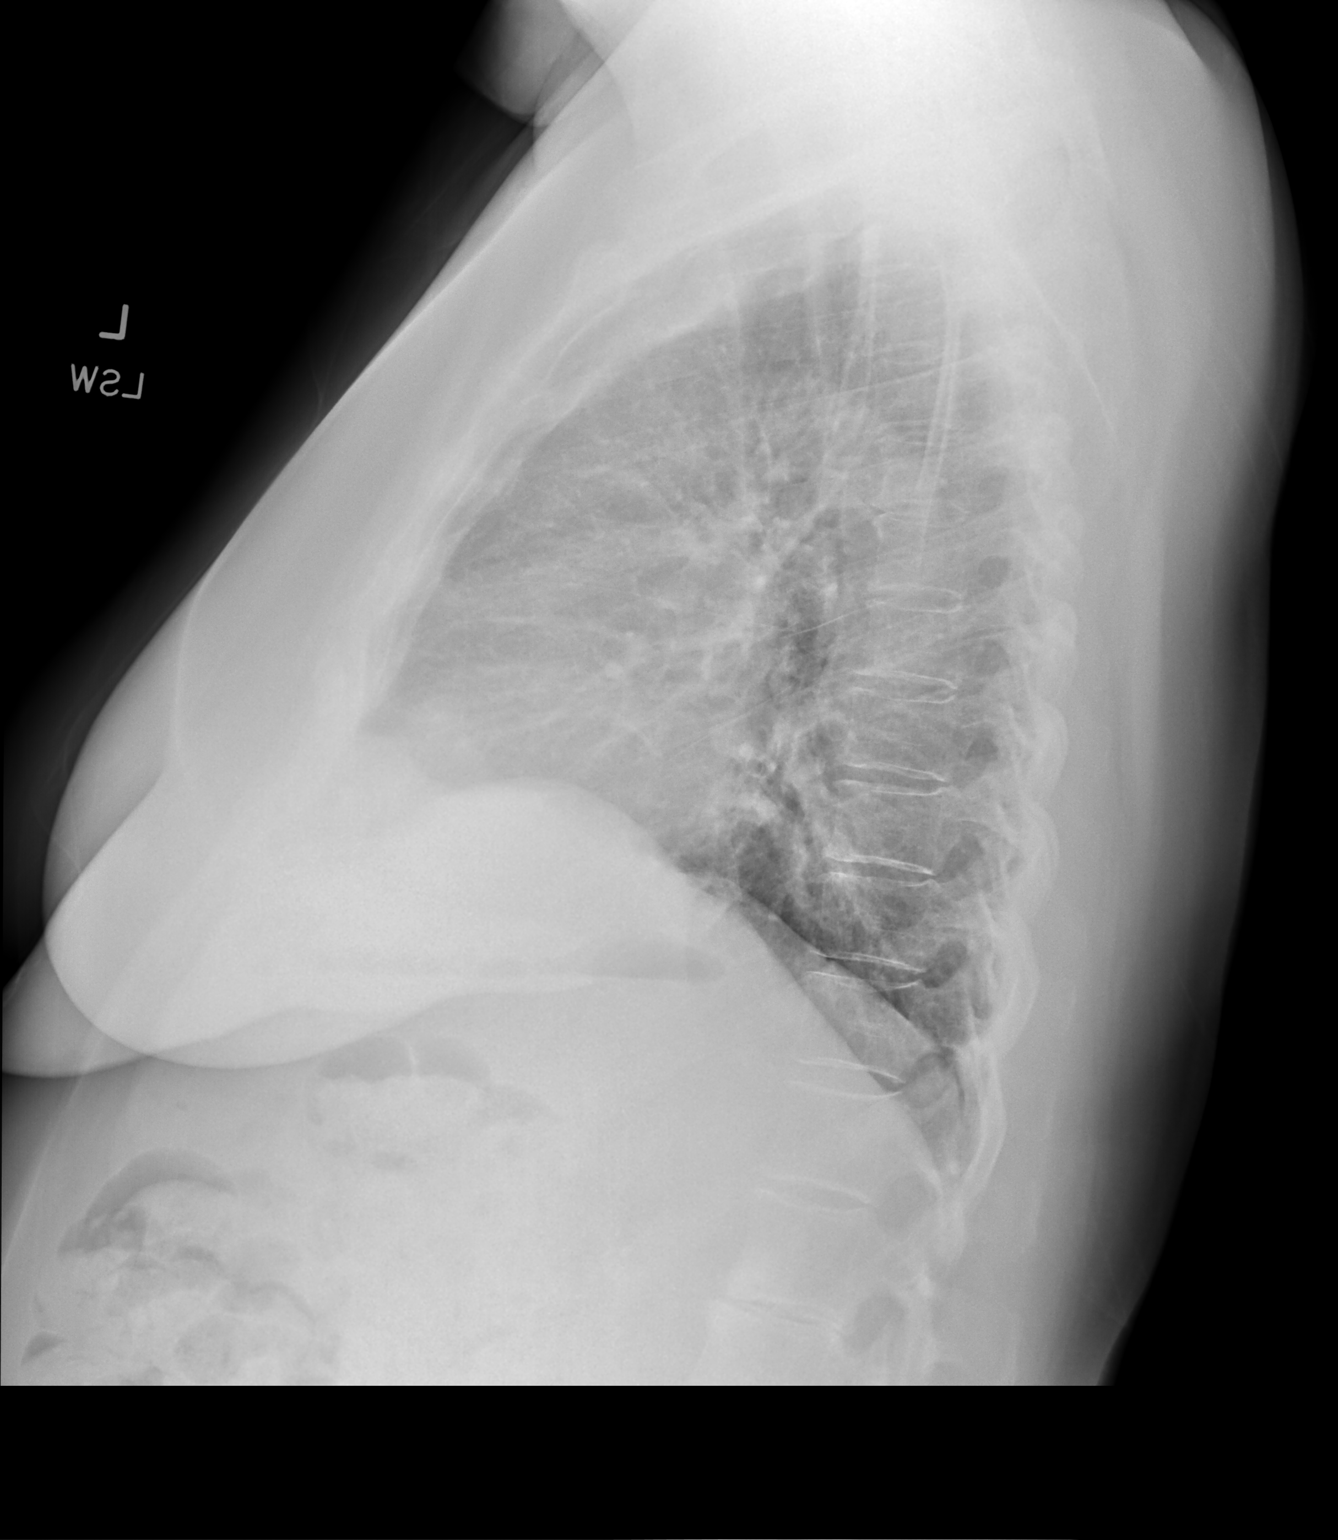

[2 of 2 positions shown; findings below may reference images not displayed]

FINDINGS: The heart size and mediastinal contours are within normal limits.
Both lungs are clear. The visualized skeletal structures are
unremarkable.
IMPRESSION: No active cardiopulmonary disease.

## 2022-12-16 ENCOUNTER — Encounter: Payer: Self-pay | Admitting: Family Medicine

## 2022-12-18 NOTE — Telephone Encounter (Signed)
Noted  

## 2022-12-21 ENCOUNTER — Encounter: Payer: Self-pay | Admitting: Family Medicine

## 2022-12-23 DIAGNOSIS — M797 Fibromyalgia: Secondary | ICD-10-CM | POA: Diagnosis not present

## 2022-12-23 DIAGNOSIS — K146 Glossodynia: Secondary | ICD-10-CM | POA: Diagnosis not present

## 2022-12-26 ENCOUNTER — Other Ambulatory Visit: Payer: Self-pay | Admitting: Family Medicine

## 2022-12-30 ENCOUNTER — Telehealth: Payer: Self-pay

## 2022-12-30 NOTE — Telephone Encounter (Signed)
-----   Message from Marikay Alar sent at 12/29/2022  1:34 PM EST ----- Noted.  We could have her see a surgeon to consider whether or not this area needs to be removed to determine what it is.  I can place that order if she is willing to do that.

## 2022-12-30 NOTE — Telephone Encounter (Signed)
Left message to call the office back regarding Dr. Purvis Sheffield message below. We could have her see a surgeon to consider whether or not this area needs to be removed to determine what it is. I can place that order if she is willing to do that.

## 2023-01-17 ENCOUNTER — Other Ambulatory Visit: Payer: Self-pay | Admitting: Family Medicine

## 2023-02-02 DIAGNOSIS — M25512 Pain in left shoulder: Secondary | ICD-10-CM | POA: Diagnosis not present

## 2023-02-02 DIAGNOSIS — M25511 Pain in right shoulder: Secondary | ICD-10-CM | POA: Diagnosis not present

## 2023-02-02 DIAGNOSIS — M542 Cervicalgia: Secondary | ICD-10-CM | POA: Diagnosis not present

## 2023-02-03 ENCOUNTER — Encounter: Payer: Self-pay | Admitting: Family Medicine

## 2023-02-03 DIAGNOSIS — M542 Cervicalgia: Secondary | ICD-10-CM | POA: Diagnosis not present

## 2023-02-03 DIAGNOSIS — M25512 Pain in left shoulder: Secondary | ICD-10-CM | POA: Diagnosis not present

## 2023-02-03 DIAGNOSIS — M25511 Pain in right shoulder: Secondary | ICD-10-CM | POA: Diagnosis not present

## 2023-02-03 NOTE — Telephone Encounter (Signed)
I see surgical notes 02/2022, but no indication if patient has retinopathy or not. Care team updated and letter sent for eye exam notes.

## 2023-02-05 ENCOUNTER — Other Ambulatory Visit: Payer: Self-pay | Admitting: Family Medicine

## 2023-02-06 ENCOUNTER — Other Ambulatory Visit: Payer: Self-pay | Admitting: Nurse Practitioner

## 2023-02-08 ENCOUNTER — Other Ambulatory Visit (HOSPITAL_COMMUNITY): Payer: Self-pay

## 2023-02-18 DIAGNOSIS — M25511 Pain in right shoulder: Secondary | ICD-10-CM | POA: Diagnosis not present

## 2023-02-22 DIAGNOSIS — M47812 Spondylosis without myelopathy or radiculopathy, cervical region: Secondary | ICD-10-CM | POA: Diagnosis not present

## 2023-02-26 ENCOUNTER — Ambulatory Visit: Payer: Medicare Other | Admitting: Family Medicine

## 2023-03-01 DIAGNOSIS — M47812 Spondylosis without myelopathy or radiculopathy, cervical region: Secondary | ICD-10-CM | POA: Diagnosis not present

## 2023-03-02 ENCOUNTER — Encounter: Payer: Self-pay | Admitting: Dermatology

## 2023-03-02 ENCOUNTER — Ambulatory Visit (INDEPENDENT_AMBULATORY_CARE_PROVIDER_SITE_OTHER): Payer: Medicare Other | Admitting: Dermatology

## 2023-03-02 ENCOUNTER — Ambulatory Visit: Payer: Medicare Other | Admitting: Dermatology

## 2023-03-02 ENCOUNTER — Other Ambulatory Visit: Payer: Self-pay | Admitting: Family Medicine

## 2023-03-02 DIAGNOSIS — Z1283 Encounter for screening for malignant neoplasm of skin: Secondary | ICD-10-CM | POA: Diagnosis not present

## 2023-03-02 DIAGNOSIS — D1801 Hemangioma of skin and subcutaneous tissue: Secondary | ICD-10-CM

## 2023-03-02 DIAGNOSIS — L820 Inflamed seborrheic keratosis: Secondary | ICD-10-CM

## 2023-03-02 DIAGNOSIS — L570 Actinic keratosis: Secondary | ICD-10-CM

## 2023-03-02 DIAGNOSIS — L814 Other melanin hyperpigmentation: Secondary | ICD-10-CM | POA: Diagnosis not present

## 2023-03-02 DIAGNOSIS — Z85828 Personal history of other malignant neoplasm of skin: Secondary | ICD-10-CM

## 2023-03-02 DIAGNOSIS — L821 Other seborrheic keratosis: Secondary | ICD-10-CM

## 2023-03-02 DIAGNOSIS — K219 Gastro-esophageal reflux disease without esophagitis: Secondary | ICD-10-CM

## 2023-03-02 DIAGNOSIS — W908XXA Exposure to other nonionizing radiation, initial encounter: Secondary | ICD-10-CM | POA: Diagnosis not present

## 2023-03-02 DIAGNOSIS — L578 Other skin changes due to chronic exposure to nonionizing radiation: Secondary | ICD-10-CM

## 2023-03-02 DIAGNOSIS — D229 Melanocytic nevi, unspecified: Secondary | ICD-10-CM

## 2023-03-02 NOTE — Patient Instructions (Signed)

## 2023-03-02 NOTE — Progress Notes (Signed)
 Follow-Up Visit   Subjective  Nicole Washington is a 73 y.o. female who presents for the following: Skin Cancer Screening and Full Body Skin Exam  The patient presents for Total-Body Skin Exam (TBSE) for skin cancer screening and mole check. The patient has spots, moles and lesions to be evaluated, some may be new or changing and the patient may have concern these could be cancer.  Patient with hx of BCC, SCC. Patient does have a few spots that she picks at on arms.   The following portions of the chart were reviewed this encounter and updated as appropriate: medications, allergies, medical history  Review of Systems:  No other skin or systemic complaints except as noted in HPI or Assessment and Plan.  Objective  Well appearing patient in no apparent distress; mood and affect are within normal limits.  A full examination was performed including scalp, head, eyes, ears, nose, lips, neck, chest, axillae, abdomen, back, buttocks, bilateral upper extremities, bilateral lower extremities, hands, feet, fingers, toes, fingernails, and toenails. All findings within normal limits unless otherwise noted below.   Exam of nails limited by presence of nail polish.  Relevant physical exam findings are noted in the Assessment and Plan.  Left Forearm x 1 Erythematous thin papules/macules with gritty scale.  R Lower Leg x 1, L forearm x 1 (2) Erythematous stuck-on, waxy papule or plaque  Assessment & Plan   SKIN CANCER SCREENING PERFORMED TODAY.  ACTINIC DAMAGE - Chronic condition, secondary to cumulative UV/sun exposure - diffuse scaly erythematous macules with underlying dyspigmentation - Recommend daily broad spectrum sunscreen SPF 30+ to sun-exposed areas, reapply every 2 hours as needed.  - Staying in the shade or wearing long sleeves, sun glasses (UVA+UVB protection) and wide brim hats (4-inch brim around the entire circumference of the hat) are also recommended for sun protection.  - Call for  new or changing lesions.  LENTIGINES, SEBORRHEIC KERATOSES, HEMANGIOMAS - Benign normal skin lesions - Benign-appearing - Call for any changes  MELANOCYTIC NEVI - Tan-brown and/or pink-flesh-colored symmetric macules and papules - Benign appearing on exam today - Observation - Call clinic for new or changing moles - Recommend daily use of broad spectrum spf 30+ sunscreen to sun-exposed areas.   HISTORY OF BASAL CELL CARCINOMA OF THE SKIN - No evidence of recurrence today - Recommend regular full body skin exams - Recommend daily broad spectrum sunscreen SPF 30+ to sun-exposed areas, reapply every 2 hours as needed.  - Call if any new or changing lesions are noted between office visits  HISTORY OF SQUAMOUS CELL CARCINOMA OF THE SKIN - No evidence of recurrence today - No lymphadenopathy - Recommend regular full body skin exams - Recommend daily broad spectrum sunscreen SPF 30+ to sun-exposed areas, reapply every 2 hours as needed.  - Call if any new or changing lesions are noted between office visits       AK (ACTINIC KERATOSIS) Left Forearm x 1 RTC if not gone by 1 month Destruction of lesion - Left Forearm x 1 Complexity: simple   Destruction method: cryotherapy   Informed consent: discussed and consent obtained   Timeout:  patient name, date of birth, surgical site, and procedure verified Lesion destroyed using liquid nitrogen: Yes   Region frozen until ice ball extended beyond lesion: Yes   Cryo cycles: 1 or 2. Outcome: patient tolerated procedure well with no complications   Post-procedure details: wound care instructions given   INFLAMED SEBORRHEIC KERATOSIS (2) R Lower Leg x  1, L forearm x 1 (2) Symptomatic, irritating, patient would like treated.  Benign-appearing.  Call clinic for new or changing lesions.   Destruction of lesion - R Lower Leg x 1, L forearm x 1 (2) Complexity: simple   Destruction method: cryotherapy   Informed consent: discussed and  consent obtained   Timeout:  patient name, date of birth, surgical site, and procedure verified Lesion destroyed using liquid nitrogen: Yes   Region frozen until ice ball extended beyond lesion: Yes   Cryo cycles: 1 or 2. Outcome: patient tolerated procedure well with no complications   Post-procedure details: wound care instructions given   MULTIPLE BENIGN NEVI   LENTIGINES   ACTINIC ELASTOSIS   SEBORRHEIC KERATOSES   CHERRY ANGIOMA   Return in about 1 year (around 03/01/2024) for TBSE, Hx BCC, Hx SCC, Hx AK.  LILLETTE Lonell Drones, RMA, am acting as scribe for Boneta Sharps, MD .   Documentation: I have reviewed the above documentation for accuracy and completeness, and I agree with the above.  Boneta Sharps, MD

## 2023-03-03 ENCOUNTER — Encounter: Payer: Self-pay | Admitting: Family Medicine

## 2023-03-03 ENCOUNTER — Ambulatory Visit (INDEPENDENT_AMBULATORY_CARE_PROVIDER_SITE_OTHER): Payer: Medicare Other | Admitting: Family Medicine

## 2023-03-03 VITALS — BP 132/72 | Temp 98.0°F | Ht 62.0 in | Wt 168.8 lb

## 2023-03-03 DIAGNOSIS — F4312 Post-traumatic stress disorder, chronic: Secondary | ICD-10-CM | POA: Diagnosis not present

## 2023-03-03 DIAGNOSIS — E785 Hyperlipidemia, unspecified: Secondary | ICD-10-CM | POA: Diagnosis not present

## 2023-03-03 DIAGNOSIS — F419 Anxiety disorder, unspecified: Secondary | ICD-10-CM

## 2023-03-03 DIAGNOSIS — F39 Unspecified mood [affective] disorder: Secondary | ICD-10-CM | POA: Diagnosis not present

## 2023-03-03 DIAGNOSIS — K146 Glossodynia: Secondary | ICD-10-CM | POA: Diagnosis not present

## 2023-03-03 DIAGNOSIS — F5105 Insomnia due to other mental disorder: Secondary | ICD-10-CM | POA: Diagnosis not present

## 2023-03-03 DIAGNOSIS — F411 Generalized anxiety disorder: Secondary | ICD-10-CM | POA: Diagnosis not present

## 2023-03-03 DIAGNOSIS — F32A Depression, unspecified: Secondary | ICD-10-CM

## 2023-03-03 DIAGNOSIS — E119 Type 2 diabetes mellitus without complications: Secondary | ICD-10-CM | POA: Diagnosis not present

## 2023-03-03 DIAGNOSIS — M542 Cervicalgia: Secondary | ICD-10-CM | POA: Diagnosis not present

## 2023-03-03 DIAGNOSIS — Z7984 Long term (current) use of oral hypoglycemic drugs: Secondary | ICD-10-CM

## 2023-03-03 LAB — POCT GLYCOSYLATED HEMOGLOBIN (HGB A1C): Hemoglobin A1C: 6 % — AB (ref 4.0–5.6)

## 2023-03-03 LAB — COMPREHENSIVE METABOLIC PANEL
ALT: 16 U/L (ref 0–35)
AST: 16 U/L (ref 0–37)
Albumin: 4.3 g/dL (ref 3.5–5.2)
Alkaline Phosphatase: 118 U/L — ABNORMAL HIGH (ref 39–117)
BUN: 17 mg/dL (ref 6–23)
CO2: 30 meq/L (ref 19–32)
Calcium: 8.7 mg/dL (ref 8.4–10.5)
Chloride: 100 meq/L (ref 96–112)
Creatinine, Ser: 0.93 mg/dL (ref 0.40–1.20)
GFR: 60.96 mL/min (ref >60.00)
Glucose, Bld: 109 mg/dL — ABNORMAL HIGH (ref 70–99)
Potassium: 3.6 meq/L (ref 3.5–5.1)
Sodium: 137 meq/L (ref 135–145)
Total Bilirubin: 0.5 mg/dL (ref 0.2–1.2)
Total Protein: 7.3 g/dL (ref 6.0–8.3)

## 2023-03-03 LAB — LIPID PANEL
Cholesterol: 176 mg/dL (ref 0–200)
HDL: 70 mg/dL (ref >39.00)
LDL Cholesterol: 87 mg/dL (ref 0–99)
NonHDL: 106.14
Total CHOL/HDL Ratio: 3
Triglycerides: 98 mg/dL (ref 0.0–149.0)
VLDL: 19.6 mg/dL (ref 0.0–40.0)

## 2023-03-03 MED ORDER — CLONAZEPAM 0.5 MG PO TABS: ORAL_TABLET | ORAL | 0 refills | Status: DC

## 2023-03-03 MED ORDER — ATORVASTATIN CALCIUM 40 MG PO TABS: 40.0000 mg | ORAL_TABLET | Freq: Every day | ORAL | 3 refills | Status: DC

## 2023-03-03 MED ORDER — CARVEDILOL 3.125 MG PO TABS: 3.1250 mg | ORAL_TABLET | Freq: Two times a day (BID) | ORAL | 1 refills | Status: DC

## 2023-03-03 MED ORDER — OZEMPIC (0.25 OR 0.5 MG/DOSE) 2 MG/3ML ~~LOC~~ SOPN: 0.5000 mg | PEN_INJECTOR | SUBCUTANEOUS | 2 refills | Status: DC

## 2023-03-03 NOTE — Assessment & Plan Note (Signed)
 Chronic issue.  Stable.  She can continue clonazepam  1 mg by mouth daily as needed.

## 2023-03-03 NOTE — Assessment & Plan Note (Signed)
 She will continue treatment through orthopedics.

## 2023-03-03 NOTE — Progress Notes (Signed)
 Nelly Banco, MD Phone: [phone]  Nicole Washington is a 73 y.o. female who presents today for f/u.  DIABETES Disease Monitoring: Blood Sugar ranges-not checking Polyuria/phagia/dipsia- no      Optho- reports saw [facility] Medications: Compliance- taking ozempic  Hypoglycemic symptoms- no  Anxiety: Patient notes her anxiety was worse last year though has been progressively improving.  She is on Cymbalta.  She follows with psychiatry this afternoon.  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  Neck pain: Patient has been seeing orthopedics for this and has been doing some physical therapy.   Social History   Tobacco Use  Smoking Status Former   Current packs/day: 0.00   Types: Cigarettes   Quit date: 12/20/1989   Years since quitting: 33.2  Smokeless Tobacco Never    Current Outpatient Medications on File Prior to Visit  Medication Sig Dispense Refill   amitriptyline (ELAVIL) 10 MG tablet amitriptyline 10 mg tablet  Take 1 tablet twice a day by oral route for 90 days.     amLODipine  (NORVASC ) 5 MG tablet TAKE 1 TABLET BY MOUTH EVERY DAY 90 tablet 1   DULoxetine (CYMBALTA) 60 MG capsule Take 60 mg by mouth 2 (two) times daily.     linaclotide  (LINZESS ) 72 MCG capsule Take 1 capsule (72 mcg total) by mouth daily before breakfast. 30 capsule 1   losartan  (COZAAR ) 50 MG tablet TAKE 1 TABLET BY MOUTH EVERY DAY 90 tablet 3   omeprazole  (PRILOSEC) 20 MG capsule TAKE 1 CAPSULE BY MOUTH EVERY DAY 90 capsule 1   pregabalin (LYRICA) 150 MG capsule Take 150 mg by mouth 2 (two) times daily.     sertraline (ZOLOFT) 100 MG tablet Take 100 mg by mouth daily.     traZODone (DESYREL) 100 MG tablet Take 100 mg by mouth at bedtime.     valACYclovir  (VALTREX ) 1000 MG tablet Take 1 tablet (1,000 mg total) by mouth as directed. At onset of fever blister symptoms take 2 tablets po then 12 hours later take 2 tablets po 30 tablet 6    No current facility-administered medications on file prior to visit.     ROS see history of present illness  Objective  Physical Exam Vitals:   03/03/23 1114  BP: 132/72  Temp: 98 F (36.7 C)  SpO2: 95%    BP Readings from Last 3 Encounters:  03/03/23 132/72  11/25/22 118/74  09/16/22 126/74   Wt Readings from Last 3 Encounters:  03/03/23 168 lb 12.8 oz (76.6 kg)  11/25/22 170 lb 3.2 oz (77.2 kg)  09/16/22 170 lb (77.1 kg)    Physical Exam Constitutional:      General: She is not in acute distress.    Appearance: She is not diaphoretic.  Cardiovascular:     Rate and Rhythm: Normal rate and regular rhythm.     Heart sounds: Normal heart sounds.  Pulmonary:     Effort: Pulmonary effort is normal.     Breath sounds: Normal breath sounds.  Skin:    General: Skin is warm and dry.  Neurological:     Mental Status: She is alert.      Assessment/Plan: Please see individual problem list.  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  She can continue clonazepam  1 mg by mouth daily as needed.  Orders: -     clonazePAM ; TAKE 2 TABLETS BY MOUTH DAILY AS NEEDED FOR ANXIETY  Dispense: 60 tablet;  Refill: 0  Type 2 diabetes mellitus without complication, without long-term current use of insulin (HCC) Assessment & Plan: Chronic issue.  Check A1c.  Continue Ozempic  0.5 mg weekly.  Patient declines foot exam.  Orders: -     Ozempic  (0.25 or 0.5 MG/DOSE); Inject 0.5 mg as directed once a week. DIAL AND INJECT UNDER THE SKIN 0.5 MG WEEKLY  Dispense: 3 mL; Refill: 2 -     POCT glycosylated hemoglobin (Hb A1C)  Anxiety and depression Assessment & Plan: Chronic issue.  Stable.  On Cymbalta through psychiatry.  She will continue to see psychiatry.   Neck pain Assessment & Plan: She will continue treatment through orthopedics.   Hyperlipidemia, unspecified hyperlipidemia type -     Lipid panel -     Comprehensive metabolic panel  Other  orders -     Atorvastatin  Calcium ; Take 1 tablet (40 mg total) by mouth daily.  Dispense: 90 tablet; Refill: 3 -     Carvedilol ; Take 1 tablet (3.125 mg total) by mouth 2 (two) times daily with a meal.  Dispense: 180 tablet; Refill: 1    Return in about 3 months (around 06/01/2023) for transfer of care.   Nelly Banco, MD [facility]

## 2023-03-03 NOTE — Assessment & Plan Note (Signed)
 Chronic issue.  Stable.  On Cymbalta through psychiatry.  She will continue to see psychiatry.

## 2023-03-03 NOTE — Assessment & Plan Note (Signed)
 Chronic issue.  Check A1c.  Continue Ozempic  0.5 mg weekly.  Patient declines foot exam.

## 2023-03-04 ENCOUNTER — Other Ambulatory Visit: Payer: Self-pay

## 2023-03-04 DIAGNOSIS — E785 Hyperlipidemia, unspecified: Secondary | ICD-10-CM

## 2023-03-05 ENCOUNTER — Other Ambulatory Visit: Payer: Self-pay | Admitting: Family Medicine

## 2023-03-05 DIAGNOSIS — E7800 Pure hypercholesterolemia, unspecified: Secondary | ICD-10-CM

## 2023-03-05 MED ORDER — ATORVASTATIN CALCIUM 80 MG PO TABS: 80.0000 mg | ORAL_TABLET | Freq: Every day | ORAL | 1 refills | Status: DC

## 2023-03-10 DIAGNOSIS — M47812 Spondylosis without myelopathy or radiculopathy, cervical region: Secondary | ICD-10-CM | POA: Diagnosis not present

## 2023-03-16 ENCOUNTER — Ambulatory Visit: Payer: Self-pay | Admitting: Family Medicine

## 2023-03-16 NOTE — Telephone Encounter (Signed)
Copied from CRM [id number]. Topic: Clinical - Pink Word Triage >> Mar 16, 2023 11:11 AM Samuel Jester B wrote: Reason for Triage: Pt stated that she has had a cold, sinus issues in chest area, and coughing for about a week and a half now.  Chief Complaint: Cough Symptoms: Cough and congestion Frequency: 1-2 weeks Pertinent Negatives: Patient denies fever and SOB Disposition: [] ED /[] Urgent Care (no appt availability in office) / [x] Appointment(In office/virtual)/ []  [facility]/ [] Home Care/ [] Refused Recommended Disposition /[] [facility]/ []  Follow-up with PCP Additional Notes: Patient called in to report cold symptoms that have been ongoing for a week and a half. Patient stated she is experiencing a cough that produces brown phlegm and nasal discharge. Patient denied fever, SOB, and chest pain. Advised patient to be seen in the office within 3 days. No availability with PCP. Scheduled patient in the office for Thursday afternoon. Advised patient to call back if symptoms worsen. Patient complied.   Reason for Disposition  [1] Nasal discharge AND [2] present > 10 days  Answer Assessment - Initial Assessment Questions 1. ONSET: "When did the cough begin?"      Symptoms started a week and a half ago, cough started within past few days 2. SEVERITY: "How bad is the cough today?"      States she is not coughing all the time, but produces phlegm every time she coughs 3. SPUTUM: "Describe the color of your sputum" (none, dry cough; clear, white, yellow, green)     Brown 4. HEMOPTYSIS: "Are you coughing up any blood?" If so ask: "How much?" (flecks, streaks, tablespoons, etc.)     Denies 5. DIFFICULTY BREATHING: "Are you having difficulty breathing?" If Yes, ask: "How bad is it?" (e.g., mild, moderate, severe)    - MILD: No SOB at rest, mild SOB with walking, speaks normally in sentences, can lie down, no retractions, pulse < 100.    - MODERATE: SOB at rest, SOB with minimal exertion  and prefers to sit, cannot lie down flat, speaks in phrases, mild retractions, audible wheezing, pulse 100-120.    - SEVERE: Very SOB at rest, speaks in single words, struggling to breathe, sitting hunched forward, retractions, pulse > 120      Mild- nasal congestion makes it hard to breath 6. FEVER: "Do you have a fever?" If Yes, ask: "What is your temperature, how was it measured, and when did it start?"     Denies 7. CARDIAC HISTORY: "Do you have any history of heart disease?" (e.g., heart attack, congestive heart failure)      Denies 8. LUNG HISTORY: "Do you have any history of lung disease?"  (e.g., pulmonary embolus, asthma, emphysema)     Denies 10. OTHER SYMPTOMS: "Do you have any other symptoms?" (e.g., runny nose, wheezing, chest pain)       Denies wheezing, denies chest pain, yellow nasal discharge  Protocols used: Cough - Acute Productive-A-AH

## 2023-03-18 ENCOUNTER — Ambulatory Visit: Payer: Medicare Other | Admitting: Nurse Practitioner

## 2023-03-18 ENCOUNTER — Encounter: Payer: Self-pay | Admitting: Nurse Practitioner

## 2023-03-18 VITALS — BP 124/80 | HR 90 | Temp 97.9°F | Ht 62.0 in | Wt 167.2 lb

## 2023-03-18 DIAGNOSIS — J069 Acute upper respiratory infection, unspecified: Secondary | ICD-10-CM | POA: Insufficient documentation

## 2023-03-18 MED ORDER — AMOXICILLIN-POT CLAVULANATE 875-125 MG PO TABS: 1.0000 | ORAL_TABLET | Freq: Two times a day (BID) | ORAL | 0 refills | Status: DC

## 2023-03-18 MED ORDER — HYDROCODONE BIT-HOMATROP MBR 5-1.5 MG/5ML PO SOLN: 5.0000 mL | Freq: Every evening | ORAL | 0 refills | Status: DC | PRN

## 2023-03-18 NOTE — Patient Instructions (Signed)
VISIT SUMMARY:  You came in today because you have been experiencing a persistent cough and cold symptoms for almost two weeks. You have been using over-the-counter medications and cough drops, which have provided some relief, especially at night. You also mentioned using an antihistamine and nasal spray to manage postnasal drip and nasal congestion. You have not had a fever or fatigue, and your sore throat resolved after a few days. Your lung sounds were clear during the examination.  YOUR PLAN:  -UPPER RESPIRATORY INFECTION: An upper respiratory infection that affects the nose, throat, and airways. We will start you on antibiotic therapy to help clear the infection. Continue using the antihistamine and nasal spray as needed to manage your symptoms. If your symptoms do not improve, we may need to consider a chest x-ray.  -COUGH: A cough is a reflex that helps clear your airways of irritants. Since your cough is persistent and especially bothersome at night, we will prescribe Hycodan for you to use at bedtime to help manage the cough and facilitate sleep. Additionally, please increase your fluid intake to help soothe your throat and keep your airways moist.  INSTRUCTIONS:  Please monitor your symptoms and report back if they are not improving for further evaluation.

## 2023-03-18 NOTE — Assessment & Plan Note (Signed)
Pt is afebrile, non toxic appearing, without respiratoty distress.  Symptoms present for 2  weeks with no sign of resolution, will treat with Augmentin and hycodan.  Increase fluid intake, rest. Advised to continue take plain Mucinex and take OTC antihistamine for PND. Will let us know if symptoms not improving

## 2023-03-18 NOTE — Progress Notes (Signed)
Established Patient Office Visit  Subjective:  Patient ID: Nicole Washington, female    DOB: May 01, 1950  Age: 73 y.o. MRN: [id number]  CC:  Chief Complaint  Patient presents with   Cough    Cough & cold symptoms x 10 + days    HPI  Nicole Washington presents with a persistent cough and cold symptoms for almost two weeks.  The patient has been experiencing a persistent cough for almost two weeks, occurring throughout the day and accompanied by expectoration. Over-the-counter medications and cough drops have provided some relief, particularly at night, allowing them to sleep. They have not felt the need to take stronger medications.  They are using an antihistamine, particularly at night, and a nasal spray to manage symptoms of postnasal drip, which sometimes causes nasal congestion, especially on one side. No ear pain or congestion is reported.  Initially, they had a sore throat for about three to four days, which has since resolved. No fever or fatigue has been experienced during this period.   Past Medical History:  Diagnosis Date   Actinic keratosis    Basal cell carcinoma    Legs, L forehead, txted in past in [place]   Burning mouth syndrome    CAD (coronary artery disease)    Cancer (HCC)    basal and squamous cell   Depression    GERD (gastroesophageal reflux disease)    Hyperlipidemia    Hypertension    Pre-diabetes    Sleep apnea    CPAP   Squamous cell carcinoma of skin    R hand dorsum, L forehead, txted in [place]   Thyroid disease     Past Surgical History:  Procedure Laterality Date   BREAST BIOPSY Right yrs ago   benign, marker placed   CATARACT EXTRACTION W/PHACO Left 02/23/2022   Procedure: CATARACT EXTRACTION PHACO AND INTRAOCULAR LENS PLACEMENT (IOC) LEFT  3.78  00:31.8;  Surgeon: [name], MD;  Location: [facility];  Service: Ophthalmology;  Laterality: Left;  Sleep apnea   CATARACT EXTRACTION W/PHACO Right 03/09/2022   Procedure: CATARACT  EXTRACTION PHACO AND INTRAOCULAR LENS PLACEMENT (IOC) RIGHT  4.31  00:37.6;  Surgeon: [name], MD;  Location: [facility];  Service: Ophthalmology;  Laterality: Right;  Sleep apnea   CESAREAN SECTION     COLONOSCOPY WITH PROPOFOL N/A 08/27/2022   Procedure: COLONOSCOPY WITH PROPOFOL;  Surgeon: [name], MD;  Location: [facility];  Service: Gastroenterology;  Laterality: N/A;   POLYPECTOMY  08/27/2022   Procedure: POLYPECTOMY;  Surgeon: [name], MD;  Location: [facility] [place] [facility];  Service: Gastroenterology;;    Family History  Problem Relation Age of Onset   Cerebral aneurysm Mother 18   COPD Father    Cancer Brother        ? colon   Lupus Other    Heart Problems Paternal Grandfather    Breast cancer Neg Hx     Social History   Socioeconomic History   Marital status: Married    Spouse name: Not on file   Number of children: Not on file   Years of education: Not on file   Highest education level: Not on file  Occupational History   Not on file  Tobacco Use   Smoking status: Former    Current packs/day: 0.00    Types: Cigarettes    Quit date: 12/20/1989    Years since quitting: 33.2   Smokeless tobacco: Never  Vaping Use   Vaping status: Never Used  Substance and Sexual Activity   Alcohol use: Yes    Comment: 4 times a year   Drug use: No   Sexual activity: Not on file  Other Topics Concern   Not on file  Social History Narrative   Not on file   Social Drivers of Health   Financial Resource Strain: Low Risk  (02/22/2023)   Overall Financial Resource Strain (CARDIA)    Difficulty of Paying Living Expenses: Not hard at all  Food Insecurity: Patient Declined (02/22/2023)   Hunger Vital Sign    Worried About Running Out of Food in the Last Year: Patient declined    Ran Out of Food in the Last Year: Patient declined  Transportation Needs: Patient Declined (02/22/2023)   PRAPARE - Administrator, Civil Service (Medical): Patient declined     Lack of Transportation (Non-Medical): Patient declined  Physical Activity: Unknown (02/22/2023)   Exercise Vital Sign    Days of Exercise per Week: 0 days    Minutes of Exercise per Session: Not on file  Stress: Stress Concern Present (02/22/2023)   [employer] of Occupational Health - Occupational Stress Questionnaire    Feeling of Stress : To some extent  Social Connections: Socially Integrated (02/22/2023)   Social Connection and Isolation Panel [NHANES]    Frequency of Communication with Friends and Family: Three times a week    Frequency of Social Gatherings with Friends and Family: Patient declined    Attends Religious Services: More than 4 times per year    Active Member of [employer] or Organizations: Yes    Attends Banker Meetings: Patient declined    Marital Status: Married  Catering manager Violence: Not At Risk (07/17/2020)   Humiliation, Afraid, Rape, and Kick questionnaire    Fear of Current or Ex-Partner: No    Emotionally Abused: No    Physically Abused: No    Sexually Abused: No     Outpatient Medications Prior to Visit  Medication Sig Dispense Refill   amitriptyline (ELAVIL) 10 MG tablet amitriptyline 10 mg tablet  Take 1 tablet twice a day by oral route for 90 days.     amLODipine (NORVASC) 5 MG tablet TAKE 1 TABLET BY MOUTH EVERY DAY 90 tablet 1   atorvastatin (LIPITOR) 80 MG tablet Take 1 tablet (80 mg total) by mouth daily. 90 tablet 1   carvedilol (COREG) 3.125 MG tablet Take 1 tablet (3.125 mg total) by mouth 2 (two) times daily with a meal. 180 tablet 1   clonazePAM (KLONOPIN) 0.5 MG tablet TAKE 2 TABLETS BY MOUTH DAILY AS NEEDED FOR ANXIETY 60 tablet 0   DULoxetine (CYMBALTA) 60 MG capsule Take 60 mg by mouth 2 (two) times daily.     linaclotide (LINZESS) 72 MCG capsule Take 1 capsule (72 mcg total) by mouth daily before breakfast. 30 capsule 1   losartan (COZAAR) 50 MG tablet TAKE 1 TABLET BY MOUTH EVERY DAY 90 tablet 3   omeprazole (PRILOSEC)  20 MG capsule TAKE 1 CAPSULE BY MOUTH EVERY DAY 90 capsule 1   pregabalin (LYRICA) 150 MG capsule Take 150 mg by mouth 2 (two) times daily.     Semaglutide,0.25 or 0.5MG /DOS, (OZEMPIC, 0.25 OR 0.5 MG/DOSE,) 2 MG/3ML SOPN Inject 0.5 mg as directed once a week. DIAL AND INJECT UNDER THE SKIN 0.5 MG WEEKLY 3 mL 2   sertraline (ZOLOFT) 100 MG tablet Take 100 mg by mouth daily.     traZODone (DESYREL) 100 MG tablet Take 100  mg by mouth at bedtime.     valACYclovir (VALTREX) 1000 MG tablet Take 1 tablet (1,000 mg total) by mouth as directed. At onset of fever blister symptoms take 2 tablets po then 12 hours later take 2 tablets po 30 tablet 6   No facility-administered medications prior to visit.    Allergies  Allergen Reactions   Codeine Itching   Ivp Dye [Iodinated Contrast Media] Itching   Pneumovax 23 [Pneumococcal Vac Polyvalent] Swelling    Patient had the vaccine on 09/18/2019 and stated her arm is swollen as large as a orange and red and sore.  [name],cma     ROS Review of Systems  Respiratory:  Positive for cough.    Negative unless indicated in HPI.    Objective:    Physical Exam Constitutional:      Appearance: Normal appearance.  HENT:     Right Ear: Tympanic membrane normal. Tympanic membrane is not erythematous.     Left Ear: Tympanic membrane normal. Tympanic membrane is not erythematous.     Nose:     Right Turbinates: Not enlarged.     Left Turbinates: Not enlarged.     Right Sinus: No maxillary sinus tenderness or frontal sinus tenderness.     Left Sinus: No maxillary sinus tenderness or frontal sinus tenderness.     Mouth/Throat:     Mouth: Mucous membranes are moist.     Pharynx: No pharyngeal swelling, oropharyngeal exudate or posterior oropharyngeal erythema.     Tonsils: No tonsillar exudate.  Cardiovascular:     Rate and Rhythm: Normal rate and regular rhythm.  Pulmonary:     Effort: Pulmonary effort is normal.     Breath sounds: Normal breath sounds. No  stridor. No wheezing.  Neurological:     General: No focal deficit present.     Mental Status: She is alert and oriented to person, place, and time. Mental status is at baseline.  Psychiatric:        Mood and Affect: Mood normal.        Behavior: Behavior normal.        Thought Content: Thought content normal.        Judgment: Judgment normal.     BP 124/80   Pulse 90   Temp 97.9 F (36.6 C)   Ht 5\' 2"  (1.575 m)   Wt 167 lb 3.2 oz (75.8 kg)   SpO2 95%   BMI 30.58 kg/m  Wt Readings from Last 3 Encounters:  03/18/23 167 lb 3.2 oz (75.8 kg)  03/03/23 168 lb 12.8 oz (76.6 kg)  11/25/22 170 lb 3.2 oz (77.2 kg)     Health Maintenance  Topic Date Due   OPHTHALMOLOGY EXAM  Never done   DTaP/Tdap/Td (1 - Tdap) Never done   Medicare Annual Wellness (AWV)  07/17/2021   FOOT EXAM  04/03/2023 (Originally 08/17/1960)   COVID-19 Vaccine (4 - 2024-25 season) 04/03/2023 (Originally 10/18/2022)   HEMOGLOBIN A1C  08/31/2023   Diabetic kidney evaluation - Urine ACR  09/08/2023   Diabetic kidney evaluation - eGFR measurement  03/02/2024   MAMMOGRAM  07/02/2024   Colonoscopy  08/26/2032   Pneumonia Vaccine 65+ Years old  Completed   INFLUENZA VACCINE  Completed   DEXA SCAN  Completed   Hepatitis C Screening  Completed   Zoster Vaccines- Shingrix  Completed   HPV VACCINES  Aged Out    There are no preventive care reminders to display for this patient.  Lab Results  Component  Value Date   TSH 2.19 07/08/2022   Lab Results  Component Value Date   WBC 8.2 11/25/2022   HGB 13.8 11/25/2022   HCT 41.4 11/25/2022   MCV 84.1 11/25/2022   PLT 358.0 11/25/2022   Lab Results  Component Value Date   NA 137 03/03/2023   K 3.6 03/03/2023   CO2 30 03/03/2023   GLUCOSE 109 (H) 03/03/2023   BUN 17 03/03/2023   CREATININE 0.93 03/03/2023   BILITOT 0.5 03/03/2023   ALKPHOS 118 (H) 03/03/2023   AST 16 03/03/2023   ALT 16 03/03/2023   PROT 7.3 03/03/2023   ALBUMIN 4.3 03/03/2023   CALCIUM  8.7 03/03/2023   EGFR 68 11/27/2022   GFR 60.96 03/03/2023   Lab Results  Component Value Date   CHOL 176 03/03/2023   Lab Results  Component Value Date   HDL 70.00 03/03/2023   Lab Results  Component Value Date   LDLCALC 87 03/03/2023   Lab Results  Component Value Date   TRIG 98.0 03/03/2023   Lab Results  Component Value Date   CHOLHDL 3 03/03/2023   Lab Results  Component Value Date   HGBA1C 6.0 (A) 03/03/2023      Assessment & Plan:  URI with cough and congestion Assessment & Plan: Pt is afebrile, non toxic appearing, without respiratoty distress.  Symptoms present for 2  weeks with no sign of resolution, will treat with Augmentin and hycodan.  Increase fluid intake, rest. Advised to continue take plain Mucinex and take OTC antihistamine for PND. Will let us know if symptoms not improving      Orders: -     Amoxicillin-Pot Clavulanate; Take 1 tablet by mouth 2 (two) times daily.  Dispense: 20 tablet; Refill: 0 -     HYDROcodone Bit-Homatrop MBr; Take 5 mLs by mouth at bedtime as needed for cough.  Dispense: 30 mL; Refill: 0    Follow-up: No follow-ups on file.   [name], NP

## 2023-03-19 DIAGNOSIS — M47812 Spondylosis without myelopathy or radiculopathy, cervical region: Secondary | ICD-10-CM | POA: Diagnosis not present

## 2023-03-20 ENCOUNTER — Other Ambulatory Visit: Payer: Self-pay | Admitting: Family Medicine

## 2023-03-20 DIAGNOSIS — E119 Type 2 diabetes mellitus without complications: Secondary | ICD-10-CM

## 2023-03-23 DIAGNOSIS — M25511 Pain in right shoulder: Secondary | ICD-10-CM | POA: Diagnosis not present

## 2023-03-23 DIAGNOSIS — M47812 Spondylosis without myelopathy or radiculopathy, cervical region: Secondary | ICD-10-CM | POA: Diagnosis not present

## 2023-03-23 DIAGNOSIS — M25512 Pain in left shoulder: Secondary | ICD-10-CM | POA: Diagnosis not present

## 2023-04-09 ENCOUNTER — Ambulatory Visit: Payer: Medicare Other

## 2023-04-09 ENCOUNTER — Ambulatory Visit: Payer: Medicare Other | Admitting: Family Medicine

## 2023-04-09 ENCOUNTER — Encounter: Payer: Self-pay | Admitting: Family Medicine

## 2023-04-09 VITALS — BP 110/80 | HR 86 | Temp 98.6°F | Wt 172.8 lb

## 2023-04-09 DIAGNOSIS — R058 Other specified cough: Secondary | ICD-10-CM | POA: Diagnosis not present

## 2023-04-09 DIAGNOSIS — R059 Cough, unspecified: Secondary | ICD-10-CM | POA: Diagnosis not present

## 2023-04-09 DIAGNOSIS — R918 Other nonspecific abnormal finding of lung field: Secondary | ICD-10-CM | POA: Diagnosis not present

## 2023-04-09 MED ORDER — AMOXICILLIN-POT CLAVULANATE 875-125 MG PO TABS: 1.0000 | ORAL_TABLET | Freq: Two times a day (BID) | ORAL | 0 refills | Status: DC

## 2023-04-09 NOTE — Assessment & Plan Note (Signed)
 Patient with cough after having reflux.  Cough is productive.  There is concern that this could represent an aspiration event.  We will get a chest x-ray today.  We will provide empiric coverage with Augmentin 1 tablet twice daily for a week.  If not improving she will let us know.  Advised on the risk of diarrhea with the Augmentin.

## 2023-04-09 NOTE — Progress Notes (Signed)
 Marikay Alar, MD Phone: [phone]  Nicole Washington is a 73 y.o. female who presents today for same day visit.   Cough: Patient notes she had reflux in the middle the night Tuesday.  Notes her throat was on fire and her esophagus was on fire.  She subsequently started to cough.  She has since been coughing up green/yellow mucus.  She does not feel sick.  She is hoarse.  He has chronic postnasal drip.  No chest or head congestion.  No fever.  No sore throat.  Patient notes she had eaten stuff that she may be should not have that night.  Social History   Tobacco Use  Smoking Status Former   Current packs/day: 0.00   Types: Cigarettes   Quit date: 12/20/1989   Years since quitting: 33.3  Smokeless Tobacco Never    Current Outpatient Medications on File Prior to Visit  Medication Sig Dispense Refill   amitriptyline (ELAVIL) 10 MG tablet amitriptyline 10 mg tablet  Take 1 tablet twice a day by oral route for 90 days.     amLODipine (NORVASC) 5 MG tablet TAKE 1 TABLET BY MOUTH EVERY DAY 90 tablet 1   atorvastatin (LIPITOR) 80 MG tablet Take 1 tablet (80 mg total) by mouth daily. 90 tablet 1   carvedilol (COREG) 3.125 MG tablet Take 1 tablet (3.125 mg total) by mouth 2 (two) times daily with a meal. 180 tablet 1   clonazePAM (KLONOPIN) 0.5 MG tablet TAKE 2 TABLETS BY MOUTH DAILY AS NEEDED FOR ANXIETY 60 tablet 0   DULoxetine (CYMBALTA) 60 MG capsule Take 60 mg by mouth 2 (two) times daily.     HYDROcodone bit-homatropine (HYCODAN) 5-1.5 MG/5ML syrup Take 5 mLs by mouth at bedtime as needed for cough. 30 mL 0   linaclotide (LINZESS) 72 MCG capsule Take 1 capsule (72 mcg total) by mouth daily before breakfast. 30 capsule 1   losartan (COZAAR) 50 MG tablet TAKE 1 TABLET BY MOUTH EVERY DAY 90 tablet 3   omeprazole (PRILOSEC) 20 MG capsule TAKE 1 CAPSULE BY MOUTH EVERY DAY 90 capsule 1   OZEMPIC, 0.25 OR 0.5 MG/DOSE, 2 MG/3ML SOPN DIAL AND INJECT UNDER THE SKIN 0.5 MG WEEKLY 3 mL 2   pregabalin  (LYRICA) 150 MG capsule Take 150 mg by mouth 2 (two) times daily.     sertraline (ZOLOFT) 100 MG tablet Take 100 mg by mouth daily.     traZODone (DESYREL) 100 MG tablet Take 100 mg by mouth at bedtime.     valACYclovir (VALTREX) 1000 MG tablet Take 1 tablet (1,000 mg total) by mouth as directed. At onset of fever blister symptoms take 2 tablets po then 12 hours later take 2 tablets po 30 tablet 6   No current facility-administered medications on file prior to visit.     ROS see history of present illness  Objective  Physical Exam Vitals:   04/09/23 1141  BP: 110/80  Pulse: 86  Temp: 98.6 F (37 C)  SpO2: 93%    BP Readings from Last 3 Encounters:  04/09/23 110/80  03/18/23 124/80  03/03/23 132/72   Wt Readings from Last 3 Encounters:  04/09/23 172 lb 12.8 oz (78.4 kg)  03/18/23 167 lb 3.2 oz (75.8 kg)  03/03/23 168 lb 12.8 oz (76.6 kg)    Physical Exam Constitutional:      General: She is not in acute distress.    Appearance: She is not diaphoretic.  HENT:     Right Ear:  Tympanic membrane normal.     Left Ear: Tympanic membrane normal.     Mouth/Throat:     Mouth: Mucous membranes are moist.     Pharynx: Oropharynx is clear.  Cardiovascular:     Rate and Rhythm: Normal rate and regular rhythm.     Heart sounds: Normal heart sounds.  Pulmonary:     Effort: Pulmonary effort is normal.     Breath sounds: Normal breath sounds.  Skin:    General: Skin is warm and dry.  Neurological:     Mental Status: She is alert.      Assessment/Plan: Please see individual problem list.  Cough, unspecified type Assessment & Plan: Patient with cough after having reflux.  Cough is productive.  There is concern that this could represent an aspiration event.  We will get a chest x-ray today.  We will provide empiric coverage with Augmentin 1 tablet twice daily for a week.  If not improving she will let us know.  Advised on the risk of diarrhea with the Augmentin.  Orders: -      Amoxicillin-Pot Clavulanate; Take 1 tablet by mouth 2 (two) times daily.  Dispense: 14 tablet; Refill: 0 -     DG Chest 2 View; Future    Return if symptoms worsen or fail to improve.   Marikay Alar, MD [facility]

## 2023-04-14 ENCOUNTER — Encounter: Payer: Self-pay | Admitting: Family Medicine

## 2023-04-15 ENCOUNTER — Other Ambulatory Visit (INDEPENDENT_AMBULATORY_CARE_PROVIDER_SITE_OTHER): Payer: Medicare Other

## 2023-04-15 ENCOUNTER — Encounter: Payer: Self-pay | Admitting: Family

## 2023-04-15 DIAGNOSIS — E785 Hyperlipidemia, unspecified: Secondary | ICD-10-CM | POA: Diagnosis not present

## 2023-04-15 DIAGNOSIS — E7800 Pure hypercholesterolemia, unspecified: Secondary | ICD-10-CM | POA: Diagnosis not present

## 2023-04-16 LAB — HEPATIC FUNCTION PANEL
ALT: 17 U/L (ref 0–35)
AST: 18 U/L (ref 0–37)
Albumin: 4.3 g/dL (ref 3.5–5.2)
Alkaline Phosphatase: 111 U/L (ref 39–117)
Bilirubin, Direct: 0.1 mg/dL (ref 0.0–0.3)
Total Bilirubin: 0.6 mg/dL (ref 0.2–1.2)
Total Protein: 7 g/dL (ref 6.0–8.3)

## 2023-04-16 LAB — LIPID PANEL
Cholesterol: 157 mg/dL (ref 0–200)
HDL: 54.9 mg/dL (ref >39.00)
LDL Cholesterol: 73 mg/dL (ref 0–99)
NonHDL: 102
Total CHOL/HDL Ratio: 3
Triglycerides: 143 mg/dL (ref 0.0–149.0)
VLDL: 28.6 mg/dL (ref 0.0–40.0)

## 2023-04-19 ENCOUNTER — Encounter: Payer: Self-pay | Admitting: Family

## 2023-05-03 ENCOUNTER — Telehealth: Payer: Self-pay | Admitting: Family Medicine

## 2023-05-03 ENCOUNTER — Other Ambulatory Visit: Payer: Self-pay | Admitting: Family Medicine

## 2023-05-03 DIAGNOSIS — K146 Glossodynia: Secondary | ICD-10-CM

## 2023-05-03 NOTE — Telephone Encounter (Unsigned)
 Copied from CRM [id number]. Topic: Clinical - Medication Refill >> May 03, 2023  9:06 AM Randa Ngo wrote: Most Recent Primary Care Visit:  Provider: [facility]  Department: [facility]  Visit Type: LAB  Date: 04/15/2023  Medication: amLODipine (NORVASC) 5 MG tablet clonazePAM (KLONOPIN) 0.5 MG tablet  Has the patient contacted their pharmacy? Yes (Agent: If no, request that the patient contact the pharmacy for the refill. If patient does not wish to contact the pharmacy document the reason why and proceed with request.) (Agent: If yes, when and what did the pharmacy advise?)  Is this the correct pharmacy for this prescription? Yes If no, delete pharmacy and type the correct one.  This is the patient's preferred pharmacy:  [id number] - [address] [place] [address] Phone: [phone] Fax: [phone]   Has the prescription been filled recently? No  Is the patient out of the medication? No, patient has several days left of the Amlodipine, but needs doctor's approval for the Clonazepam.  Has the patient been seen for an appointment in the last year OR does the patient have an upcoming appointment? Yes  Can we respond through MyChart? Yes  Agent: Please be advised that Rx refills may take up to 3 business days. We ask that you follow-up with your pharmacy.

## 2023-05-03 NOTE — Telephone Encounter (Signed)
 Prescription Request  05/03/2023  LOV: 04/09/2023  What is the name of the medication or equipment?  clonazePAM (KLONOPIN) 0.5 MG tablet  amLODipine (NORVASC) 5 MG tablet   Have you contacted your pharmacy to request a refill? No   Which pharmacy would you like this sent to?    [id number] [address] [place] [address] Phone: [phone] Fax: [phone]  Patient notified that their request is being sent to the clinical staff for review and that they should receive a response within 2 business days.   Please advise at Mobile [phone] (mobile)

## 2023-05-04 ENCOUNTER — Other Ambulatory Visit: Payer: Self-pay | Admitting: Family

## 2023-05-04 DIAGNOSIS — K146 Glossodynia: Secondary | ICD-10-CM

## 2023-05-04 MED ORDER — AMLODIPINE BESYLATE 5 MG PO TABS: 5.0000 mg | ORAL_TABLET | Freq: Every day | ORAL | 1 refills | Status: DC

## 2023-05-04 MED ORDER — CLONAZEPAM 0.5 MG PO TABS: ORAL_TABLET | ORAL | 0 refills | Status: DC

## 2023-05-04 MED ORDER — AMLODIPINE BESYLATE 5 MG PO TABS
5.0000 mg | ORAL_TABLET | Freq: Every day | ORAL | 1 refills | Status: DC
Start: 2023-05-04 — End: 2023-05-04

## 2023-05-19 ENCOUNTER — Encounter: Payer: Self-pay | Admitting: Podiatry

## 2023-05-19 ENCOUNTER — Ambulatory Visit (INDEPENDENT_AMBULATORY_CARE_PROVIDER_SITE_OTHER): Admitting: Podiatry

## 2023-05-19 VITALS — Ht 62.0 in | Wt 172.8 lb

## 2023-05-19 DIAGNOSIS — L600 Ingrowing nail: Secondary | ICD-10-CM | POA: Diagnosis not present

## 2023-05-19 MED ORDER — NEOMYCIN-POLYMYXIN-HC 3.5-10000-1 OT SUSP: OTIC | 0 refills | Status: DC

## 2023-05-19 NOTE — Patient Instructions (Signed)

## 2023-05-20 NOTE — Progress Notes (Signed)
  Subjective:  Patient ID: Nicole Washington, female    DOB: [birth year],  MRN: [id number]  Chief Complaint  Patient presents with   Nail Problem    Pt is here due to bilateral great toenails, pt states that it feels like her toenails are growing into her skin and is causing pain.    73 y.o. female presents with the above complaint. History confirmed with patient.   Objective:  Physical Exam: warm, good capillary refill, no trophic changes or ulcerative lesions, normal DP and PT pulses, normal sensory exam, and medial ingrown nails without paronychia bilateral hallux.  Assessment:   1. Ingrowing right great toenail   2. Ingrowing left great toenail      Plan:  Patient was evaluated and treated and all questions answered.     Ingrown Nail, bilaterally -Patient elects to proceed with minor surgery to remove ingrown toenail today. Consent reviewed and signed by patient. -Ingrown nail excised. See procedure note. -Educated on post-procedure care including soaking. Written instructions provided and reviewed. -Rx for Cortisporin sent to pharmacy. -Advised on signs and symptoms of infection developing.  We discussed that the phenol likely will create some redness and edema and tenderness around the nailbed as long as it is localized this is to be expected.  Will return as needed if any infection signs develop  Procedure: Excision of Ingrown Toenail Location: Bilateral 1st toe medial nail borders. Anesthesia: Lidocaine 1% plain; 1.5 mL and Marcaine 0.5% plain; 1.5 mL, digital block. Skin Prep: Betadine. Dressing: Silvadene; telfa; dry, sterile, compression dressing. Technique: Following skin prep, the toe was exsanguinated and a tourniquet was secured at the base of the toe. The affected nail border was freed, split with a nail splitter, and excised. Chemical matrixectomy was then performed with phenol and irrigated out with alcohol. The tourniquet was then removed and sterile dressing  applied. Disposition: Patient tolerated procedure well.    Return if symptoms worsen or fail to improve.

## 2023-06-16 ENCOUNTER — Other Ambulatory Visit: Payer: Self-pay

## 2023-06-16 DIAGNOSIS — E119 Type 2 diabetes mellitus without complications: Secondary | ICD-10-CM

## 2023-06-16 NOTE — Telephone Encounter (Signed)
 Copied from CRM [id number]. Topic: Clinical - Medication Refill >> Jun 16, 2023 11:17 AM Keitha Pata L wrote: Most Recent Primary Care Visit:  Provider: [facility]  Department: [facility]  Visit Type: LAB  Date: 04/15/2023  Medication: OZEMPIC , 0.25 OR 0.5 MG/DOSE, 2 MG/3ML SOPN  Has the patient contacted their pharmacy? Yes (Agent: If no, request that the patient contact the pharmacy for the refill. If patient does not wish to contact the pharmacy document the reason why and proceed with request.) (Agent: If yes, when and what did the pharmacy advise?)  Is this the correct pharmacy for this prescription? Yes If no, delete pharmacy and type the correct one.  This is the patient's preferred pharmacy:  [id number] [place] [facility] - [address] [place] [address] Phone: [phone] Fax: [phone]   Has the prescription been filled recently? No  Is the patient out of the medication? Yes  Has the patient been seen for an appointment in the last year OR does the patient have an upcoming appointment? Yes  Can we respond through MyChart? Yes  Agent: Please be advised that Rx refills may take up to 3 business days. We ask that you follow-up with your pharmacy.

## 2023-06-17 ENCOUNTER — Telehealth: Payer: Self-pay | Admitting: Family

## 2023-06-17 ENCOUNTER — Telehealth: Payer: Self-pay

## 2023-06-17 ENCOUNTER — Other Ambulatory Visit: Payer: Self-pay

## 2023-06-17 DIAGNOSIS — F411 Generalized anxiety disorder: Secondary | ICD-10-CM | POA: Diagnosis not present

## 2023-06-17 DIAGNOSIS — F39 Unspecified mood [affective] disorder: Secondary | ICD-10-CM | POA: Diagnosis not present

## 2023-06-17 DIAGNOSIS — E119 Type 2 diabetes mellitus without complications: Secondary | ICD-10-CM

## 2023-06-17 DIAGNOSIS — F5105 Insomnia due to other mental disorder: Secondary | ICD-10-CM | POA: Diagnosis not present

## 2023-06-17 DIAGNOSIS — F4312 Post-traumatic stress disorder, chronic: Secondary | ICD-10-CM | POA: Diagnosis not present

## 2023-06-17 MED ORDER — OZEMPIC (0.25 OR 0.5 MG/DOSE) 2 MG/3ML ~~LOC~~ SOPN: PEN_INJECTOR | SUBCUTANEOUS | 0 refills | Status: DC

## 2023-06-17 NOTE — Telephone Encounter (Signed)
 Pt requested med yesterday. Has TOC appt 6/5. RN called CAL. CAL confirmed the med will be able to be refilled before her TOC appt.  Copied from CRM [id number]. Topic: Clinical - Prescription Issue >> Jun 17, 2023 11:42 AM Marlan Silva wrote: Reason for CRM: Patient states that she called in yesterday for a refill on her OZEMPIC , 0.25 OR 0.5 MG/DOSE, 2 MG/3ML SOPN patient is completely out of her medication and is asking it gets refilled today.

## 2023-06-17 NOTE — Telephone Encounter (Signed)
 Prescription Request  06/17/2023  LOV: Visit date not found  What is the name of the medication or equipment? OZEMPIC , 0.25 OR 0.5 MG/DOSE, 2 MG/3ML SOPN   Have you contacted your pharmacy to request a refill? No   Which pharmacy would you like this sent to?    [id number] [place],  - [address] [place] [address] Phone: [phone] Fax: [phone]  Patient notified that their request is being sent to the clinical staff for review and that they should receive a response within 2 business days.   Please advise at Mobile [phone] (mobile)

## 2023-06-17 NOTE — Telephone Encounter (Signed)
 Medication sent.

## 2023-06-17 NOTE — Telephone Encounter (Signed)
 Prescription was sent over to last until patient's scheduled appointment on 07/22/23.

## 2023-07-05 DIAGNOSIS — M542 Cervicalgia: Secondary | ICD-10-CM | POA: Diagnosis not present

## 2023-07-07 ENCOUNTER — Ambulatory Visit (INDEPENDENT_AMBULATORY_CARE_PROVIDER_SITE_OTHER): Admitting: Dermatology

## 2023-07-07 ENCOUNTER — Ambulatory Visit: Admitting: Dermatology

## 2023-07-07 ENCOUNTER — Encounter: Payer: Self-pay | Admitting: Dermatology

## 2023-07-07 DIAGNOSIS — D492 Neoplasm of unspecified behavior of bone, soft tissue, and skin: Secondary | ICD-10-CM

## 2023-07-07 DIAGNOSIS — W908XXA Exposure to other nonionizing radiation, initial encounter: Secondary | ICD-10-CM

## 2023-07-07 DIAGNOSIS — L578 Other skin changes due to chronic exposure to nonionizing radiation: Secondary | ICD-10-CM | POA: Diagnosis not present

## 2023-07-07 DIAGNOSIS — L814 Other melanin hyperpigmentation: Secondary | ICD-10-CM | POA: Diagnosis not present

## 2023-07-07 DIAGNOSIS — B079 Viral wart, unspecified: Secondary | ICD-10-CM | POA: Diagnosis not present

## 2023-07-07 NOTE — Progress Notes (Signed)
   Follow-Up Visit   Subjective  Nicole Washington is a 73 y.o. female who presents for the following: check spots L arm x 2 txted with LN2 03/02/23 with no improvement, no pain The patient has spots, moles and lesions to be evaluated, some may be new or changing and the patient may have concern these could be cancer.   The following portions of the chart were reviewed this encounter and updated as appropriate: medications, allergies, medical history  Review of Systems:  No other skin or systemic complaints except as noted in HPI or Assessment and Plan.  Objective  Well appearing patient in no apparent distress; mood and affect are within normal limits.   A focused examination was performed of the following areas: L forearm  Relevant exam findings are noted in the Assessment and Plan.  L mid forearm 9.29mm firm keratotic pap   L inferior antecubitum 5.73mm keratotic pap    Assessment & Plan   ACTINIC DAMAGE - chronic, secondary to cumulative UV radiation exposure/sun exposure over time - diffuse scaly erythematous macules with underlying dyspigmentation - Recommend daily broad spectrum sunscreen SPF 30+ to sun-exposed areas, reapply every 2 hours as needed.  - Recommend staying in the shade or wearing long sleeves, sun glasses (UVA+UVB protection) and wide brim hats (4-inch brim around the entire circumference of the hat). - Call for new or changing lesions.   LENTIGINES Exam: scattered tan macules Due to sun exposure Treatment Plan: Benign-appearing, observe. Recommend daily broad spectrum sunscreen SPF 30+ to sun-exposed areas, reapply every 2 hours as needed.  Call for any changes  NEOPLASM OF SKIN (2) L mid forearm Epidermal / dermal shaving  Lesion diameter (cm):  1.2 Informed consent: discussed and consent obtained   Patient was prepped and draped in usual sterile fashion: area prepped with alcohol. Anesthesia: the lesion was anesthetized in a standard fashion    Anesthetic:  1% lidocaine  w/ epinephrine  1-100,000 buffered w/ 8.4% NaHCO3 Instrument used: flexible razor blade   Hemostasis achieved with: pressure, aluminum chloride and electrodesiccation   Outcome: patient tolerated procedure well   Post-procedure details: wound care instructions given   Post-procedure details comment:  Ointment and small bandage applied Specimen 1 - Surgical pathology Differential Diagnosis: ISK vs Hypertrophic AK r/o SCC  Check Margins: yes 9.75mm firm keratotic pap  L inferior antecubitum Epidermal / dermal shaving  Lesion diameter (cm):  0.6 Informed consent: discussed and consent obtained   Patient was prepped and draped in usual sterile fashion: area prepped with alcohol. Anesthesia: the lesion was anesthetized in a standard fashion   Anesthetic:  1% lidocaine  w/ epinephrine  1-100,000 buffered w/ 8.4% NaHCO3 Instrument used: flexible razor blade   Hemostasis achieved with: pressure, aluminum chloride and electrodesiccation   Outcome: patient tolerated procedure well   Post-procedure details: wound care instructions given   Post-procedure details comment:  Ointment and small bandage applied Specimen 2 - Surgical pathology Differential Diagnosis: ISK vs Hypertrophic AK r/o SCC  Check Margins: yes 5.35mm keratotic pap   Return in about 8 months (around 03/08/2024) for TBSE, Hx of BCC, Hx of SCC, Hx of AKs.  I, Rollie Clipper, RMA, am acting as scribe for Artemio Larry, MD .   Documentation: I have reviewed the above documentation for accuracy and completeness, and I agree with the above.  Artemio Larry, MD

## 2023-07-07 NOTE — Patient Instructions (Addendum)

## 2023-07-08 ENCOUNTER — Other Ambulatory Visit: Payer: Self-pay | Admitting: Obstetrics and Gynecology

## 2023-07-08 DIAGNOSIS — G894 Chronic pain syndrome: Secondary | ICD-10-CM | POA: Diagnosis not present

## 2023-07-08 DIAGNOSIS — F5222 Female sexual arousal disorder: Secondary | ICD-10-CM | POA: Diagnosis not present

## 2023-07-08 DIAGNOSIS — F419 Anxiety disorder, unspecified: Secondary | ICD-10-CM | POA: Diagnosis not present

## 2023-07-08 DIAGNOSIS — Z1231 Encounter for screening mammogram for malignant neoplasm of breast: Secondary | ICD-10-CM

## 2023-07-08 DIAGNOSIS — F4310 Post-traumatic stress disorder, unspecified: Secondary | ICD-10-CM | POA: Diagnosis not present

## 2023-07-12 ENCOUNTER — Ambulatory Visit: Payer: Medicare Other | Admitting: Family Medicine

## 2023-07-15 LAB — SURGICAL PATHOLOGY

## 2023-07-19 ENCOUNTER — Ambulatory Visit: Payer: Self-pay | Admitting: Dermatology

## 2023-07-19 NOTE — Telephone Encounter (Signed)
-----   Message from Artemio Larry sent at 07/19/2023  3:15 PM EDT ----- 1. Skin, L mid forearm :       VERRUCA VULGARIS, IRRITATED, BASE INVOLVED  2. Skin, L inferior antecubital :       VERRUCA VULGARIS, IRRITATED, BASE INVOLVED    Benign warts, cryotherapy if they recur- please call patient

## 2023-07-19 NOTE — Telephone Encounter (Signed)
 Advised patient biopsies were both benign warts, cryotherapy if they recur.

## 2023-07-21 ENCOUNTER — Ambulatory Visit
Admission: RE | Admit: 2023-07-21 | Discharge: 2023-07-21 | Disposition: A | Discharge status: Home / Self Care | Source: Ambulatory Visit | Attending: Obstetrics and Gynecology | Admitting: Obstetrics and Gynecology

## 2023-07-21 DIAGNOSIS — Z1231 Encounter for screening mammogram for malignant neoplasm of breast: Secondary | ICD-10-CM | POA: Insufficient documentation

## 2023-07-22 ENCOUNTER — Ambulatory Visit (INDEPENDENT_AMBULATORY_CARE_PROVIDER_SITE_OTHER)

## 2023-07-22 VITALS — BP 110/68 | HR 97 | Ht 62.0 in | Wt 172.0 lb

## 2023-07-22 DIAGNOSIS — R251 Tremor, unspecified: Secondary | ICD-10-CM

## 2023-07-22 DIAGNOSIS — F419 Anxiety disorder, unspecified: Secondary | ICD-10-CM | POA: Diagnosis not present

## 2023-07-22 DIAGNOSIS — K219 Gastro-esophageal reflux disease without esophagitis: Secondary | ICD-10-CM

## 2023-07-22 DIAGNOSIS — E119 Type 2 diabetes mellitus without complications: Secondary | ICD-10-CM

## 2023-07-22 DIAGNOSIS — I10 Essential (primary) hypertension: Secondary | ICD-10-CM

## 2023-07-22 DIAGNOSIS — F32A Depression, unspecified: Secondary | ICD-10-CM | POA: Diagnosis not present

## 2023-07-22 DIAGNOSIS — Z79899 Other long term (current) drug therapy: Secondary | ICD-10-CM

## 2023-07-22 DIAGNOSIS — K146 Glossodynia: Secondary | ICD-10-CM

## 2023-07-22 DIAGNOSIS — Z7985 Long-term (current) use of injectable non-insulin antidiabetic drugs: Secondary | ICD-10-CM

## 2023-07-22 DIAGNOSIS — E782 Mixed hyperlipidemia: Secondary | ICD-10-CM

## 2023-07-22 MED ORDER — LOSARTAN POTASSIUM 50 MG PO TABS: 50.0000 mg | ORAL_TABLET | Freq: Every day | ORAL | 3 refills | Status: AC

## 2023-07-22 MED ORDER — ATORVASTATIN CALCIUM 80 MG PO TABS: 80.0000 mg | ORAL_TABLET | Freq: Every day | ORAL | 1 refills | Status: DC

## 2023-07-22 MED ORDER — CARVEDILOL 3.125 MG PO TABS: 3.1250 mg | ORAL_TABLET | Freq: Two times a day (BID) | ORAL | 1 refills | Status: DC

## 2023-07-22 MED ORDER — AMLODIPINE BESYLATE 5 MG PO TABS: 5.0000 mg | ORAL_TABLET | Freq: Every day | ORAL | 1 refills | Status: DC

## 2023-07-22 MED ORDER — OZEMPIC (0.25 OR 0.5 MG/DOSE) 2 MG/3ML ~~LOC~~ SOPN: PEN_INJECTOR | SUBCUTANEOUS | 2 refills | Status: DC

## 2023-07-22 MED ORDER — OMEPRAZOLE 20 MG PO CPDR: 20.0000 mg | DELAYED_RELEASE_CAPSULE | Freq: Every day | ORAL | 1 refills | Status: DC

## 2023-07-22 NOTE — Progress Notes (Signed)
 Established Patient Office Visit. TOC from Dr. Lovetta Rucks.    Subjective  Patient ID: Nicole Washington, female    DOB: [birth year]  Age: 73 y.o. MRN: [id number]  Chief Complaint  Patient presents with   Diabetes    She  has a past medical history of Actinic keratosis, Basal cell carcinoma, Bronchitis (06/23/2021), Burning mouth syndrome, CAD (coronary artery disease), Cancer (HCC), Depression, GERD (gastroesophageal reflux disease), Hyperlipidemia, Hypertension, Lump in neck (07/28/2019), Lymphadenopathy, cervical (11/25/2022), Pre-diabetes, Sleep apnea, Squamous cell carcinoma of skin, and Thyroid  disease.   - Diabetes II:  Home BG: Does not check BG at home Current meds: On Ozempic  0.5mg /week Diet: Mostly eats home cooked meals. Eats ice cream almost daily.   Exercise: Not really exercising  Last labs LDL 03/22/23, 73 A1c 03/03/23: 6.0% Urine microalbumin:cr 09/08/22, normal Renal function: 03/03/23 Cr normal and GFR 60.96 Diabetic eye exam: With  regional  - Mixed hyperlipidemia:  On Atorvastatin  80 mg. Tolerating well  - Hypertension:  On Amlodipine  5 mg daily, Carvedilol  3.125 mg BID, Losartan  50 mg once a day. No home BP check. No chest pain, headache, lower leg edema.   - Anxiety, PDSD:  Sees Dr. Kapoor. No SI/HI On Duloxetine 60 mg, Amitriptyline 10 mg, Sertraline 100 mg once a day.   - Burning mouth syndrome:  Intermittent controlled with takes Clonazepam  0.5 mg (in one month she takes about 4 tab).   - Insomnia:  Takes Trazadone 100 mg prn. Feels groggy in the morning when she takes it so tends to limit it's use.   - Neuropathic pain affecting bilateral posterior scalp, neck and bilateral upper extremities: Occasional tremors: Sees Dr. Mason Sole for neurology:  On Lyrica 105 mg, twice a day.  - Cold sores: Takes Valtrex  prn. Last episode about 8 months ago.  - GERD: Takes Omeprazole  20 mg everyday.  04/06/22 vitamin B 12 was on low normal side.  - Recently had  biopsy of couple of spots on left forearm. Patient reports one biopsy spot is not healing as well. Has reached out to dermatology and has been applying antibiotic ointment as recommended by derm.   - Lower back pain, left shoulder pain: F/U with ortho   ROS As per HPI    Objective:      BP 110/68   Pulse 97   Ht 5\' 2"  (1.575 m)   Wt 172 lb (78 kg)   SpO2 97%   BMI 31.46 kg/m      07/22/2023    2:09 PM 03/18/2023    2:07 PM 03/03/2023   11:18 AM  Depression screen PHQ 2/9  Decreased Interest 0 1 0  Down, Depressed, Hopeless 0 0 0  PHQ - 2 Score 0 1 0  Altered sleeping 0 0 1  Tired, decreased energy 0 0 0  Change in appetite 0 0 1  Feeling bad or failure about yourself  0 0 0  Trouble concentrating 0 0 0  Moving slowly or fidgety/restless 0 0 0  Suicidal thoughts 0 0 0  PHQ-9 Score 0 1 2  Difficult doing work/chores Not difficult at all Not difficult at all Not difficult at all      07/22/2023    2:10 PM 03/18/2023    2:08 PM 03/03/2023   11:18 AM 11/25/2022    2:21 PM  GAD 7 : Generalized Anxiety Score  Nervous, Anxious, on Edge 0 1 1 2   Control/stop worrying 0 1 0 1  Worry too much - different things  0 1 1 1   Trouble relaxing 0 0 0 0  Restless 0 0 0 0  Easily annoyed or irritable 0 0 0 1  Afraid - awful might happen 0 0 0 0  Total GAD 7 Score 0 3 2 5   Anxiety Difficulty Not difficult at all Not difficult at all Not difficult at all Not difficult at all      07/22/2023    2:09 PM 03/18/2023    2:07 PM 03/03/2023   11:18 AM  Depression screen PHQ 2/9  Decreased Interest 0 1 0  Down, Depressed, Hopeless 0 0 0  PHQ - 2 Score 0 1 0  Altered sleeping 0 0 1  Tired, decreased energy 0 0 0  Change in appetite 0 0 1  Feeling bad or failure about yourself  0 0 0  Trouble concentrating 0 0 0  Moving slowly or fidgety/restless 0 0 0  Suicidal thoughts 0 0 0  PHQ-9 Score 0 1 2  Difficult doing work/chores Not difficult at all Not difficult at all Not difficult at all       07/22/2023    2:10 PM 03/18/2023    2:08 PM 03/03/2023   11:18 AM 11/25/2022    2:21 PM  GAD 7 : Generalized Anxiety Score  Nervous, Anxious, on Edge 0 1 1 2   Control/stop worrying 0 1 0 1  Worry too much - different things 0 1 1 1   Trouble relaxing 0 0 0 0  Restless 0 0 0 0  Easily annoyed or irritable 0 0 0 1  Afraid - awful might happen 0 0 0 0  Total GAD 7 Score 0 3 2 5   Anxiety Difficulty Not difficult at all Not difficult at all Not difficult at all Not difficult at all   SDOH Screenings   Food Insecurity: Patient Declined (02/22/2023)  Housing: Patient Declined (02/22/2023)  Transportation Needs: Patient Declined (02/22/2023)  Alcohol Screen: Low Risk  (02/22/2023)  Depression (PHQ2-9): Low Risk  (07/22/2023)  Financial Resource Strain: Low Risk  (02/22/2023)  Physical Activity: Unknown (02/22/2023)  Social Connections: Socially Integrated (02/22/2023)  Stress: Stress Concern Present (02/22/2023)  Tobacco Use: Medium Risk (07/22/2023)     Physical Exam Constitutional:      General: She is not in acute distress.    Appearance: Normal appearance.  HENT:     Head: Normocephalic and atraumatic.     Right Ear: Tympanic membrane normal.     Left Ear: Tympanic membrane normal.     Mouth/Throat:     Mouth: Mucous membranes are moist.  Neck:     Thyroid : No thyroid  mass or thyroid  tenderness.  Cardiovascular:     Rate and Rhythm: Normal rate and regular rhythm.     Heart sounds: No murmur heard. Pulmonary:     Effort: Pulmonary effort is normal.     Breath sounds: Normal breath sounds.  Abdominal:     General: Bowel sounds are normal.     Palpations: Abdomen is soft.     Tenderness: There is no guarding.  Musculoskeletal:     Cervical back: Neck supple. No rigidity.     Right lower leg: No edema.     Left lower leg: No edema.  Skin:    General: Skin is warm.     Comments: Left dorsal forearm:  left forearm reveals a healing spot from recent biopsy, without discharge or  erythema suggestive of active infection.  Neurological:     Mental Status: She  is alert and oriented to person, place, and time.  Psychiatric:        Mood and Affect: Mood normal.        Behavior: Behavior normal.      Diabetic foot exam was performed with the following findings:   No deformities, ulcerations, or other skin breakdown Normal sensation of 10g monofilament Intact posterior tibialis and dorsalis pedis pulses     No results found for any visits on 07/22/23.  The 10-year ASCVD risk score (Arnett DK, et al., 2019) is: 17.5%     Assessment & Plan:   Type 2 diabetes mellitus without complication, without long-term current use of insulin (HCC) Assessment & Plan: Chronic issue.   Last A1c within goal.  Check A1c.  Continue Ozempic  0.5 mg weekly.  Diabetic foot exam done today, normal.  Orders: -     Hemoglobin A1c -     Ozempic  (0.25 or 0.5 MG/DOSE); DIAL AND INJECT UNDER THE SKIN 0.5 MG WEEKLY  Dispense: 3 mL; Refill: 2  Medication management Assessment & Plan: Normal diabetic foot exam.   Orders: -     Vitamin B12  Encounter for diabetic foot exam (HCC) Assessment & Plan: Normal diabetic foot exam.    Tremors of nervous system  Anxiety and depression Assessment & Plan: Chronic issue.  No SI/HI. Stable on current treatment regimen by psychiatry. Continue follow up and management per psychiatry.     Burning mouth syndrome Assessment & Plan: Stable on prn Clonazepam  0.5 mg (4 tabs per month on average). PDMP reviewed last refill with 60 tabs sent on 06/01/23. Refill not due at this time if patient is taking medication as stated during office visit. I discussed the side effects of chronic benzodiazepine use with the patient, highlighting risks such as cognitive impairment, dependency, and increased likelihood of falls. She reports she is aware of potential s/e and symptoms has been stable on prn Clonazepam  0.5 mg.    Chronic prescription benzodiazepine  use Assessment & Plan: Plan per burning mouth syndrome.  Consider updating controlled substance agreement and drug screen during f/u visit.    Gastroesophageal reflux disease, unspecified whether esophagitis present Assessment & Plan: [place]  Symptoms stable on Omeprazole  20 mg. Check vitamin B 12 level today.  Consider repeating EGD in the future.   Orders: -     Omeprazole ; Take 1 capsule (20 mg total) by mouth daily.  Dispense: 90 capsule; Refill: 1  Mixed hyperlipidemia Assessment & Plan: Goal LDL <70, reviewed LDL from 03/22/23. Continue Atorvastatin  80 mg daily. Recommend implementing lifestyle modification with moderate intensity exercise, diet modification. Repeat lipid panel around 03/21/24  Orders: -     Atorvastatin  Calcium ; Take 1 tablet (80 mg total) by mouth daily.  Dispense: 90 tablet; Refill: 1  Primary hypertension Assessment & Plan: BP stable on current treatment regime with Amlodipine  5 mg daily, Carvedilol  3.125 mg BID, Losartan  50 mg once a day. We can consider reducing Amlodipine  dose if patient continues to have BP on low normal side to reduce risk of hypotension. Reevaluate in 3 months.    Hypertension, unspecified type Assessment & Plan: BP stable on current treatment regime with Amlodipine  5 mg daily, Carvedilol  3.125 mg BID, Losartan  50 mg once a day. We can consider reducing Amlodipine  dose if patient continues to have BP on low normal side to reduce risk of hypotension. Reevaluate in 3 months.   Orders: -     amLODIPine   Besylate; Take 1 tablet (5 mg total) by mouth daily.  Dispense: 90 tablet; Refill: 1 -     Carvedilol ; Take 1 tablet (3.125 mg total) by mouth 2 (two) times daily with a meal.  Dispense: 180 tablet; Refill: 1 -     Losartan  Potassium; Take 1 tablet (50 mg total) by mouth daily.  Dispense: 90 tablet; Refill: 3  Due for tetanus booster. Patient will check with her pharmacy to update immunization.  Counseled  patient to reach out to us  or dermatology if she develops signs of infection on biopsy site on left forearm (which can includes but limited to the following: fever, redness, swelling, and increased pain at the site).   Return in about 3 months (around 10/22/2023) for Diabetes, htn, chronic f/u.   Jacklin Mascot, MD

## 2023-07-22 NOTE — Assessment & Plan Note (Signed)
Normal diabetic foot exam. 

## 2023-07-22 NOTE — Assessment & Plan Note (Addendum)
 Plan per burning mouth syndrome.  Consider updating controlled substance agreement and drug screen during f/u visit.

## 2023-07-22 NOTE — Assessment & Plan Note (Signed)
 Chronic issue.   Last A1c within goal.  Check A1c.  Continue Ozempic  0.5 mg weekly.  Diabetic foot exam done today, normal.

## 2023-07-22 NOTE — Assessment & Plan Note (Signed)
 Patient had a h/[place] EGD [place] out of state.  Symptoms stable on Omeprazole  20 mg. Check vitamin B 12 level today.  Consider repeating EGD in the future.

## 2023-07-22 NOTE — Assessment & Plan Note (Signed)
 BP stable on current treatment regime with Amlodipine  5 mg daily, Carvedilol  3.125 mg BID, Losartan  50 mg once a day. We can consider reducing Amlodipine  dose if patient continues to have BP on low normal side to reduce risk of hypotension. Reevaluate in 3 months.

## 2023-07-22 NOTE — Assessment & Plan Note (Signed)
 Chronic issue.  No SI/HI. Stable on current treatment regimen by psychiatry. Continue follow up and management per psychiatry.

## 2023-07-22 NOTE — Assessment & Plan Note (Addendum)
 Stable on prn Clonazepam  0.5 mg (4 tabs per month on average). PDMP reviewed last refill with 60 tabs sent on 06/01/23. Refill not due at this time if patient is taking medication as stated during office visit. I discussed the side effects of chronic benzodiazepine use with the patient, highlighting risks such as cognitive impairment, dependency, and increased likelihood of falls. She reports she is aware of potential s/e and symptoms has been stable on prn Clonazepam  0.5 mg.

## 2023-07-22 NOTE — Assessment & Plan Note (Signed)
 F/u with ortho, has appointment next week.

## 2023-07-22 NOTE — Assessment & Plan Note (Signed)
 Goal LDL <70, reviewed LDL from 03/22/23. Continue Atorvastatin  80 mg daily. Recommend implementing lifestyle modification with moderate intensity exercise, diet modification. Repeat lipid panel around 03/21/24

## 2023-07-23 LAB — HEMOGLOBIN A1C: Hgb A1c MFr Bld: 6.3 % (ref 4.6–6.5)

## 2023-07-27 ENCOUNTER — Encounter: Payer: Self-pay | Admitting: Obstetrics and Gynecology

## 2023-07-27 DIAGNOSIS — M25512 Pain in left shoulder: Secondary | ICD-10-CM | POA: Diagnosis not present

## 2023-07-27 DIAGNOSIS — M542 Cervicalgia: Secondary | ICD-10-CM | POA: Diagnosis not present

## 2023-07-28 ENCOUNTER — Other Ambulatory Visit: Payer: Self-pay | Admitting: Obstetrics and Gynecology

## 2023-07-28 DIAGNOSIS — R928 Other abnormal and inconclusive findings on diagnostic imaging of breast: Secondary | ICD-10-CM

## 2023-07-28 DIAGNOSIS — M25512 Pain in left shoulder: Secondary | ICD-10-CM | POA: Diagnosis not present

## 2023-07-29 ENCOUNTER — Ambulatory Visit: Payer: Self-pay

## 2023-07-29 DIAGNOSIS — E538 Deficiency of other specified B group vitamins: Secondary | ICD-10-CM | POA: Insufficient documentation

## 2023-07-29 LAB — VITAMIN B12: Vitamin B-12: 173 pg/mL — ABNORMAL LOW (ref 211–911)

## 2023-07-29 NOTE — Progress Notes (Signed)
 Please notify patient: Vitamin B12 is low. Untreated low B 12 can cause dementia, neuropathy. Recommend schedule RN appointment to start vitamin B12 shots: 1000 mcg shots IM weekly x 4 doses then monthly after that.  Recheck Vitamin B12, folic acid  level in roughly 4 weeks. Lab ordered. While waiting for injection appointment, recommend starting over the counter B 12 supplement, take 2000 mcg daily.   HbA1c is slightly elevated compared to 4 months ago. Recommend cutting down on carbohydrate, sugar rich food.   Regards, Jacklin Mascot, MD

## 2023-07-30 ENCOUNTER — Ambulatory Visit (INDEPENDENT_AMBULATORY_CARE_PROVIDER_SITE_OTHER)

## 2023-07-30 ENCOUNTER — Other Ambulatory Visit (INDEPENDENT_AMBULATORY_CARE_PROVIDER_SITE_OTHER)

## 2023-07-30 DIAGNOSIS — E538 Deficiency of other specified B group vitamins: Secondary | ICD-10-CM

## 2023-07-30 MED ORDER — CYANOCOBALAMIN 1000 MCG/ML IJ SOLN
1000.0000 ug | Freq: Once | INTRAMUSCULAR | Status: AC
  Administered 2023-07-30: 1000 ug via INTRAMUSCULAR

## 2023-07-30 NOTE — Progress Notes (Signed)
 After obtaining consent, and per orders of Dr.Bair, injection of B-12 given IM in R Deltoid by Virgina Grills, CMA. Patient tolerated injection well.

## 2023-07-30 NOTE — Addendum Note (Signed)
 Addended by: Lindle Rhea on: 07/30/2023 03:05 PM   Modules accepted: Orders

## 2023-08-03 ENCOUNTER — Ambulatory Visit
Admission: RE | Admit: 2023-08-03 | Discharge: 2023-08-03 | Disposition: A | Discharge status: Home / Self Care | Source: Ambulatory Visit | Attending: Obstetrics and Gynecology | Admitting: Obstetrics and Gynecology

## 2023-08-03 DIAGNOSIS — R928 Other abnormal and inconclusive findings on diagnostic imaging of breast: Secondary | ICD-10-CM

## 2023-08-03 DIAGNOSIS — R92321 Mammographic fibroglandular density, right breast: Secondary | ICD-10-CM | POA: Diagnosis not present

## 2023-08-06 ENCOUNTER — Ambulatory Visit

## 2023-08-06 DIAGNOSIS — E538 Deficiency of other specified B group vitamins: Secondary | ICD-10-CM

## 2023-08-06 MED ORDER — CYANOCOBALAMIN 1000 MCG/ML IJ SOLN
1000.0000 ug | Freq: Once | INTRAMUSCULAR | Status: AC
  Administered 2023-08-06: 1000 ug via INTRAMUSCULAR

## 2023-08-06 NOTE — Progress Notes (Signed)
 After obtaining consent, and per orders of Dr..Bair, MD injection of B-12 given by Virgina Grills, CMA in L Deltoid. Patient tolerated injection well.

## 2023-08-09 NOTE — Telephone Encounter (Signed)
 Noted

## 2023-08-13 ENCOUNTER — Ambulatory Visit (INDEPENDENT_AMBULATORY_CARE_PROVIDER_SITE_OTHER)

## 2023-08-13 DIAGNOSIS — E538 Deficiency of other specified B group vitamins: Secondary | ICD-10-CM

## 2023-08-13 MED ORDER — CYANOCOBALAMIN 1000 MCG/ML IJ SOLN
1000.0000 ug | Freq: Once | INTRAMUSCULAR | Status: AC
Start: 2023-08-13 — End: 2023-08-13
  Administered 2023-08-13: 1000 ug via INTRAMUSCULAR

## 2023-08-13 NOTE — Progress Notes (Signed)
 Patient presented for B 12 injection to right deltoid, patient voiced no concerns nor showed any signs of distress during injection.

## 2023-08-23 ENCOUNTER — Ambulatory Visit (INDEPENDENT_AMBULATORY_CARE_PROVIDER_SITE_OTHER)

## 2023-08-23 DIAGNOSIS — E538 Deficiency of other specified B group vitamins: Secondary | ICD-10-CM | POA: Diagnosis not present

## 2023-08-23 MED ORDER — CYANOCOBALAMIN 1000 MCG/ML IJ SOLN
1000.0000 ug | Freq: Once | INTRAMUSCULAR | Status: AC
  Administered 2023-08-23: 1000 ug via INTRAMUSCULAR

## 2023-08-23 NOTE — Progress Notes (Signed)
 Patient is in office today for a nurse visit for B12 Injection. Patient Injection was given in the  Left deltoid. Patient tolerated injection well.

## 2023-09-20 ENCOUNTER — Ambulatory Visit (INDEPENDENT_AMBULATORY_CARE_PROVIDER_SITE_OTHER)

## 2023-09-20 DIAGNOSIS — E538 Deficiency of other specified B group vitamins: Secondary | ICD-10-CM

## 2023-09-20 MED ORDER — CYANOCOBALAMIN 1000 MCG/ML IJ SOLN
1000.0000 ug | Freq: Once | INTRAMUSCULAR | Status: AC
  Administered 2023-09-20: 1000 ug via INTRAMUSCULAR

## 2023-09-20 NOTE — Progress Notes (Signed)
 Pt presented for their vitamin B12 injection. Pt was identified through two identifiers. Pt tolerated shot well in their right deltoid.

## 2023-09-22 DIAGNOSIS — F4312 Post-traumatic stress disorder, chronic: Secondary | ICD-10-CM | POA: Diagnosis not present

## 2023-09-22 DIAGNOSIS — F39 Unspecified mood [affective] disorder: Secondary | ICD-10-CM | POA: Diagnosis not present

## 2023-09-22 DIAGNOSIS — F411 Generalized anxiety disorder: Secondary | ICD-10-CM | POA: Diagnosis not present

## 2023-09-22 DIAGNOSIS — F5105 Insomnia due to other mental disorder: Secondary | ICD-10-CM | POA: Diagnosis not present

## 2023-10-12 ENCOUNTER — Ambulatory Visit: Payer: Medicare Other | Admitting: Dermatology

## 2023-10-14 ENCOUNTER — Other Ambulatory Visit: Payer: Self-pay

## 2023-10-14 DIAGNOSIS — K146 Glossodynia: Secondary | ICD-10-CM

## 2023-10-14 MED ORDER — CLONAZEPAM 0.5 MG PO TABS: ORAL_TABLET | ORAL | 0 refills | Status: DC

## 2023-10-14 NOTE — Telephone Encounter (Signed)
 1. Burning mouth syndrome - clonazePAM  (KLONOPIN ) 0.5 MG tablet; TAKE 2 TABLETS BY MOUTH DAILY AS NEEDED FOR ANXIETY  Dispense: 60 tablet; Refill: 0 - PDMP reviewed, last refill for Clonazepam  0.5 mg was on 06/01/23 for 60 tablets.  - Will continue to discuss tapering off Clonazepam  and s/e during office visit in future.  Luke Shade, MD

## 2023-10-21 ENCOUNTER — Ambulatory Visit (INDEPENDENT_AMBULATORY_CARE_PROVIDER_SITE_OTHER)

## 2023-10-21 ENCOUNTER — Other Ambulatory Visit: Payer: Self-pay

## 2023-10-21 DIAGNOSIS — E538 Deficiency of other specified B group vitamins: Secondary | ICD-10-CM

## 2023-10-21 DIAGNOSIS — E119 Type 2 diabetes mellitus without complications: Secondary | ICD-10-CM

## 2023-10-21 MED ORDER — CYANOCOBALAMIN 1000 MCG/ML IJ SOLN
1000.0000 ug | Freq: Once | INTRAMUSCULAR | Status: AC
Start: 2023-10-21 — End: 2023-10-21
  Administered 2023-10-21: 1000 ug via INTRAMUSCULAR

## 2023-10-21 NOTE — Progress Notes (Signed)
Pt received B12 injection in Left deltoid. Pt tolerated it well with no complaints or concerns.

## 2023-10-26 ENCOUNTER — Ambulatory Visit

## 2023-10-26 VITALS — BP 110/65 | HR 101 | Temp 97.8°F | Ht 62.0 in | Wt 174.0 lb

## 2023-10-26 DIAGNOSIS — E119 Type 2 diabetes mellitus without complications: Secondary | ICD-10-CM

## 2023-10-26 DIAGNOSIS — Z23 Encounter for immunization: Secondary | ICD-10-CM

## 2023-10-26 DIAGNOSIS — I10 Essential (primary) hypertension: Secondary | ICD-10-CM

## 2023-10-26 DIAGNOSIS — E66811 Obesity, class 1: Secondary | ICD-10-CM | POA: Diagnosis not present

## 2023-10-26 DIAGNOSIS — Z7985 Long-term (current) use of injectable non-insulin antidiabetic drugs: Secondary | ICD-10-CM

## 2023-10-26 DIAGNOSIS — K146 Glossodynia: Secondary | ICD-10-CM

## 2023-10-26 DIAGNOSIS — E1169 Type 2 diabetes mellitus with other specified complication: Secondary | ICD-10-CM

## 2023-10-26 DIAGNOSIS — E538 Deficiency of other specified B group vitamins: Secondary | ICD-10-CM

## 2023-10-26 DIAGNOSIS — Z6831 Body mass index (BMI) 31.0-31.9, adult: Secondary | ICD-10-CM | POA: Diagnosis not present

## 2023-10-26 DIAGNOSIS — E785 Hyperlipidemia, unspecified: Secondary | ICD-10-CM

## 2023-10-26 MED ORDER — SEMAGLUTIDE (1 MG/DOSE) 4 MG/3ML ~~LOC~~ SOPN: 1.0000 mg | PEN_INJECTOR | SUBCUTANEOUS | 1 refills | Status: AC

## 2023-10-26 NOTE — Assessment & Plan Note (Signed)
 Patient up to date, normal diabetic foot exam today.

## 2023-10-26 NOTE — Assessment & Plan Note (Signed)
 BP slightly low on arrival, repeat BP within goal.  Check BP at home 2-3 times a week, keep home BP log.  Recommend f/u in about 4 months with home BP reading.  Discussed risks of too low and too high BP with the patient.  For now continue Amlodipine  5 mg daily, Carvedilol  3.125 mg BID, Losartan  50 mg once a day.   Check CMP today.

## 2023-10-26 NOTE — Assessment & Plan Note (Signed)
 Check vitamin D  level. Discuss dietary habits and encourage regular moderate intensity exercise.

## 2023-10-26 NOTE — Assessment & Plan Note (Signed)
 Chronic issue.  Repeat A1c today. Patient counseled on diabetic kidney exam today, she declined.  Has been tolerating Ozempic  0.5 mg once weekly (injection on Fridays).   Increase Ozempic  to 1 mg once weekly, new refill sent.  Monitor for side effects such as bloating, nausea, and diarrhea. F/u with Dr. Myrna annually for diabetic eye exam.  Discuss dietary habits, encourage increasing activities.

## 2023-10-26 NOTE — Patient Instructions (Addendum)
-   I have referred you to rheumatologist. You will get a phone call to schedule an appointment within the next couple of weeks. Reach out to our office if you do not hear to schedule an appointment in 2 weeks.   - I am increasing dose of Ozempic  from 0.5 mg to 1 mg once weekly injection. Let me know if you develop nausea, vomiting, diarrhea. We may need to reduce to dose if side effects occurs.   - Continue monthly B12 injection.   - Please check BP at home 2-3 times a week and bring BP reading during your follow up visit in 4 months.   - Please update COVID-19 and RSV vaccine through your local pharmacy.

## 2023-10-26 NOTE — Assessment & Plan Note (Signed)
 Goal LDL <70. Continue Atorvastatin  80 mg daily.  Repeat lipid panel around 03/21/24

## 2023-10-26 NOTE — Assessment & Plan Note (Signed)
 Anticipate improvement of B 12 level with IM once monthly B12 1000 mcg, continue B12 monthly injection. Check B12 and folate levels today.

## 2023-10-26 NOTE — Progress Notes (Signed)
 Established Patient Office Visit   Subjective  Patient ID: Nicole Washington, female    DOB: Dec 26, 1950  Age: 73 y.o. MRN: [id number]  Chief Complaint  Patient presents with   Diabetes   Hypertension    She  has a past medical history of Actinic keratosis, Adenomatous polyp of colon (08/27/2022), Basal cell carcinoma, Bronchitis (06/23/2021), Burning mouth syndrome, CAD (coronary artery disease), Cancer (HCC), Depression, GERD (gastroesophageal reflux disease), Hyperlipidemia, Hypertension, Lump in neck (07/28/2019), Lymphadenopathy, cervical (11/25/2022), Pre-diabetes, Sleep apnea, Squamous cell carcinoma of skin, and Thyroid  disease.  HPI Discussed the use of AI scribe software for clinical note transcription with the patient, who gave verbal consent to proceed.  History of Present Illness Nicole Washington is a 73 year old female with hypertension who presents for medication management and evaluation of low blood pressure.  She has been experiencing low blood pressure and reports feeling a little sleepy. She is currently on three antihypertensive medications: amlodipine  5 mg, carvedilol , and chlorothiazide. She has had issues with blood pressure management in the past when medication was adjusted. She does not regularly check her blood pressure at home.  Her medication regimen includes amlodipine  5 mg for blood pressure, amitriptyline 10 mg daily, atorvastatin  for cholesterol, and clonazepam  as needed, which she uses sparingly. She also takes Lyrica for tingling in her head, which has improved her symptoms but not completely resolved them.  She experiences burning mouth syndrome and dry mouth, which she associates with Sjogren's syndrome, a condition her daughter is suspected to have. She is interested in genetic testing due to a family history of autoimmune diseases, including lupus in her daughter and niece.  Her family history is significant for autoimmune diseases, with her daughter and niece  diagnosed with lupus, and her niece also having Hashimoto's and a thyroid  tumor. Her niece and her niece's sister have been diagnosed with neuroblastoma, which is noted to be a genetic issue on their biological father's side.  She has been taking B12 injections due to a previously low B12 level and is now on a maintenance dose of once monthly. She is also on Ozempic  0.5 mg weekly for weight management but reports no recent weight loss and tolerates the medication well. She has experienced dietary challenges due to her husband's eating habits, which affect her ability to maintain a healthy diet.   ROS As per HPI    Objective:     BP 110/65 (BP Location: Right Arm, Cuff Size: Normal)   Pulse (!) 101   Temp 97.8 F (36.6 C) (Oral)   Ht 5' 2 (1.575 m)   Wt 174 lb (78.9 kg)   SpO2 93%   BMI 31.83 kg/m      10/26/2023    2:08 PM 07/22/2023    2:09 PM 03/18/2023    2:07 PM  Depression screen PHQ 2/9  Decreased Interest 0 0 1  Down, Depressed, Hopeless 0 0 0  PHQ - 2 Score 0 0 1  Altered sleeping 0 0 0  Tired, decreased energy 0 0 0  Change in appetite 0 0 0  Feeling bad or failure about yourself  0 0 0  Trouble concentrating 0 0 0  Moving slowly or fidgety/restless 0 0 0  Suicidal thoughts 0 0 0  PHQ-9 Score 0 0 1  Difficult doing work/chores Not difficult at all Not difficult at all Not difficult at all      10/26/2023    2:08 PM 07/22/2023    2:10 PM  03/18/2023    2:08 PM 03/03/2023   11:18 AM  GAD 7 : Generalized Anxiety Score  Nervous, Anxious, on Edge 0 0 1 1  Control/stop worrying 0 0 1 0  Worry too much - different things 0 0 1 1  Trouble relaxing  0 0 0  Restless 0 0 0 0  Easily annoyed or irritable 0 0 0 0  Afraid - awful might happen 0 0 0 0  Total GAD 7 Score  0 3 2  Anxiety Difficulty Not difficult at all Not difficult at all Not difficult at all Not difficult at all      10/26/2023    2:08 PM 07/22/2023    2:09 PM 03/18/2023    2:07 PM  Depression screen PHQ 2/9   Decreased Interest 0 0 1  Down, Depressed, Hopeless 0 0 0  PHQ - 2 Score 0 0 1  Altered sleeping 0 0 0  Tired, decreased energy 0 0 0  Change in appetite 0 0 0  Feeling bad or failure about yourself  0 0 0  Trouble concentrating 0 0 0  Moving slowly or fidgety/restless 0 0 0  Suicidal thoughts 0 0 0  PHQ-9 Score 0 0 1  Difficult doing work/chores Not difficult at all Not difficult at all Not difficult at all      10/26/2023    2:08 PM 07/22/2023    2:10 PM 03/18/2023    2:08 PM 03/03/2023   11:18 AM  GAD 7 : Generalized Anxiety Score  Nervous, Anxious, on Edge 0 0 1 1  Control/stop worrying 0 0 1 0  Worry too much - different things 0 0 1 1  Trouble relaxing  0 0 0  Restless 0 0 0 0  Easily annoyed or irritable 0 0 0 0  Afraid - awful might happen 0 0 0 0  Total GAD 7 Score  0 3 2  Anxiety Difficulty Not difficult at all Not difficult at all Not difficult at all Not difficult at all   SDOH Screenings   Food Insecurity: No Food Insecurity (10/22/2023)  Housing: Low Risk  (10/22/2023)  Transportation Needs: No Transportation Needs (10/22/2023)  Alcohol Screen: Low Risk  (10/22/2023)  Depression (PHQ2-9): Low Risk  (10/26/2023)  Financial Resource Strain: Low Risk  (10/22/2023)  Physical Activity: Inactive (10/22/2023)  Social Connections: Socially Integrated (10/22/2023)  Stress: No Stress Concern Present (10/22/2023)  Tobacco Use: Medium Risk (10/26/2023)     Physical Exam Constitutional:      General: She is not in acute distress. HENT:     Head: Normocephalic.     Right Ear: Tympanic membrane normal.     Left Ear: Tympanic membrane normal.     Mouth/Throat:     Mouth: Mucous membranes are moist.  Cardiovascular:     Rate and Rhythm: Normal rate.     Pulses:          Dorsalis pedis pulses are 2+ on the right side and 2+ on the left side.       Posterior tibial pulses are 2+ on the right side and 2+ on the left side.  Pulmonary:     Effort: Pulmonary effort is normal.     Breath  sounds: Normal breath sounds.  Abdominal:     General: Bowel sounds are normal.     Palpations: Abdomen is soft.     Tenderness: There is no guarding.  Musculoskeletal:     Cervical back: No tenderness.  Right lower leg: No edema.     Left lower leg: No edema.     Right foot: No Charcot foot.     Left foot: No Charcot foot.  Feet:     Right foot:     Skin integrity: Skin integrity normal. No dry skin.     Toenail Condition: Right toenails are normal.     Left foot:     Skin integrity: Skin integrity normal. No dry skin.     Toenail Condition: Left toenails are normal.  Lymphadenopathy:     Cervical: No cervical adenopathy.  Skin:    General: Skin is warm.  Neurological:     Mental Status: She is alert and oriented to person, place, and time.  Psychiatric:        Mood and Affect: Mood normal.      Diabetic foot exam was performed with the following findings:   No deformities, ulcerations, or other skin breakdown Normal sensation of 10g monofilament Intact posterior tibialis and dorsalis pedis pulses     No results found for any visits on 10/26/23.  The 10-year ASCVD risk score (Arnett DK, et al., 2019) is: 25.3%     Assessment & Plan:   Burning mouth syndrome Assessment & Plan: Stable on prn Clonazepam  0.5 mg (4 tabs per month on average). Also established with neurology and has been on Lyrica.  PDMP reviewed last refill with 60 tabs sent on 10/14/2023. Does not need refill at this time. Again discussed s/e of Benzodiazepines, patient verbalizes risks and benefits.  She has family h/o autoimmune disease including lupus in her daughter. She has had normal autoimmune markers in 2020, she is interested in seeing rheumatology for further evaluation into suspected Sjogren's syndrome. Referral made today to Dr. Mai (patient requested referral to him).  Orders: -     Ambulatory referral to Rheumatology  Hypertension, unspecified type Assessment & Plan: BP  slightly low on arrival, repeat BP within goal.  Check BP at home 2-3 times a week, keep home BP log.  Recommend f/u in about 4 months with home BP reading.  Discussed risks of too low and too high BP with the patient.  For now continue Amlodipine  5 mg daily, Carvedilol  3.125 mg BID, Losartan  50 mg once a day.   Check CMP today.  Orders: -     Comprehensive metabolic panel with GFR  Hyperlipidemia associated with type 2 diabetes mellitus (HCC) Assessment & Plan: Goal LDL <70. Continue Atorvastatin  80 mg daily.  Repeat lipid panel around 03/21/24   Obesity (BMI 30.0-34.9) Assessment & Plan: Check vitamin D  level. Discuss dietary habits and encourage regular moderate intensity exercise.   Orders: -     VITAMIN D  25 Hydroxy (Vit-D Deficiency, Fractures)  B12 deficiency Assessment & Plan: Anticipate improvement of B 12 level with IM once monthly B12 1000 mcg, continue B12 monthly injection. Check B12 and folate levels today.   Orders: -     B12 and Folate Panel  Type 2 diabetes mellitus without complication, without long-term current use of insulin (HCC) Assessment & Plan: Chronic issue.  Repeat A1c today. Patient counseled on diabetic kidney exam today, she declined.  Has been tolerating Ozempic  0.5 mg once weekly (injection on Fridays).   Increase Ozempic  to 1 mg once weekly, new refill sent.  Monitor for side effects such as bloating, nausea, and diarrhea. F/u with Dr. Myrna annually for diabetic eye exam.  Discuss dietary habits, encourage increasing activities.   Orders: -  Semaglutide  (1 MG/DOSE); Inject 1 mg into the skin once a week.  Dispense: 9 mL; Refill: 1 -     Hemoglobin A1c  Body mass index (BMI) 31.0-31.9, adult -     VITAMIN D  25 Hydroxy (Vit-D Deficiency, Fractures)  Needs flu shot -     Flu vaccine HIGH DOSE PF(Fluzone Trivalent)  Encounter for diabetic foot exam [facility]) Assessment & Plan: Patient up to date, normal diabetic foot exam today.      Return in about 4 months (around 02/25/2024) for Chronic follow up, medicare wellness nurse visit whenever convinient.   Luke Shade, MD

## 2023-10-26 NOTE — Assessment & Plan Note (Addendum)
 Stable on prn Clonazepam  0.5 mg (4 tabs per month on average). Also established with neurology and has been on Lyrica.  PDMP reviewed last refill with 60 tabs sent on 10/14/2023. Does not need refill at this time. Again discussed s/e of Benzodiazepines, patient verbalizes risks and benefits.  She has family h/o autoimmune disease including lupus in her daughter. She has had normal autoimmune markers in 2020, she is interested in seeing rheumatology for further evaluation into suspected Sjogren's syndrome. Referral made today to Dr. Mai (patient requested referral to him).

## 2023-10-27 LAB — VITAMIN D 25 HYDROXY (VIT D DEFICIENCY, FRACTURES): VITD: 23.19 ng/mL — ABNORMAL LOW (ref 30.00–100.00)

## 2023-10-27 LAB — COMPREHENSIVE METABOLIC PANEL WITH GFR
ALT: 13 U/L (ref 0–35)
AST: 14 U/L (ref 0–37)
Albumin: 4.3 g/dL (ref 3.5–5.2)
Alkaline Phosphatase: 117 U/L (ref 39–117)
BUN: 21 mg/dL (ref 6–23)
CO2: 31 meq/L (ref 19–32)
Calcium: 9.3 mg/dL (ref 8.4–10.5)
Chloride: 99 meq/L (ref 96–112)
Creatinine, Ser: 0.97 mg/dL (ref 0.40–1.20)
GFR: 57.69 mL/min — ABNORMAL LOW (ref >60.00)
Glucose, Bld: 96 mg/dL (ref 70–99)
Potassium: 4 meq/L (ref 3.5–5.1)
Sodium: 138 meq/L (ref 135–145)
Total Bilirubin: 0.5 mg/dL (ref 0.2–1.2)
Total Protein: 6.9 g/dL (ref 6.0–8.3)

## 2023-10-27 LAB — B12 AND FOLATE PANEL
Folate: 9.3 ng/mL (ref >5.9)
Vitamin B-12: 643 pg/mL (ref 211–911)

## 2023-10-27 LAB — HEMOGLOBIN A1C: Hgb A1c MFr Bld: 6.4 % (ref 4.6–6.5)

## 2023-10-28 ENCOUNTER — Ambulatory Visit: Payer: Self-pay

## 2023-10-28 DIAGNOSIS — E559 Vitamin D deficiency, unspecified: Secondary | ICD-10-CM

## 2023-10-28 MED ORDER — VITAMIN D (ERGOCALCIFEROL) 1.25 MG (50000 UNIT) PO CAPS: 50000.0000 [IU] | ORAL_CAPSULE | ORAL | 3 refills | Status: DC

## 2023-11-12 ENCOUNTER — Ambulatory Visit: Payer: Self-pay

## 2023-11-12 NOTE — Telephone Encounter (Signed)
 Pt verbally agreed to go to [facility] or ED if her current medications does not get her through.   PT states that she will continue to take her Klonopin  & Zoloft and will seek medical advice locally if no help.

## 2023-11-12 NOTE — Telephone Encounter (Signed)
 Please call pt and advised to go to ER for evaluation.

## 2023-11-12 NOTE — Telephone Encounter (Signed)
 FYI Only or Action Required?: Action required by provider: update on patient condition.  Patient was last seen in primary care on 10/26/2023 by Abbey Bruckner, MD.  Called Nurse Triage reporting Panic Attack.  Symptoms began today.  Interventions attempted: Prescription medications: klonopin  (pt stated the Klonopin  was less effective.  Symptoms are: unchanged.  Triage Disposition: See PCP When Office is Open (Within 3 Days)- Pt is not in town and has an upcoming appt on 11/19/23  Patient/caregiver understands and will follow disposition?: Advised pt will forward to office. Pt is out of town. Advised pt to call 911 for any problems with talking, walking, vision changes, weakness or numbness to either side of body.        Copied from CRM [id number]. Topic: Clinical - Red Word Triage >> Nov 12, 2023  1:58 PM Franky GRADE wrote: Red Word that prompted transfer to Nurse Triage: Patient states she is having an emergency causing anxiety attacks, she did not want to disclose further. She is not in [place]  at the moment she is in [place] . Reason for Disposition  [1] Numbness or tingling in one or both hands AND [2] is a chronic symptom (recurrent or ongoing AND present > 4 weeks)  Answer Assessment - Initial Assessment Questions 1. SYMPTOM: What is the main symptom you are concerned about? (e.g., weakness, numbness)     Tingling hands (chronic) ,neck, shoulders- stated if she touches the back of her head it causes the tingling and hot and cold flashes. Whereas before those sx would come and go on their own 2. ONSET: When did this start? (e.g., minutes, hours, days; while sleeping)     Today  4. PATTERN Does this come and go, or has it been constant since it started?  Is it present now?     Comes and goes 5. CARDIAC SYMPTOMS: Have you had any of the following symptoms: chest pain, difficulty breathing, palpitations?     no 6. NEUROLOGIC SYMPTOMS: Have you had any of the following  symptoms: headache, dizziness, vision loss, double vision, changes in speech, unsteady on your feet?     Unsteady on feet , hot and cold sensation, dizziness,anxiety 7. OTHER SYMPTOMS: Do you have any other symptoms?     Sweating that comes and goes 8. PREGNANCY: Is there any chance you are pregnant? When was your last menstrual period?     N/a  Protocols used: Neurologic Deficit-A-AH

## 2023-11-15 NOTE — Telephone Encounter (Signed)
 Noted.  Nicole Mascot, MD

## 2023-11-18 ENCOUNTER — Other Ambulatory Visit: Payer: Self-pay

## 2023-11-18 DIAGNOSIS — K146 Glossodynia: Secondary | ICD-10-CM

## 2023-11-18 NOTE — Telephone Encounter (Signed)
 1. Burning mouth syndrome - PDMP reviewed, last refill for 60 tablets was on 10/14/23, refill appropriate.  - clonazePAM  (KLONOPIN ) 0.5 MG tablet; TAKE 2 TABLETS BY MOUTH DAILY AS NEEDED FOR ANXIETY  Dispense: 60 tablet; Refill: 0   Luke Shade, MD

## 2023-11-19 ENCOUNTER — Ambulatory Visit

## 2023-11-19 DIAGNOSIS — E538 Deficiency of other specified B group vitamins: Secondary | ICD-10-CM | POA: Diagnosis not present

## 2023-11-19 MED ORDER — CYANOCOBALAMIN 1000 MCG/ML IJ SOLN
1000.0000 ug | Freq: Once | INTRAMUSCULAR | Status: AC
  Administered 2023-11-19: 1000 ug via INTRAMUSCULAR

## 2023-11-19 NOTE — Progress Notes (Signed)
Pt received B12 injection in right deltoid muscle. Pt tolerated it well with no complaints or concerns.  

## 2023-12-16 ENCOUNTER — Other Ambulatory Visit: Payer: Self-pay | Admitting: Obstetrics and Gynecology

## 2023-12-16 ENCOUNTER — Encounter: Payer: Self-pay | Admitting: Obstetrics and Gynecology

## 2023-12-16 DIAGNOSIS — N6489 Other specified disorders of breast: Secondary | ICD-10-CM

## 2023-12-16 DIAGNOSIS — R928 Other abnormal and inconclusive findings on diagnostic imaging of breast: Secondary | ICD-10-CM

## 2023-12-18 ENCOUNTER — Other Ambulatory Visit: Payer: Self-pay | Admitting: Physician Assistant

## 2023-12-20 ENCOUNTER — Ambulatory Visit (INDEPENDENT_AMBULATORY_CARE_PROVIDER_SITE_OTHER)

## 2023-12-20 DIAGNOSIS — E538 Deficiency of other specified B group vitamins: Secondary | ICD-10-CM

## 2023-12-20 MED ORDER — CYANOCOBALAMIN 1000 MCG/ML IJ SOLN
1000.0000 ug | Freq: Once | INTRAMUSCULAR | Status: AC
  Administered 2023-12-20: 1000 ug via INTRAMUSCULAR

## 2023-12-20 NOTE — Progress Notes (Signed)
 Pt presented for their vitamin B12 injection. Pt was identified through two identifiers. Pt tolerated shot well in their left deltoid.

## 2023-12-23 DIAGNOSIS — E1169 Type 2 diabetes mellitus with other specified complication: Secondary | ICD-10-CM | POA: Diagnosis not present

## 2023-12-23 DIAGNOSIS — M792 Neuralgia and neuritis, unspecified: Secondary | ICD-10-CM | POA: Diagnosis not present

## 2023-12-23 DIAGNOSIS — E785 Hyperlipidemia, unspecified: Secondary | ICD-10-CM | POA: Diagnosis not present

## 2023-12-23 DIAGNOSIS — K146 Glossodynia: Secondary | ICD-10-CM | POA: Diagnosis not present

## 2023-12-23 DIAGNOSIS — F4310 Post-traumatic stress disorder, unspecified: Secondary | ICD-10-CM | POA: Diagnosis not present

## 2023-12-23 DIAGNOSIS — Z1331 Encounter for screening for depression: Secondary | ICD-10-CM | POA: Diagnosis not present

## 2023-12-23 DIAGNOSIS — F419 Anxiety disorder, unspecified: Secondary | ICD-10-CM | POA: Diagnosis not present
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
## 2024-01-03 ENCOUNTER — Ambulatory Visit
Admission: RE | Admit: 2024-01-03 | Discharge: 2024-01-03 | Disposition: A | Discharge status: Home / Self Care | Source: Ambulatory Visit

## 2024-01-03 ENCOUNTER — Ambulatory Visit

## 2024-01-03 VITALS — BP 137/84 | HR 82 | Temp 97.9°F | Resp 12 | Ht 62.25 in | Wt 173.2 lb

## 2024-01-03 DIAGNOSIS — B999 Unspecified infectious disease: Secondary | ICD-10-CM | POA: Diagnosis not present

## 2024-01-03 DIAGNOSIS — M797 Fibromyalgia: Secondary | ICD-10-CM | POA: Insufficient documentation

## 2024-01-03 DIAGNOSIS — M25551 Pain in right hip: Secondary | ICD-10-CM | POA: Diagnosis not present

## 2024-01-03 DIAGNOSIS — M25552 Pain in left hip: Secondary | ICD-10-CM | POA: Insufficient documentation

## 2024-01-03 DIAGNOSIS — R21 Rash and other nonspecific skin eruption: Secondary | ICD-10-CM | POA: Insufficient documentation

## 2024-01-03 DIAGNOSIS — M160 Bilateral primary osteoarthritis of hip: Secondary | ICD-10-CM | POA: Diagnosis not present

## 2024-01-03 DIAGNOSIS — G894 Chronic pain syndrome: Secondary | ICD-10-CM | POA: Diagnosis not present

## 2024-01-03 DIAGNOSIS — K146 Glossodynia: Secondary | ICD-10-CM | POA: Insufficient documentation

## 2024-01-03 NOTE — Progress Notes (Signed)
 Office Visit Note  Patient: Nicole Washington             Date of Birth: 1950-03-31           MRN: [id number]             PCP: Abbey Bruckner, MD Referring: Abbey Bruckner, MD Visit Date: 01/03/2024 Occupation: @GUAROCC @  Subjective:  New Patient (Initial Visit) (Joint Pain and stiffness)  Discussed the use of AI scribe software for clinical note transcription with the patient, who gave verbal consent to proceed.  History of Present Illness Brandan Callari is a 73 year old female with fibromyalgia who presents with chronic pain.  She experiences chronic pain and reports a history of fibromyalgia since her twenties. The pain is persistent and significantly affects her daily life. Over-the-counter medications like Tylenol , ibuprofen, and aspirin have been ineffective. She occasionally uses heat therapy, such as a warm rice bag, for relief.  She reports groin pain that began years ago and has progressed from occasional to constant. The pain worsens with prolonged sitting and feels like something is 'coming out of joint' when standing. It is severe enough to wake her at night and is the first sensation she feels in the morning. Additionally, she experiences back pain, not on the spine, which prevents her from sleeping on that side.  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 The burning sensation is intermittent and seems to have 'moods' similar to her fibromyalgia. Klonopin  is not taken daily.  She experiences tingling in her head, which sometimes extends to her neck and hands, occasionally triggering panic attacks. She is on pregabalin, which has helped reduce the head tingling but not completely resolved it. An EEG is scheduled for December 19th to further investigate these symptoms.  She reports hot flashes characterized by intense internal heat. These episodes can last for hours and occur unpredictably.  She has a family history of autoimmune diseases, with her  daughter and niece both diagnosed with lupus. She suspects her daughter also has Sjogren's syndrome and experiences symptoms like rashes and migraines. Her niece has Hashimoto's disease. She is concerned about a genetic predisposition to autoimmune diseases.  No rashes but small bumps on her cheeks, which her dermatologist questioned as rosacea. She reports dry mouth, managed by drinking water, but it does not always alleviate the dryness. No dry eyes, alopecia, sores in her mouth, significant photosensitivity, or issues with her hands turning colors.    Activities of Daily Living:  Patient reports morning stiffness for 24 hours.   Patient Reports nocturnal pain.  Difficulty dressing/grooming: Reports Difficulty climbing stairs: Denies Difficulty getting out of chair: Denies Difficulty using hands for taps, buttons, cutlery, and/or writing: Reports  Review of Systems  Constitutional:  Positive for fatigue.  HENT:  Negative for mouth sores and mouth dryness.   Eyes:  Negative for dryness.  Respiratory:  Positive for shortness of breath.   Cardiovascular:  Negative for chest pain and palpitations.  Gastrointestinal:  Negative for blood in stool, constipation and diarrhea.  Endocrine: Negative for increased urination.  Genitourinary:  Negative for involuntary urination.  Musculoskeletal:  Positive for joint pain, joint pain, morning stiffness and muscle tenderness. Negative for gait problem, joint swelling, myalgias, muscle weakness and myalgias.  Skin:  Positive for nodules/bumps. Negative for color change, rash, hair loss and sensitivity to sunlight.  Allergic/Immunologic: Negative for susceptible to infections.  Neurological:  Negative for dizziness and headaches.  Hematological:  Negative for swollen  glands.  Psychiatric/Behavioral:  Positive for depressed mood and sleep disturbance. The patient is nervous/anxious.     PMFS History:  Patient Active Problem List   Diagnosis Date Noted    B12 deficiency 07/29/2023   Encounter for diabetic foot exam (HCC) 07/22/2023   Neck pain 03/03/2023   Fibromyalgia 12/24/2020   Diabetes (HCC) 08/14/2020   Tremors of nervous system 11/01/2019   Obesity (BMI 30.0-34.9) 05/12/2019   Anxiety and depression 01/17/2019   Subclinical hypothyroidism 09/16/2018   GERD (gastroesophageal reflux disease) 09/12/2018   Tingling 06/21/2018   Weight gain due to medication 05/31/2018   Degenerative joint disease involving multiple joints on both sides of body 05/11/2018   Tendon nodule 11/04/2017   Memory difficulty 05/03/2017   CAD (coronary artery disease) 02/02/2017   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   Chronic low back pain 11/13/2016   Hypertension 10/27/2016   Hyperlipidemia associated with type 2 diabetes mellitus (HCC) 10/27/2016   PTSD (post-traumatic stress disorder) 10/27/2016    Past Medical History:  Diagnosis Date   Actinic keratosis    Adenomatous polyp of colon 08/27/2022   Basal cell carcinoma    Legs, L forehead, txted in past in [place]    Bronchitis 06/23/2021   Burning mouth syndrome    CAD (coronary artery disease)    Cancer (HCC)    basal and squamous cell   Depression    Fibromyalgia    GERD (gastroesophageal reflux disease)    Hyperlipidemia    Hypertension    IBS (irritable bowel syndrome)    Lump in neck 07/28/2019   Lymphadenopathy, cervical 11/25/2022   Pre-diabetes    Sleep apnea    CPAP   Squamous cell carcinoma of skin    R hand dorsum, L forehead, txted in [place]    Thyroid  disease     Family History  Problem Relation Age of Onset   Cerebral aneurysm Mother 68   Emphysema Father    Hypertension Sister    Depression Sister    Anxiety disorder Sister    Cancer Brother        ? colon   Heart Problems Paternal Grandfather    Migraines Daughter    Lupus Daughter    Sleep disorder Daughter    Irritable bowel syndrome Son    Lupus Other    Breast cancer Neg Hx    Past Surgical History:   Procedure Laterality Date   BREAST BIOPSY Right yrs ago   benign, marker placed   CATARACT EXTRACTION W/PHACO Left 02/23/2022   Procedure: CATARACT EXTRACTION PHACO AND INTRAOCULAR LENS PLACEMENT (IOC) LEFT  3.78  00:31.8;  Surgeon: Myrna Adine Anes, MD;  Location: [facility];  Service: Ophthalmology;  Laterality: Left;  Sleep apnea   CATARACT EXTRACTION W/PHACO Right 03/09/2022   Procedure: CATARACT EXTRACTION PHACO AND INTRAOCULAR LENS PLACEMENT (IOC) RIGHT  4.31  00:37.6;  Surgeon: Myrna Adine Anes, MD;  Location: [facility];  Service: Ophthalmology;  Laterality: Right;  Sleep apnea   CESAREAN SECTION     COLONOSCOPY WITH PROPOFOL  N/A 08/27/2022   Procedure: COLONOSCOPY WITH PROPOFOL ;  Surgeon: Therisa Bi, MD;  Location[facility];  Service: Gastroenterology;  Laterality: N/A;   POLYPECTOMY  08/27/2022   Procedure: POLYPECTOMY;  Surgeon: Therisa Bi, MD;  Location: [facility] ENDOSCOPY;  Service: Gastroenterology;;   Social History   Social History Narrative   Not on file   Immunization History  Administered Date(s) Administered   Fluad Quad(high Dose 65+) 11/01/2019, 12/24/2020, 01/21/2022   Fluad  Trivalent(High Dose 65+) 11/25/2022   Hep A / Hep B 11/25/2018, 12/21/2018   Hepatitis B 12/21/2018   INFLUENZA, HIGH DOSE SEASONAL PF 02/02/2017, 10/26/2023   Influenza,inj,Quad PF,6+ Mos 11/03/2017   PFIZER(Purple Top)SARS-COV-2 Vaccination 04/13/2019, 05/09/2019, 11/24/2019, 01/16/2020   Pfizer Covid-19 Vaccine Bivalent Booster 28yrs & up 01/16/2021   Pfizer(Comirnaty)Fall Seasonal Vaccine 12 years and older 10/30/2022   Pneumococcal Conjugate-13 11/03/2017   Pneumococcal Polysaccharide-23 09/18/2019   Tdap 05/01/2020   Zoster Recombinant(Shingrix) 05/01/2020, 07/27/2020     Objective: Vital Signs: BP 137/84 (BP Location: Left Arm, Patient Position: Sitting, Cuff Size: Small)   Pulse 82   Temp 97.9 F (36.6 C)   Resp 12   Ht 5' 2.25 (1.581 m)   Wt 173 lb  3.2 oz (78.6 kg)   BMI 31.42 kg/m    Physical Exam Vitals and nursing note reviewed.  HENT:     Head: Normocephalic and atraumatic.     Nose: Nose normal.  Eyes:     Conjunctiva/sclera: Conjunctivae normal.     Pupils: Pupils are equal, round, and reactive to light.  Cardiovascular:     Rate and Rhythm: Normal rate and regular rhythm.     Heart sounds: Normal heart sounds.  Pulmonary:     Effort: Pulmonary effort is normal.     Breath sounds: Normal breath sounds.  Musculoskeletal:     Comments: Passive abduction and adduction of b/l hips w/o pain; mild pain with b/l hip passive flexion. Pain with active flexion b/l hips.  Skin:    General: Skin is warm and dry.     Comments: Mild texture abnormality b/l checks, no significant erythema  Neurological:     Mental Status: She is alert. Mental status is at baseline.  Psychiatric:        Mood and Affect: Mood normal.        Behavior: Behavior normal.      Musculoskeletal Exam:   CDAI Exam: CDAI Score: -- Patient Global: --; Provider Global: -- Swollen: 0 ; Tender: 0  Joint Exam 01/03/2024   All documented joints were normal     Investigation: No additional findings.  Imaging: No results found.  Recent Labs: Lab Results  Component Value Date   WBC 8.2 11/25/2022   HGB 13.8 11/25/2022   PLT 358.0 11/25/2022   NA 138 10/26/2023   K 4.0 10/26/2023   CL 99 10/26/2023   CO2 31 10/26/2023   GLUCOSE 96 10/26/2023   BUN 21 10/26/2023   CREATININE 0.97 10/26/2023   BILITOT 0.5 10/26/2023   ALKPHOS 117 10/26/2023   AST 14 10/26/2023   ALT 13 10/26/2023   PROT 6.9 10/26/2023   ALBUMIN 4.3 10/26/2023   CALCIUM  9.3 10/26/2023   GFRAA 76 11/15/2018   Lab Results  Component Value Date   ANA Negative 11/15/2018    Speciality Comments: No specialty comments available.  Procedures:  No procedures performed Allergies: Codeine, Ivp dye [iodinated contrast media], and Pneumovax 23 [pneumococcal vac polyvalent]    Assessment / Plan:     Visit Diagnoses:  Fibromyalgia  Chronic pain syndrome  Patient with known dx of fibromyalgia w/ chronic widespread pain and allodynia. Fibromyalgia is a chronic pain disorder, thought to be a disorder of pain regulation, and often classified as a form of central sensitization. Fibromyalgia is characterized by widespread musculoskeletal pain, accompanied by fatigue, cognitive disturbances, psychiatric symptoms, headaches, paresthesia's, and multiple somatic symptoms. Fibromyalgia is not an inflammatory or autoimmune condition, and immunosuppressive therapy does not  treat fibromyalgia. Treatment is both nonpharmacological and pharmacological in nature, and should focus on sleep hygiene, graded exercise programs, pharmacologic therapy (options including TCA (amitriptyline), SNRIs (duloxetine, milnacipran), flexeril, gabapentin/lyrica, naltrexone, TENS), pyschosocial therapy i.e. CBT. The patient will likely benefit from multidisciplinary care by PCP, PT, integrative medicine, psychiatry, and pain management. These recommendations were discussed with the patient and will be sent to PCP. Referral also placed to Pain Clinic  Burning mouth syndrome  Rash and other nonspecific skin eruption  Patient with concerns given family hx of multiple autoimmune diseases. Given patients concerns and hx of abnormal rashes and sicca symptoms, will obtain serologies for SLE and SS. ANA not added to work-up, will call patient and have them report to [facility] for collection or will reach out to PCP to see if it can be done at upcoming follow-up in December. However, overall low suspicion given her current presentation.   Bilateral hip pain  Patient with b/l hip pain highly suggestive of underlying osteoarthritis. Will obtain DG HIPS BILAT WITH PELVIS 3-4 VIEWS today. Will likely proceed with referral to orthopedics pending results.  Orders: Orders Placed This Encounter  Procedures   DG HIPS BILAT  WITH PELVIS 3-4 VIEWS   Sjogren's syndrome antibods(ssa + ssb)   Anti-DNA antibody, double-stranded   Anti-Smith antibody   C3 and C4   Protein / creatinine ratio, urine   IgG, IgA, IgM   Ambulatory referral to Pain Clinic   No orders of the defined types were placed in this encounter.   I personally spent a total of 60 minutes in the care of the patient today including preparing to see the patient, getting/reviewing separately obtained history, performing a medically appropriate exam/evaluation, counseling and educating, placing orders, and documenting clinical information in the EHR.  Follow-Up Instructions: Return if symptoms worsen or fail to improve.   Asberry Claw, DO

## 2024-01-04 LAB — C3 AND C4
C3 Complement: 208 mg/dL — ABNORMAL HIGH (ref 83–193)
C4 Complement: 40 mg/dL (ref 15–57)

## 2024-01-04 LAB — ANTI-SMITH ANTIBODY: ENA SM Ab Ser-aCnc: <1 AI

## 2024-01-04 LAB — PROTEIN / CREATININE RATIO, URINE
Creatinine, Urine: 128 mg/dL (ref 20–275)
Protein/Creat Ratio: 102 mg/g{creat} (ref 24–184)
Protein/Creatinine Ratio: 0.102 mg/mg{creat} (ref 0.024–0.184)
Total Protein, Urine: 13 mg/dL (ref 5–24)

## 2024-01-04 LAB — SJOGREN'S SYNDROME ANTIBODS(SSA + SSB)
SSA (Ro) (ENA) Antibody, IgG: <1 AI
SSB (La) (ENA) Antibody, IgG: <1 AI

## 2024-01-04 LAB — ANTI-DNA ANTIBODY, DOUBLE-STRANDED: ds DNA Ab: <1 [IU]/mL

## 2024-01-04 LAB — IGG, IGA, IGM
IgG (Immunoglobin G), Serum: 925 mg/dL (ref 600–1540)
IgM, Serum: 52 mg/dL (ref 50–300)
Immunoglobulin A: 220 mg/dL (ref 70–320)

## 2024-01-04 NOTE — Telephone Encounter (Signed)
 open in error

## 2024-01-05 ENCOUNTER — Ambulatory Visit: Payer: Self-pay

## 2024-01-05 DIAGNOSIS — M160 Bilateral primary osteoarthritis of hip: Secondary | ICD-10-CM | POA: Diagnosis not present

## 2024-01-12 ENCOUNTER — Other Ambulatory Visit: Payer: Self-pay

## 2024-01-12 DIAGNOSIS — K146 Glossodynia: Secondary | ICD-10-CM

## 2024-01-12 NOTE — Telephone Encounter (Signed)
 1. Burning mouth syndrome - PDMP reviewed, refill appropriate  - clonazePAM  (KLONOPIN ) 0.5 MG tablet; Take 1 tablet (0.5 mg total) by mouth 2 (two) times daily as needed for anxiety. TAKE 2 TABLETS BY MOUTH DAILY AS NEEDED FOR ANXIETY  Dispense: 60 tablet; Refill: 0 Luke Shade, MD

## 2024-01-19 ENCOUNTER — Ambulatory Visit

## 2024-01-19 ENCOUNTER — Other Ambulatory Visit: Payer: Self-pay

## 2024-01-19 DIAGNOSIS — F411 Generalized anxiety disorder: Secondary | ICD-10-CM | POA: Diagnosis not present

## 2024-01-19 DIAGNOSIS — F39 Unspecified mood [affective] disorder: Secondary | ICD-10-CM | POA: Diagnosis not present

## 2024-01-19 DIAGNOSIS — E538 Deficiency of other specified B group vitamins: Secondary | ICD-10-CM

## 2024-01-19 DIAGNOSIS — F4312 Post-traumatic stress disorder, chronic: Secondary | ICD-10-CM | POA: Diagnosis not present

## 2024-01-19 DIAGNOSIS — F5105 Insomnia due to other mental disorder: Secondary | ICD-10-CM | POA: Diagnosis not present

## 2024-01-19 DIAGNOSIS — I10 Essential (primary) hypertension: Secondary | ICD-10-CM

## 2024-01-19 MED ORDER — CYANOCOBALAMIN 1000 MCG/ML IJ SOLN
1000.0000 ug | Freq: Once | INTRAMUSCULAR | Status: AC
  Administered 2024-01-19: 1000 ug via INTRAMUSCULAR

## 2024-01-19 NOTE — Progress Notes (Signed)
Pt received B12 injection in right deltoid muscle. Pt tolerated it well with no complaints or concerns.  

## 2024-01-20 DIAGNOSIS — M47816 Spondylosis without myelopathy or radiculopathy, lumbar region: Secondary | ICD-10-CM | POA: Diagnosis not present

## 2024-01-20 DIAGNOSIS — M25552 Pain in left hip: Secondary | ICD-10-CM | POA: Diagnosis not present

## 2024-01-20 DIAGNOSIS — M797 Fibromyalgia: Secondary | ICD-10-CM | POA: Diagnosis not present

## 2024-02-03 ENCOUNTER — Inpatient Hospital Stay
Admission: RE | Admit: 2024-02-03 | Discharge: 2024-02-03 | Attending: Obstetrics and Gynecology | Admitting: Obstetrics and Gynecology

## 2024-02-03 ENCOUNTER — Inpatient Hospital Stay: Admission: RE | Admit: 2024-02-03

## 2024-02-03 DIAGNOSIS — R928 Other abnormal and inconclusive findings on diagnostic imaging of breast: Secondary | ICD-10-CM

## 2024-02-03 DIAGNOSIS — N6489 Other specified disorders of breast: Secondary | ICD-10-CM

## 2024-02-07 DIAGNOSIS — G8929 Other chronic pain: Secondary | ICD-10-CM

## 2024-02-07 MED ORDER — HYDROCODONE-ACETAMINOPHEN 5-325 MG PO TABS: 1.0000 | ORAL_TABLET | Freq: Every day | ORAL | 0 refills | Status: AC | PRN

## 2024-02-07 NOTE — Telephone Encounter (Signed)
 1. Chronic bilateral low back pain without sciatica (Primary) - Defer to mychart message from today.  - HYDROcodone -acetaminophen  (NORCO/VICODIN) 5-325 MG tablet; Take 1 tablet by mouth daily as needed for up to 5 days for severe pain (pain score 7-10).  Dispense: 5 tablet; Refill: 0  Luke Shade, MD

## 2024-02-23 ENCOUNTER — Other Ambulatory Visit: Payer: Self-pay

## 2024-02-23 ENCOUNTER — Ambulatory Visit

## 2024-02-23 VITALS — BP 110/64 | HR 89 | Temp 98.0°F | Ht 64.0 in | Wt 174.8 lb

## 2024-02-23 DIAGNOSIS — I10 Essential (primary) hypertension: Secondary | ICD-10-CM | POA: Diagnosis not present

## 2024-02-23 DIAGNOSIS — E538 Deficiency of other specified B group vitamins: Secondary | ICD-10-CM | POA: Diagnosis not present

## 2024-02-23 DIAGNOSIS — G6289 Other specified polyneuropathies: Secondary | ICD-10-CM

## 2024-02-23 DIAGNOSIS — K146 Glossodynia: Secondary | ICD-10-CM

## 2024-02-23 DIAGNOSIS — E785 Hyperlipidemia, unspecified: Secondary | ICD-10-CM | POA: Diagnosis not present

## 2024-02-23 DIAGNOSIS — F419 Anxiety disorder, unspecified: Secondary | ICD-10-CM | POA: Diagnosis not present

## 2024-02-23 DIAGNOSIS — E1169 Type 2 diabetes mellitus with other specified complication: Secondary | ICD-10-CM | POA: Diagnosis not present

## 2024-02-23 DIAGNOSIS — M797 Fibromyalgia: Secondary | ICD-10-CM | POA: Diagnosis not present

## 2024-02-23 DIAGNOSIS — G629 Polyneuropathy, unspecified: Secondary | ICD-10-CM | POA: Insufficient documentation

## 2024-02-23 DIAGNOSIS — R928 Other abnormal and inconclusive findings on diagnostic imaging of breast: Secondary | ICD-10-CM | POA: Diagnosis not present

## 2024-02-23 DIAGNOSIS — E559 Vitamin D deficiency, unspecified: Secondary | ICD-10-CM

## 2024-02-23 DIAGNOSIS — E119 Type 2 diabetes mellitus without complications: Secondary | ICD-10-CM

## 2024-02-23 DIAGNOSIS — E782 Mixed hyperlipidemia: Secondary | ICD-10-CM | POA: Diagnosis not present

## 2024-02-23 DIAGNOSIS — F32A Depression, unspecified: Secondary | ICD-10-CM

## 2024-02-23 MED ORDER — VITAMIN B-12 1000 MCG PO TABS: 1000.0000 ug | ORAL_TABLET | Freq: Every day | ORAL | 3 refills | Status: AC

## 2024-02-23 MED ORDER — ATORVASTATIN CALCIUM 80 MG PO TABS: 80.0000 mg | ORAL_TABLET | Freq: Every day | ORAL | 3 refills | Status: AC

## 2024-02-23 MED ORDER — AMLODIPINE BESYLATE 2.5 MG PO TABS: 2.5000 mg | ORAL_TABLET | Freq: Every day | ORAL | 1 refills | Status: AC

## 2024-02-23 NOTE — Assessment & Plan Note (Addendum)
 History of vitamin D  deficiency, s/p treatment with vitamin D . Repeat vitamin D  level today to assure normalization.  Orders:   Vitamin D  (25 hydroxy)

## 2024-02-23 NOTE — Assessment & Plan Note (Addendum)
 Patient has seen rheumatologist in the past. Referred to pain clinic by rheumatologist but has not heard from them for an appointment. Encourage patient to give pain clinic a call back.  She is also seeing orthopedic specialist Dr. Obazabo for left hip pain. Exacerbation of  lower back pain, left hip pain. Last flare up in 01/2024, was sent sent Norco 5-325 mg (5 tab), she is cautious with narcotics and has 2 tablets left. Given her co-morbidities, current medications we discussed risks and benefits and interactions of current medications and recommend against chronic use of opoid if possible.

## 2024-02-23 NOTE — Patient Instructions (Addendum)
 Reduce Amlodipine  from 5 mg daily to 2.5 mg daily. Please continue to check home blood pressure 2-3 times a week.

## 2024-02-23 NOTE — Assessment & Plan Note (Addendum)
 Chronic issue.   Continue follow up and management per psychiatry. She kindly declined to fill out [id number] today.

## 2024-02-23 NOTE — Assessment & Plan Note (Addendum)
 On Ozempic  1 mg weekly, continue. Check A1c. Discussed reducing sugar intake.  Orders:   HgB A1c   Urine Microalbumin w/creat. ratio

## 2024-02-23 NOTE — Assessment & Plan Note (Addendum)
 Managed with prn Clonazepam  0.5 mg BID prn, continue.

## 2024-02-23 NOTE — Assessment & Plan Note (Addendum)
 Goal LDL <70. Continue Atorvastatin  80 mg daily.

## 2024-02-23 NOTE — Assessment & Plan Note (Addendum)
 Home BP reviewed, on low normal range with range SBP: 99-115 mmHg and DBP 60-73 mmHg.  Reduced amlodipine  from 5 mg to 2.5 mg daily. New prescription sent to the pharmacy. Continue carvedilol  3.125 mg BID, Losartan  50 mg BID. Continue home BP check 2-3 times a day. Consider d/c Amlodipine  if BP continues to be on low normal side.  F/U in 6 months.    Orders:   amLODipine  (NORVASC ) 2.5 MG tablet; Take 1 tablet (2.5 mg total) by mouth daily.   Urine Microalbumin w/creat. ratio

## 2024-02-23 NOTE — Progress Notes (Signed)
 "  Established Patient Office Visit   Subjective  Patient ID: Nicole Washington, female    DOB: [birth year]  Age: 74 y.o. MRN: [id number]  Chief Complaint  Patient presents with   Diabetes   Hyperlipidemia   Hypertension    jhhhh   Thyroid  Problem    Discussed the use of AI scribe software for clinical note transcription with the patient, who gave verbal consent to proceed.  History of Present Illness Nicole Washington is a 74 year old female with chronic back pain and neuropathy who presents for pain management and follow-up.  She has experienced persistent back pain for a long time, with worsening symptoms over the past year. The pain is described as feeling like something 'went out of joint' and is very painful. She is scheduled for a pain management injection on January 17th in [place]. Pain medication is used sparingly, only when 'desperate'.  She also experiences a separate pain she believes is nerve-related, located in her back but not radiating down her leg. This pain causes significant discomfort at night, affecting her sleep as she moves to find relief.   She has a history of neuropathy, confirmed by Dr. Maree, causing tingling sensations, particularly in her head. Lyrica has not been effective in managing these symptoms. An EEG was performed on December 19th, but results are pending.  She experiences tingling and discomfort in her head, sometimes leading to breakouts on her scalp. A prescribed shampoo from her dermatologist, Dr. Jackquline, has helped in the past. A follow-up with Dr. Jackquline is planned for February.  Her blood pressure has been fluctuating, with recent readings on the low side, such as 99/60. She has been monitoring her blood pressure. She takes amlodipine , which is being reduced from 5 mg to 2.5 mg.  She takes several medications including clonazepam  occasionally for tingling, duloxetine 120 mg once daily, and Lipitor 80 mg. She also takes Coreg  twice a day and  amitriptyline 10 mg, prescribed by Dr. Dagoberto Marshall.  She has a history of prediabetes and is on semaglutide  injections weekly for weight management, though she reports no significant weight loss. She also receives B12 injections, with the last one in December, and is transitioning to oral B12 supplementation.  She underwent a mammogram recently, which did not show concerning findings, but she is scheduled for a repeat in six months. She also had a colonoscopy with a follow-up scheduled in three years due to a previous finding.  She expresses frustration with the management of her pain, particularly with fibromyalgia, and has had difficulty connecting with a pain management specialist despite referrals.    ROS As per HPI    Objective:     BP 110/64 (BP Location: Right Arm, Patient Position: Sitting)   Pulse 89   Temp 98 F (36.7 C) (Oral)   Ht 5' 4 (1.626 m)   Wt 174 lb 12.8 oz (79.3 kg)   SpO2 94%   BMI 30.00 kg/m      10/26/2023    2:08 PM 07/22/2023    2:09 PM 03/18/2023    2:07 PM  Depression screen PHQ 2/9  Decreased Interest 0 0 1  Down, Depressed, Hopeless 0 0 0  PHQ - 2 Score 0 0 1  Altered sleeping 0 0 0  Tired, decreased energy 0 0 0  Change in appetite 0 0 0  Feeling bad or failure about yourself  0 0 0  Trouble concentrating 0 0 0  Moving slowly or fidgety/restless 0 0  0  Suicidal thoughts 0 0 0  PHQ-9 Score 0  0  1   Difficult doing work/chores Not difficult at all Not difficult at all Not difficult at all     Data saved with a previous flowsheet row definition      10/26/2023    2:08 PM 07/22/2023    2:10 PM 03/18/2023    2:08 PM 03/03/2023   11:18 AM  GAD 7 : Generalized Anxiety Score  Nervous, Anxious, on Edge 0 0 1 1  Control/stop worrying 0 0 1 0  Worry too much - different things 0 0 1 1  Trouble relaxing  0 0 0  Restless 0 0 0 0  Easily annoyed or irritable 0 0 0 0  Afraid - awful might happen 0 0 0 0  Total GAD 7 Score  0 3 2  Anxiety Difficulty  Not difficult at all Not difficult at all Not difficult at all Not difficult at all      10/26/2023    2:08 PM 07/22/2023    2:09 PM 03/18/2023    2:07 PM  Depression screen PHQ 2/9  Decreased Interest 0 0 1  Down, Depressed, Hopeless 0 0 0  PHQ - 2 Score 0 0 1  Altered sleeping 0 0 0  Tired, decreased energy 0 0 0  Change in appetite 0 0 0  Feeling bad or failure about yourself  0 0 0  Trouble concentrating 0 0 0  Moving slowly or fidgety/restless 0 0 0  Suicidal thoughts 0 0 0  PHQ-9 Score 0  0  1   Difficult doing work/chores Not difficult at all Not difficult at all Not difficult at all     Data saved with a previous flowsheet row definition      10/26/2023    2:08 PM 07/22/2023    2:10 PM 03/18/2023    2:08 PM 03/03/2023   11:18 AM  GAD 7 : Generalized Anxiety Score  Nervous, Anxious, on Edge 0 0 1 1  Control/stop worrying 0 0 1 0  Worry too much - different things 0 0 1 1  Trouble relaxing  0 0 0  Restless 0 0 0 0  Easily annoyed or irritable 0 0 0 0  Afraid - awful might happen 0 0 0 0  Total GAD 7 Score  0 3 2  Anxiety Difficulty Not difficult at all Not difficult at all Not difficult at all Not difficult at all   SDOH Screenings   Food Insecurity: No Food Insecurity (02/23/2024)  Housing: Unknown (02/23/2024)  Transportation Needs: No Transportation Needs (02/23/2024)  Alcohol Screen: Low Risk (10/22/2023)  Depression (PHQ2-9): Low Risk (10/26/2023)  Financial Resource Strain: Low Risk (02/23/2024)  Physical Activity: Inactive (02/23/2024)  Social Connections: Moderately Integrated (02/23/2024)  Stress: Patient Declined (02/23/2024)  Tobacco Use: Medium Risk (02/23/2024)     Physical Exam Constitutional:      General: She is not in acute distress. HENT:     Head: Normocephalic and atraumatic.     Mouth/Throat:     Mouth: Mucous membranes are moist.  Cardiovascular:     Rate and Rhythm: Normal rate.  Pulmonary:     Breath sounds: No wheezing.  Abdominal:     General: Bowel  sounds are normal.     Palpations: Abdomen is soft.     Tenderness: There is no guarding.  Musculoskeletal:     Cervical back: Neck supple.     Right lower leg: No  edema.     Left lower leg: No edema.  Lymphadenopathy:     Cervical: No cervical adenopathy.  Skin:    General: Skin is warm.  Neurological:     Mental Status: She is alert and oriented to person, place, and time.  Psychiatric:        Mood and Affect: Mood normal.        No results found for any visits on 02/23/24.  The 10-year ASCVD risk score (Arnett DK, et al., 2019) is: 25.3%     Assessment & Plan:  Recommend f/u with Dr. Jackquline for scalp lesions, restart ketoconazole  shampoo as prescribed by dermatologist.  Assessment & Plan B12 deficiency Plan to transition from injections to oral supplementation, will check B12 today. Stop B12 IM injection, start 1000 mcg daily B12 supplement, prescription sent. Repeat B12 in 6 months.    Orders:   B12 and Folate Panel  Hyperlipidemia associated with type 2 diabetes mellitus (HCC) Goal LDL <70. Continue Atorvastatin  80 mg daily.      Type 2 diabetes mellitus without complication, without long-term current use of insulin (HCC) On Ozempic  1 mg weekly, continue. Check A1c. Discussed reducing sugar intake.  Orders:   HgB A1c   Urine Microalbumin w/creat. ratio  Primary hypertension Home BP reviewed, on low normal range with range SBP: 99-115 mmHg and DBP 60-73 mmHg.  Reduced amlodipine  from 5 mg to 2.5 mg daily. New prescription sent to the pharmacy. Continue carvedilol  3.125 mg BID, Losartan  50 mg BID. Continue home BP check 2-3 times a day. Consider d/c Amlodipine  if BP continues to be on low normal side.  F/U in 6 months.    Orders:   amLODipine  (NORVASC ) 2.5 MG tablet; Take 1 tablet (2.5 mg total) by mouth daily.   Urine Microalbumin w/creat. ratio  Vitamin D  deficiency History of vitamin D  deficiency, s/p treatment with vitamin D . Repeat vitamin D  level  today to assure normalization.  Orders:   Vitamin D  (25 hydroxy)  Fibromyalgia Patient has seen rheumatologist in the past. Referred to pain clinic by rheumatologist but has not heard from them for an appointment. Encourage patient to give pain clinic a call back.  She is also seeing orthopedic specialist Dr. Obazabo for left hip pain. Exacerbation of  lower back pain, left hip pain. Last flare up in 01/2024, was sent sent Norco 5-325 mg (5 tab), she is cautious with narcotics and has 2 tablets left. Given her co-morbidities, current medications we discussed risks and benefits and interactions of current medications and recommend against chronic use of opoid if possible.      Anxiety and depression Chronic issue.   Continue follow up and management per psychiatry. She kindly declined to fill out [id number] today.       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       Other polyneuropathy Tingling and discomfort of scalp, neck, back. Continue f/u with neurologist Dr. Posey, symptoms still persistent despite taking Lyrica 200 mg BID.    Mixed hyperlipidemia  Orders:   atorvastatin  (LIPITOR) 80 MG tablet; Take 1 tablet (80 mg total) by mouth daily.  Abnormal mammogram RIGHT breast asymmetry, 6 M diagnostic mammogram recommended in 02/03/24, future imaging ordered.  Orders:   MM 3D DIAGNOSTIC MAMMOGRAM BILATERAL BREAST; Future  I personally spent a total of 45 minutes in the care of the patient today including preparing to see the patient, getting/reviewing separately obtained history, performing a medically appropriate exam/evaluation,  counseling and educating, placing orders, documenting clinical information in the EHR, independently interpreting results, and communicating results.  Return in about 6 months (around 08/22/2024) for Medicare wellness visit with nurse Genette). F/U with Dr. Abbey in 6 months or sooner if needed.   Luke Abbey, MD "

## 2024-02-23 NOTE — Assessment & Plan Note (Addendum)
 Tingling and discomfort of scalp, neck, back. Continue f/u with neurologist Dr. Posey, symptoms still persistent despite taking Lyrica 200 mg BID.

## 2024-02-23 NOTE — Assessment & Plan Note (Addendum)
 Plan to transition from injections to oral supplementation, will check B12 today. Stop B12 IM injection, start 1000 mcg daily B12 supplement, prescription sent. Repeat B12 in 6 months.    Orders:   B12 and Folate Panel

## 2024-02-24 LAB — MICROALBUMIN / CREATININE URINE RATIO
Creatinine,U: 59.9 mg/dL
Microalb Creat Ratio: UNDETERMINED mg/g (ref 0.0–30.0)
Microalb, Ur: <0.7 mg/dL

## 2024-02-24 LAB — B12 AND FOLATE PANEL
Folate: 8.4 ng/mL
Vitamin B-12: 510 pg/mL (ref 211–911)

## 2024-02-24 LAB — HEMOGLOBIN A1C: Hgb A1c MFr Bld: 6.3 % (ref 4.6–6.5)

## 2024-02-24 LAB — VITAMIN D 25 HYDROXY (VIT D DEFICIENCY, FRACTURES): VITD: 25.03 ng/mL — ABNORMAL LOW (ref 30.00–100.00)

## 2024-02-25 ENCOUNTER — Ambulatory Visit: Payer: Self-pay

## 2024-02-25 DIAGNOSIS — I10 Essential (primary) hypertension: Secondary | ICD-10-CM

## 2024-02-25 DIAGNOSIS — E559 Vitamin D deficiency, unspecified: Secondary | ICD-10-CM

## 2024-02-25 DIAGNOSIS — E538 Deficiency of other specified B group vitamins: Secondary | ICD-10-CM

## 2024-02-25 DIAGNOSIS — E119 Type 2 diabetes mellitus without complications: Secondary | ICD-10-CM

## 2024-02-25 MED ORDER — VITAMIN D (ERGOCALCIFEROL) 1.25 MG (50000 UNIT) PO CAPS: 50000.0000 [IU] | ORAL_CAPSULE | ORAL | 0 refills | Status: AC

## 2024-02-25 NOTE — Progress Notes (Signed)
 1. Vitamin D  deficiency (Primary) - Vitamin D , Ergocalciferol , (DRISDOL ) 1.25 MG (50000 UNIT) CAPS capsule; Take 1 capsule (50,000 Units total) by mouth every 7 (seven) days.  Dispense: 12 capsule; Refill: 0 - Vitamin D  (25 hydroxy); Future  2. B12 deficiency - B12; Future  3. Primary hypertension - Basic Metabolic Panel (BMET); Future  4. Type 2 diabetes mellitus without complication, without long-term current use of insulin (HCC) - HgB A1c; Future  Luke Shade, MD

## 2024-03-02 ENCOUNTER — Other Ambulatory Visit: Payer: Self-pay

## 2024-03-02 DIAGNOSIS — K219 Gastro-esophageal reflux disease without esophagitis: Secondary | ICD-10-CM

## 2024-03-21 ENCOUNTER — Encounter: Admitting: Dermatology

## 2024-03-21 ENCOUNTER — Encounter: Payer: Self-pay | Admitting: Dermatology

## 2024-03-21 DIAGNOSIS — Z85828 Personal history of other malignant neoplasm of skin: Secondary | ICD-10-CM

## 2024-03-21 DIAGNOSIS — Z1283 Encounter for screening for malignant neoplasm of skin: Secondary | ICD-10-CM | POA: Diagnosis not present

## 2024-03-21 DIAGNOSIS — L853 Xerosis cutis: Secondary | ICD-10-CM

## 2024-03-21 DIAGNOSIS — L578 Other skin changes due to chronic exposure to nonionizing radiation: Secondary | ICD-10-CM | POA: Diagnosis not present

## 2024-03-21 DIAGNOSIS — D229 Melanocytic nevi, unspecified: Secondary | ICD-10-CM

## 2024-03-21 DIAGNOSIS — L720 Epidermal cyst: Secondary | ICD-10-CM | POA: Diagnosis not present

## 2024-03-21 DIAGNOSIS — L821 Other seborrheic keratosis: Secondary | ICD-10-CM

## 2024-03-21 DIAGNOSIS — L814 Other melanin hyperpigmentation: Secondary | ICD-10-CM

## 2024-03-21 DIAGNOSIS — W908XXA Exposure to other nonionizing radiation, initial encounter: Secondary | ICD-10-CM | POA: Diagnosis not present

## 2024-03-21 DIAGNOSIS — L820 Inflamed seborrheic keratosis: Secondary | ICD-10-CM

## 2024-03-21 DIAGNOSIS — D1801 Hemangioma of skin and subcutaneous tissue: Secondary | ICD-10-CM

## 2024-03-21 DIAGNOSIS — L739 Follicular disorder, unspecified: Secondary | ICD-10-CM | POA: Diagnosis not present

## 2024-03-21 MED ORDER — CLOBETASOL PROPIONATE 0.05 % EX SOLN: CUTANEOUS | 3 refills | Status: AC

## 2024-03-21 MED ORDER — KETOCONAZOLE 2 % EX SHAM: MEDICATED_SHAMPOO | CUTANEOUS | 11 refills | Status: AC

## 2024-03-27 ENCOUNTER — Ambulatory Visit: Admitting: Pain Medicine

## 2024-08-08 ENCOUNTER — Encounter

## 2024-08-22 ENCOUNTER — Ambulatory Visit

## 2024-08-23 ENCOUNTER — Ambulatory Visit

## 2025-03-27 ENCOUNTER — Ambulatory Visit: Admitting: Dermatology
